# Patient Record
Sex: Male | Born: 1970 | Race: White | Hispanic: No | Marital: Married | Smoking: Former smoker
Health system: Southern US, Community
[De-identification: ages and names within clinical notes are randomized; demographics above are authoritative.]

## PROBLEM LIST (undated history)

## (undated) DIAGNOSIS — K219 Gastro-esophageal reflux disease without esophagitis: Secondary | ICD-10-CM

## (undated) DIAGNOSIS — E119 Type 2 diabetes mellitus without complications: Secondary | ICD-10-CM

## (undated) DIAGNOSIS — I10 Essential (primary) hypertension: Secondary | ICD-10-CM

## (undated) DIAGNOSIS — E7800 Pure hypercholesterolemia, unspecified: Secondary | ICD-10-CM

## (undated) DIAGNOSIS — K449 Diaphragmatic hernia without obstruction or gangrene: Secondary | ICD-10-CM

## (undated) DIAGNOSIS — K633 Ulcer of intestine: Secondary | ICD-10-CM

## (undated) DIAGNOSIS — F419 Anxiety disorder, unspecified: Secondary | ICD-10-CM

## (undated) HISTORY — DX: Ulcer of intestine: K63.3

## (undated) HISTORY — PX: UPPER GASTROINTESTINAL ENDOSCOPY: SHX188

## (undated) HISTORY — DX: Pure hypercholesterolemia, unspecified: E78.00

## (undated) HISTORY — DX: Type 2 diabetes mellitus without complications: E11.9

## (undated) HISTORY — DX: Gastro-esophageal reflux disease without esophagitis: K21.9

## (undated) HISTORY — PX: OTHER SURGICAL HISTORY: SHX169

## (undated) HISTORY — PX: WISDOM TOOTH EXTRACTION: SHX21

## (undated) HISTORY — DX: Essential (primary) hypertension: I10

## (undated) HISTORY — PX: EYE SURGERY: SHX253

## (undated) HISTORY — DX: Diaphragmatic hernia without obstruction or gangrene: K44.9

## (undated) HISTORY — DX: Anxiety disorder, unspecified: F41.9

## (undated) HISTORY — PX: POLYPECTOMY: SHX149

## (undated) HISTORY — PX: TONSILLECTOMY AND ADENOIDECTOMY: SUR1326

---

## 2011-06-19 ENCOUNTER — Emergency Department (HOSPITAL_COMMUNITY)
Admission: EM | Admit: 2011-06-19 | Discharge: 2011-06-19 | Disposition: A | Discharge status: Home / Self Care | Payer: Self-pay | Attending: Emergency Medicine | Admitting: Emergency Medicine

## 2011-06-19 ENCOUNTER — Other Ambulatory Visit: Payer: Self-pay

## 2011-06-19 ENCOUNTER — Encounter (HOSPITAL_COMMUNITY): Payer: Self-pay | Admitting: Emergency Medicine

## 2011-06-19 ENCOUNTER — Emergency Department (HOSPITAL_COMMUNITY): Payer: Self-pay

## 2011-06-19 DIAGNOSIS — F1010 Alcohol abuse, uncomplicated: Secondary | ICD-10-CM | POA: Insufficient documentation

## 2011-06-19 DIAGNOSIS — J45909 Unspecified asthma, uncomplicated: Secondary | ICD-10-CM | POA: Insufficient documentation

## 2011-06-19 DIAGNOSIS — R55 Syncope and collapse: Secondary | ICD-10-CM | POA: Insufficient documentation

## 2011-06-19 DIAGNOSIS — R112 Nausea with vomiting, unspecified: Secondary | ICD-10-CM | POA: Insufficient documentation

## 2011-06-19 DIAGNOSIS — Z79899 Other long term (current) drug therapy: Secondary | ICD-10-CM | POA: Insufficient documentation

## 2011-06-19 DIAGNOSIS — I10 Essential (primary) hypertension: Secondary | ICD-10-CM | POA: Insufficient documentation

## 2011-06-19 DIAGNOSIS — R404 Transient alteration of awareness: Secondary | ICD-10-CM | POA: Insufficient documentation

## 2011-06-19 LAB — CBC
HCT: 40.7 % (ref 39.0–52.0)
MCHC: 35.1 g/dL (ref 30.0–36.0)
MCV: 87.7 fL (ref 78.0–100.0)
RDW: 13.5 % (ref 11.5–15.5)

## 2011-06-19 LAB — DIFFERENTIAL
Basophils Absolute: 0 10*3/uL (ref 0.0–0.1)
Basophils Relative: 0 % (ref 0–1)
Eosinophils Relative: 0 % (ref 0–5)
Monocytes Absolute: 0.4 10*3/uL (ref 0.1–1.0)
Neutro Abs: 9.2 10*3/uL — ABNORMAL HIGH (ref 1.7–7.7)

## 2011-06-19 LAB — BASIC METABOLIC PANEL
Calcium: 8.4 mg/dL (ref 8.4–10.5)
Creatinine, Ser: 0.9 mg/dL (ref 0.50–1.35)
GFR calc Af Amer: >90 mL/min (ref >90)
GFR calc non Af Amer: >90 mL/min (ref >90)

## 2011-06-19 LAB — TROPONIN I: Troponin I: <0.3 ng/mL (ref <0.30)

## 2011-06-19 MED ORDER — ONDANSETRON HCL 4 MG/2ML IJ SOLN
INTRAMUSCULAR | Status: AC
  Filled 2011-06-19: qty 2

## 2011-06-19 MED ORDER — PROMETHAZINE HCL 25 MG/ML IJ SOLN
25.0000 mg | Freq: Once | INTRAMUSCULAR | Status: AC
  Administered 2011-06-19: 25 mg via INTRAVENOUS
  Filled 2011-06-19: qty 1

## 2011-06-19 MED ORDER — METOCLOPRAMIDE HCL 10 MG PO TABS: 10.0000 mg | ORAL_TABLET | Freq: Four times a day (QID) | ORAL | Status: DC

## 2011-06-19 MED ORDER — ONDANSETRON HCL 4 MG/2ML IJ SOLN
INTRAMUSCULAR | Status: AC
  Administered 2011-06-19: 4 mg
  Filled 2011-06-19: qty 2

## 2011-06-19 MED ORDER — SODIUM CHLORIDE 0.9 % IV BOLUS (SEPSIS)
1000.0000 mL | Freq: Once | INTRAVENOUS | Status: AC
  Administered 2011-06-19: 1000 mL via INTRAVENOUS

## 2011-06-19 MED ORDER — ONDANSETRON 4 MG PO TBDP: 4.0000 mg | ORAL_TABLET | Freq: Three times a day (TID) | ORAL | Status: DC | PRN

## 2011-06-19 MED ORDER — METOCLOPRAMIDE HCL 10 MG PO TABS: 10.0000 mg | ORAL_TABLET | Freq: Four times a day (QID) | ORAL | Status: AC

## 2011-06-19 MED ORDER — ONDANSETRON 4 MG PO TBDP: 4.0000 mg | ORAL_TABLET | Freq: Three times a day (TID) | ORAL | Status: AC | PRN

## 2011-06-19 NOTE — ED Notes (Signed)
Patient at buddies home, drinking and smoking cigars.  Patient states that he drank too much, smoked a cigar and now nauseated and vomiting.  Patient is awake, alert to name and place.  Patient smells of ETOH.

## 2011-06-19 NOTE — ED Notes (Signed)
Registration at [facility], wife into room.

## 2011-06-19 NOTE — ED Notes (Signed)
Patient complaining of nausea at this time.  MD notified.  New orders per Dr Hyacinth Meeker.

## 2011-06-19 NOTE — ED Provider Notes (Signed)
History     CSN: [id number]  Arrival date & time 06/19/11  0321   First MD Initiated Contact with Patient 06/19/11 [phone]      Chief Complaint  Patient presents with  . Alcohol Intoxication  . Loss of Consciousness    (Consider location/radiation/quality/duration/timing/severity/associated sxs/prior treatment) HPI Comments: 5 atrial male with a history of asthma and hypertension presents with a complaint of nausea and syncope. According to the patient he was drinking moonshine with his friends watching a basketball game and smoking a cigar when onlookers stated that he became nauseated vomited and had a syncopal episode. Currently he is awake and alert and complains only of persistent nausea. He denies chest pain, shortness of breath, swelling of the legs, abdominal pain, back pain, rash, headache or change in vision. He denies weakness or numbness. He has had no diarrhea. Symptoms were acute in onset, no one else at his part he felt similar symptoms.  The patient does report that he has been on an antibiotic for the last 2 days because of a chest cold.  Patient is a 41 y.o. male presenting with intoxication and syncope. The history is provided by the patient and the spouse.  Alcohol Intoxication  Loss of Consciousness    Past Medical History  Diagnosis Date  . Asthma     History reviewed. No pertinent past surgical history.  History reviewed. No pertinent family history.  History  Substance Use Topics  . Smoking status: Current Some Day Smoker    Types: Cigars, Cigarettes  . Smokeless tobacco: Not on file  . Alcohol Use: Yes      Review of Systems  Cardiovascular: Positive for syncope.  All other systems reviewed and are negative.    Allergies  Review of patient's allergies indicates no known allergies.  Home Medications   Current Outpatient Rx  Name Route Sig Dispense Refill  . ALPRAZOLAM 0.25 MG PO TABS Oral Take 0.25 mg by mouth daily as needed. For anxiety.     Marland Kitchen BENZONATATE 100 MG PO CAPS Oral Take 100-200 mg by mouth 3 (three) times daily as needed. For cough.    Marland Kitchen FLUTICASONE-SALMETEROL 45-21 MCG/ACT IN AERO Inhalation Inhale 2 puffs into the lungs 2 (two) times daily.    Marland Kitchen LISINOPRIL 40 MG PO TABS Oral Take 40 mg by mouth daily.    Marland Kitchen PANTOPRAZOLE SODIUM 40 MG PO TBEC Oral Take 40 mg by mouth 2 (two) times daily.    . SUCRALFATE 1 G PO TABS Oral Take 1 g by mouth 3 (three) times daily.    Marland Kitchen METOCLOPRAMIDE HCL 10 MG PO TABS Oral Take 1 tablet (10 mg total) by mouth every 6 (six) hours. 30 tablet 0  . ONDANSETRON 4 MG PO TBDP Oral Take 1 tablet (4 mg total) by mouth every 8 (eight) hours as needed for nausea. 10 tablet 0    BP 109/71  Pulse 83  Temp(Src) 97.5 F (36.4 C) (Oral)  Resp 13  Ht 6\' 1"  (1.854 m)  Wt 285 lb (129.275 kg)  BMI 37.60 kg/m2  SpO2 100%  Physical Exam  Nursing note and vitals reviewed. Constitutional: He appears well-developed and well-nourished. No distress.  HENT:  Head: Normocephalic and atraumatic.  Mouth/Throat: No oropharyngeal exudate.       Mucous membranes dry  Eyes: Conjunctivae and EOM are normal. Pupils are equal, round, and reactive to light. Right eye exhibits no discharge. Left eye exhibits no discharge. No scleral icterus.  Neck: Normal range  of motion. Neck supple. No JVD present. No thyromegaly present.  Cardiovascular: Normal rate, regular rhythm, normal heart sounds and intact distal pulses.  Exam reveals no gallop and no friction rub.   No murmur heard. Pulmonary/Chest: Effort normal and breath sounds normal. No respiratory distress. He has no wheezes. He has no rales.  Abdominal: Soft. Bowel sounds are normal. He exhibits no distension and no mass. There is no tenderness.       Obese but soft and nontender  Musculoskeletal: Normal range of motion. He exhibits no edema and no tenderness.  Lymphadenopathy:    He has no cervical adenopathy.  Neurological: He is alert. Coordination normal.    Skin: Skin is warm and dry. No rash noted. No erythema.  Psychiatric: He has a normal mood and affect. His behavior is normal.    ED Course  Procedures (including critical care time)  ED ECG REPORT   Date: 06/19/2011   Rate: 95  Rhythm: normal sinus rhythm  QRS Axis: normal  Intervals: normal  ST/T Wave abnormalities: normal  Conduction Disutrbances:none  Narrative Interpretation:   Old EKG Reviewed: none available   Labs Reviewed  CBC - Abnormal; Notable for the following:    WBC 12.0 (*)    All other components within normal limits  DIFFERENTIAL - Abnormal; Notable for the following:    Neutro Abs 9.2 (*)    All other components within normal limits  BASIC METABOLIC PANEL - Abnormal; Notable for the following:    CO2 17 (*)    Glucose, Bld 156 (*)    All other components within normal limits  TROPONIN I   Dg Chest 2 View  06/19/2011  *RADIOLOGY REPORT*  Clinical Data: Syncope, cough  CHEST - 2 VIEW  Comparison: None.  Findings: Cardiomegaly.  Elevated hemidiaphragms, right greater than left.  Interstitial prominence, may be exaggerated by low lung volume.  Otherwise, no focal area of consolidation.  No pleural effusion or pneumothorax.  No acute osseous abnormality.  IMPRESSION: Cardiomegaly.  Interstitial prominence without focal consolidation.  Original Report Authenticated By: Waneta Martins, M.D.     1. Nausea and vomiting   2. Syncope       MDM  Patient is normal neurologic exam, normal cardiac and pulmonary examinations EKG which was completely normal. We'll check electrolytes, CBC, troponin, chest x-ray. IV fluid bolus    The patient has improved slowly with fluids, Zofran and Reglan. Lab results showed normal metabolic panel, CO2 slightly low at 17, blood counts with white blood cells of 12.0, chest x-ray which is negative, troponin which was negative as well. I've encouraged the patient to follow up very closely with his doctor, follow up was given.  Followup instructions given to return to the ER.  Vida Roller, MD 06/19/11 [phone]

## 2011-06-19 NOTE — Discharge Instructions (Signed)
Please call the doctors below or your doctor in the morning for a recheck. Take Zofran or Reglan for nausea, return to the emergency department immediately for severe or worsening nausea, chest pain, passing out.  RESOURCE GUIDE  Dental Problems  Patients with Medicaid: [facility] [phone] [address]                                           [phone] [address] Phone:  [phone]                                                  Phone:  [phone]  If unable to pay or uninsured, contact:  [facility] or [facility]. to become qualified for the adult dental clinic.  Chronic Pain Problems Contact [facility]  [phone] Patients need to be referred by their primary care doctor.  Insufficient Money for Medicine Contact United Way:  call "211" or [facility] [phone].  No Primary Care Doctor Call [facility]  [phone] Other agencies that provide inexpensive medical care    [facility]  [phone]    [facility]  [phone]    [facility]  [phone]    [facility]  [phone]    [facility]  [phone]    [facility]  [phone]  Psychological Services [facility]  [phone] [facility]  [phone] [facility]   [phone] (emergency services [phone])  Substance Abuse Resources [facility]  [phone] [facility] [phone] [facility] [place] [phone] Floydene Flock [phone] Residential & Outpatient Substance Abuse Program  [phone]  Abuse/Neglect [facility] Child Abuse Hotline (780)[phone] [facility] Child Abuse Hotline 201-553-4596 (After Hours)  Emergency Shelter [facility] (928)593-6306  Maternity Homes Room at the [place] [facility] [place] [phone] [facility] [phone]  MRSA Hotline #:    680-482-1412    [facility] Resources  [facility]     United [employer]                          [facility] [address] [place]                       [address]      [address]  [place]                                                Cristobal Goldmann Phone:  [phone]  Phone:  [phone]                 Phone:  [phone]  [place] [facility] Phone:  [phone]  [place] Child Abuse Hotline (336) 342-1394 [phone]) [phone] (After Hours)   

## 2011-06-19 NOTE — ED Notes (Signed)
Patient with ETOH on board, watching basketball games this evening.  Bystanders state he did have a syncopal episode earlier in the evening, turning purple.  Patient's friends stated he lit a cigar and became sick to stomach with nausea and vomiting.  Patient is awake and alert to where he is.

## 2011-06-19 NOTE — ED Notes (Signed)
SPO2 dropping to 84% while resting/ sleeping, NAD, calm, interactive, skin red warm & dry, placed on O2 [place] 2L, new liter hung kvo.

## 2011-06-20 MED FILL — Ondansetron HCl Inj 4 MG/2ML (2 MG/ML): INTRAMUSCULAR | Qty: 2 | Status: AC

## 2012-03-07 ENCOUNTER — Ambulatory Visit (INDEPENDENT_AMBULATORY_CARE_PROVIDER_SITE_OTHER): Payer: BC Managed Care – PPO | Admitting: Internal Medicine

## 2012-03-07 ENCOUNTER — Encounter: Payer: Self-pay | Admitting: Internal Medicine

## 2012-03-07 VITALS — BP 172/118 | HR 92 | Temp 98.2°F | Ht 73.0 in | Wt 291.0 lb

## 2012-03-07 DIAGNOSIS — I10 Essential (primary) hypertension: Secondary | ICD-10-CM | POA: Insufficient documentation

## 2012-03-07 DIAGNOSIS — F4322 Adjustment disorder with anxiety: Secondary | ICD-10-CM

## 2012-03-07 DIAGNOSIS — K219 Gastro-esophageal reflux disease without esophagitis: Secondary | ICD-10-CM | POA: Insufficient documentation

## 2012-03-07 DIAGNOSIS — Z0000 Encounter for general adult medical examination without abnormal findings: Secondary | ICD-10-CM

## 2012-03-07 DIAGNOSIS — E669 Obesity, unspecified: Secondary | ICD-10-CM

## 2012-03-07 DIAGNOSIS — E785 Hyperlipidemia, unspecified: Secondary | ICD-10-CM | POA: Insufficient documentation

## 2012-03-07 MED ORDER — AMLODIPINE BESYLATE 5 MG PO TABS: 5.0000 mg | ORAL_TABLET | Freq: Every day | ORAL | Status: DC

## 2012-03-07 MED ORDER — PANTOPRAZOLE SODIUM 40 MG PO TBEC: 40.0000 mg | DELAYED_RELEASE_TABLET | Freq: Every day | ORAL | Status: DC

## 2012-03-07 MED ORDER — LOSARTAN POTASSIUM 100 MG PO TABS: 100.0000 mg | ORAL_TABLET | Freq: Every day | ORAL | Status: DC

## 2012-03-07 MED ORDER — ALPRAZOLAM 0.25 MG PO TABS: 0.2500 mg | ORAL_TABLET | Freq: Every day | ORAL | Status: DC | PRN

## 2012-03-07 NOTE — Assessment & Plan Note (Signed)
Continue low-dose alprazolam as needed. Patient understands to use medication sparingly.

## 2012-03-07 NOTE — Progress Notes (Signed)
Subjective:    Patient ID: Michael Jacobs, male    DOB: 07/14/1970, 41 y.o.   MRN: [id number]  HPI  41 year old white male with history of hypertension, GERD and obesity to establish. Patient previously followed by primary care doctors at [facility]. He recently moved to from [place] to [place] area in March 2013. Patient reports he was started on lisinopril by his previous primary care physician. He suffered with several months of dry hacking cough. He was seen at urgent care and was told his cough may be secondary to blood pressure medication. After stopping his blood pressure medication cough completely resolved.  He has been without blood pressure medication since. His blood pressure has been exacerbated by significant weight gain since he got married 2005.  He weighed approximately 220 pounds when he got married andover last several years his weight has been stable at around 290 pounds. He was able to lose approximately 30 pounds with following calorie restricted diet but unfortunately has regained most of his weight.  He complains of chronic fatigue. He reports history of loud snoring. He has there been tested for sleep apnea.  Review of Systems  Constitutional: Negative for activity change, appetite change  Eyes: Negative for visual disturbance.  Respiratory: Negative for cough, chest tightness and shortness of breath.   Cardiovascular: Negative for chest pain.  Genitourinary: Negative for difficulty urinating.  Neurological: Negative for headaches.  Gastrointestinal: Negative for abdominal pain, heartburn melena or hematochezia Psych: Negative for depression or anxiety Endo:  Positive for occasional erectile dysfunction (poor erection quality)     Past Medical History  Diagnosis Date  . Asthma     History   Social History  . Marital Status: Married    Spouse Name: N/A    Number of Children: N/A  . Years of Education: N/A   Occupational History  . Not on file.    Social History Main Topics  . Smoking status: Former Smoker    Types: Cigarettes, Cigars  . Smokeless tobacco: Not on file  . Alcohol Use: Yes  . Drug Use: No  . Sexually Active: Not on file   Other Topics Concern  . Not on file   Social History Narrative  . No narrative on file    No past surgical history on file.  No family history on file.  No Known Allergies  Current Outpatient Prescriptions on File Prior to Visit  Medication Sig Dispense Refill  . albuterol (PROVENTIL HFA;VENTOLIN HFA) 108 (90 BASE) MCG/ACT inhaler Inhale 2 puffs into the lungs every 6 (six) hours as needed.      . [DISCONTINUED] pantoprazole (PROTONIX) 40 MG tablet Take 40 mg by mouth 2 (two) times daily.      Marland Kitchen amLODipine (NORVASC) 5 MG tablet Take 1 tablet (5 mg total) by mouth daily.  90 tablet  1  . losartan (COZAAR) 100 MG tablet Take 1 tablet (100 mg total) by mouth daily.  90 tablet  1    BP 172/118  Pulse 92  Temp 98.2 F (36.8 C) (Oral)  Ht 6\' 1"  (1.854 m)  Wt 291 lb (131.997 kg)  BMI 38.39 kg/m2       Objective:   Physical Exam  Constitutional: He is oriented to person, place, and time.  HENT:  Head: Normocephalic and atraumatic.  Right Ear: External ear normal.  Left Ear: External ear normal.  Mouth/Throat: Oropharynx is clear and moist.  Eyes: EOM are normal. Pupils are equal, round, and reactive to light.  Neck: Neck supple.       No carotid bruit  Cardiovascular: Normal rate, regular rhythm, normal heart sounds and intact distal pulses.   No murmur heard. Pulmonary/Chest: Effort normal and breath sounds normal. He has no wheezes.  Abdominal: Soft. Bowel sounds are normal. There is no tenderness.  Musculoskeletal: He exhibits no edema.  Lymphadenopathy:    He has no cervical adenopathy.  Neurological: He is alert and oriented to person, place, and time. No cranial nerve deficit.  Skin: Skin is warm and dry.  Psychiatric: He has a normal mood and affect. His behavior  is normal.          Assessment & Plan:

## 2012-03-07 NOTE — Assessment & Plan Note (Signed)
Patient has history of ACE inhibitor cough. His was previously taking lisinopril. I recommend a combination therapy. Start losartan 100 mg amlodipine 5 mg. Patient advised to monitor blood pressure at home. Obtain screening labs. BP: 172/118 mmHg

## 2012-03-07 NOTE — Assessment & Plan Note (Signed)
Patient has chronic gastroesophageal reflux disease. Patient encouraged to work on weight loss. He does not have any alarm symptoms. Continue protonix for now.

## 2012-03-07 NOTE — Assessment & Plan Note (Signed)
Reviewed adult health maintenance protocols.  We discussed at length the need for weight loss. Weight loss strategies discussed. Patient to restrict his caloric intake to 2000 - 2100 calories per day. Screen for diabetes. Patient also has several risk factors for obstructive sleep apnea.

## 2012-03-09 ENCOUNTER — Other Ambulatory Visit (INDEPENDENT_AMBULATORY_CARE_PROVIDER_SITE_OTHER): Payer: BC Managed Care – PPO

## 2012-03-09 DIAGNOSIS — E669 Obesity, unspecified: Secondary | ICD-10-CM

## 2012-03-09 DIAGNOSIS — E785 Hyperlipidemia, unspecified: Secondary | ICD-10-CM

## 2012-03-09 DIAGNOSIS — I10 Essential (primary) hypertension: Secondary | ICD-10-CM

## 2012-03-09 LAB — HEMOGLOBIN A1C: Hgb A1c MFr Bld: 7.2 % — ABNORMAL HIGH (ref 4.6–6.5)

## 2012-03-09 LAB — CBC WITH DIFFERENTIAL/PLATELET
Basophils Relative: 0.2 % (ref 0.0–3.0)
Eosinophils Absolute: 0.1 10*3/uL (ref 0.0–0.7)
Eosinophils Relative: 1.1 % (ref 0.0–5.0)
Lymphocytes Relative: 43 % (ref 12.0–46.0)
MCHC: 33.2 g/dL (ref 30.0–36.0)
Neutrophils Relative %: 49.8 % (ref 43.0–77.0)
RBC: 5.11 Mil/uL (ref 4.22–5.81)
WBC: 9.1 10*3/uL (ref 4.5–10.5)

## 2012-03-09 LAB — BASIC METABOLIC PANEL
Calcium: 9.7 mg/dL (ref 8.4–10.5)
Creatinine, Ser: 0.9 mg/dL (ref 0.4–1.5)

## 2012-03-09 LAB — TSH: TSH: 2.64 u[IU]/mL (ref 0.35–5.50)

## 2012-03-09 LAB — LIPID PANEL
HDL: 33 mg/dL — ABNORMAL LOW (ref >39.00)
Total CHOL/HDL Ratio: 6

## 2012-03-09 LAB — HEPATIC FUNCTION PANEL
Alkaline Phosphatase: 94 U/L (ref 39–117)
Bilirubin, Direct: 0.2 mg/dL (ref 0.0–0.3)
Total Bilirubin: 1 mg/dL (ref 0.3–1.2)
Total Protein: 6.9 g/dL (ref 6.0–8.3)

## 2012-03-13 ENCOUNTER — Telehealth: Payer: Self-pay | Admitting: Internal Medicine

## 2012-03-13 DIAGNOSIS — IMO0001 Reserved for inherently not codable concepts without codable children: Secondary | ICD-10-CM

## 2012-03-13 MED ORDER — PRAVASTATIN SODIUM 40 MG PO TABS: 40.0000 mg | ORAL_TABLET | Freq: Every day | ORAL | Status: DC

## 2012-03-13 MED ORDER — METFORMIN HCL 500 MG PO TABS: 500.0000 mg | ORAL_TABLET | Freq: Two times a day (BID) | ORAL | Status: DC

## 2012-03-13 NOTE — Telephone Encounter (Signed)
Patient's recent blood work was reviewed in detail. He has new onset type 2 diabetes. He is in between jobs and has lapse in her health insurance. He was advised start low carbohydrate diet and regular exercise program.  we'll also start metformin 500 mg twice daily and pravastatin 40 mg once daily. He'll return in 2 months for followup visit. Patient advised to obtain hemoglobin A1c, BMET, fasting lipid panel and LFTs before next office visit.

## 2012-04-17 ENCOUNTER — Ambulatory Visit: Payer: Self-pay | Admitting: Internal Medicine

## 2012-04-17 DIAGNOSIS — Z0289 Encounter for other administrative examinations: Secondary | ICD-10-CM

## 2012-05-26 ENCOUNTER — Other Ambulatory Visit: Payer: Self-pay

## 2012-07-12 ENCOUNTER — Telehealth: Payer: Self-pay | Admitting: Internal Medicine

## 2012-07-12 MED ORDER — METFORMIN HCL 500 MG PO TABS: 500.0000 mg | ORAL_TABLET | Freq: Two times a day (BID) | ORAL | Status: DC

## 2012-07-12 NOTE — Telephone Encounter (Signed)
Pt would like to switch from metFORMIN (GLUCOPHAGE) 500 MG tablet to GLYBURIDE.  Pt states is is very much less expensive for him. Pharm: [facility]  90 day refills Pt would like to move all meds to [facility] from now on. Pt has follow up appt on Fri July 20, 2012.  Pls inform pt of the metformin decision   [facility] RX INFO New RX bin #;   [id number]     PCN: [id number]   [facility]

## 2012-07-12 NOTE — Telephone Encounter (Signed)
Left message for pt to call back  °

## 2012-07-12 NOTE — Telephone Encounter (Signed)
Pt already has an appt scheduled, rx sent in to [facility] on [address]

## 2012-07-12 NOTE — Telephone Encounter (Signed)
No, I do not recommend we change medication.  Metformin in free at [facility]. Pt needs to have labs completed and schedule f/u OV

## 2012-07-20 ENCOUNTER — Encounter: Payer: Self-pay | Admitting: Internal Medicine

## 2012-07-20 ENCOUNTER — Ambulatory Visit (INDEPENDENT_AMBULATORY_CARE_PROVIDER_SITE_OTHER): Payer: 59 | Admitting: Internal Medicine

## 2012-07-20 VITALS — BP 132/90 | HR 84 | Temp 98.0°F | Wt 274.0 lb

## 2012-07-20 DIAGNOSIS — I10 Essential (primary) hypertension: Secondary | ICD-10-CM

## 2012-07-20 DIAGNOSIS — E119 Type 2 diabetes mellitus without complications: Secondary | ICD-10-CM

## 2012-07-20 DIAGNOSIS — E785 Hyperlipidemia, unspecified: Secondary | ICD-10-CM

## 2012-07-20 MED ORDER — LOSARTAN POTASSIUM 100 MG PO TABS: 100.0000 mg | ORAL_TABLET | Freq: Every day | ORAL | Status: DC

## 2012-07-20 MED ORDER — PRAVASTATIN SODIUM 40 MG PO TABS: 40.0000 mg | ORAL_TABLET | Freq: Every day | ORAL | Status: DC

## 2012-07-20 MED ORDER — METFORMIN HCL 500 MG PO TABS: 500.0000 mg | ORAL_TABLET | Freq: Two times a day (BID) | ORAL | Status: DC

## 2012-07-20 MED ORDER — PANTOPRAZOLE SODIUM 40 MG PO TBEC: 40.0000 mg | DELAYED_RELEASE_TABLET | Freq: Every day | ORAL | Status: DC

## 2012-07-20 MED ORDER — AMLODIPINE BESYLATE 5 MG PO TABS: 5.0000 mg | ORAL_TABLET | Freq: Every day | ORAL | Status: DC

## 2012-07-20 NOTE — Assessment & Plan Note (Signed)
Patient tolerating pravastatin. Arrange fasting lipid panel and LFTs before next office visit.

## 2012-07-20 NOTE — Progress Notes (Signed)
  Subjective:    Patient ID: Michael Jacobs, male    DOB: 1971-02-09, 42 y.o.   MRN: [id number]  HPI  42 year old white male with history of hypertension and new onset type 2 diabetes for followup. Patient's A1c was elevated at 7.2. Patient started on metformin. He is experiencing occasional nausea/queasiness. He has lost 17 pounds since previous visit. He is following low-carb diet and exercising on a regular basis.  Patient also started on pravastatin. He denies any adverse effects.  Review of Systems Negative for chest pain  Past Medical History  Diagnosis Date  . Asthma     History   Social History  . Marital Status: Married    Spouse Name: N/A    Number of Children: N/A  . Years of Education: N/A   Occupational History  . Not on file.   Social History Main Topics  . Smoking status: Former Smoker    Types: Cigarettes, Cigars  . Smokeless tobacco: Not on file  . Alcohol Use: Yes  . Drug Use: No  . Sexually Active: Not on file   Other Topics Concern  . Not on file   Social History Narrative  . No narrative on file    No past surgical history on file.  No family history on file.  Allergies  Allergen Reactions  . Ace Inhibitors Cough    Current Outpatient Prescriptions on File Prior to Visit  Medication Sig Dispense Refill  . albuterol (PROVENTIL HFA;VENTOLIN HFA) 108 (90 BASE) MCG/ACT inhaler Inhale 2 puffs into the lungs every 6 (six) hours as needed.      . ALPRAZolam (XANAX) 0.25 MG tablet Take 1 tablet (0.25 mg total) by mouth daily as needed for sleep or anxiety.  30 tablet  3   No current facility-administered medications on file prior to visit.    BP 132/90  Pulse 84  Temp(Src) 98 F (36.7 C) (Oral)  Wt 274 lb (124.286 kg)  BMI 36.16 kg/m2         Objective:   Physical Exam  Constitutional: He is oriented to person, place, and time. He appears well-developed and well-nourished.  Cardiovascular: Normal rate, regular rhythm and normal heart  sounds.   Pulmonary/Chest: Effort normal and breath sounds normal. He has no wheezes.  Neurological: He is alert and oriented to person, place, and time. No cranial nerve deficit.  Psychiatric: He has a normal mood and affect. His behavior is normal.          Assessment & Plan:

## 2012-07-20 NOTE — Patient Instructions (Signed)
Please complete the following lab tests before your next follow up appointment: BMET, A1c - 250.00 FLP, LFTs - 272.4 

## 2012-07-20 NOTE — Assessment & Plan Note (Signed)
Patient recently diagnosed with new onset type 2 diabetes. His hemoglobin A1c is 7.2. He is tolerating metformin.  He has mild nausea/queasiness. Patient advised to take metformin with meals. Continue low-carb diet and regular exercise. Reassess in 2 months. Lab Results  Component Value Date   HGBA1C 7.2* 03/09/2012

## 2012-07-20 NOTE — Assessment & Plan Note (Signed)
Blood pressure significantly improved. His diastolic blood pressure still suboptimal. I suspect it should improve with further weight loss. Continue same dose of losartan and amlodipine. BP: 132/90 mmHg

## 2012-09-04 ENCOUNTER — Other Ambulatory Visit: Payer: 59

## 2012-09-10 ENCOUNTER — Ambulatory Visit: Payer: 59 | Admitting: Internal Medicine

## 2012-09-14 ENCOUNTER — Telehealth: Payer: Self-pay | Admitting: *Deleted

## 2012-09-14 MED ORDER — AMLODIPINE BESYLATE 5 MG PO TABS: 5.0000 mg | ORAL_TABLET | Freq: Every day | ORAL | Status: DC

## 2012-09-14 NOTE — Telephone Encounter (Signed)
rx called in

## 2012-09-14 NOTE — Telephone Encounter (Signed)
Refill alprazolam 0.25mg  1 tab qhs.  Last refilled at [facility] on 07/12/12 #30

## 2012-09-14 NOTE — Telephone Encounter (Signed)
Ok to refill x 2  

## 2012-10-08 ENCOUNTER — Other Ambulatory Visit (INDEPENDENT_AMBULATORY_CARE_PROVIDER_SITE_OTHER): Payer: 59

## 2012-10-08 DIAGNOSIS — I10 Essential (primary) hypertension: Secondary | ICD-10-CM

## 2012-10-08 DIAGNOSIS — E1110 Type 2 diabetes mellitus with ketoacidosis without coma: Secondary | ICD-10-CM

## 2012-10-08 LAB — HEPATIC FUNCTION PANEL
Albumin: 4 g/dL (ref 3.5–5.2)
Total Bilirubin: 0.9 mg/dL (ref 0.3–1.2)

## 2012-10-08 LAB — BASIC METABOLIC PANEL
BUN: 17 mg/dL (ref 6–23)
CO2: 26 mEq/L (ref 19–32)
Chloride: 109 mEq/L (ref 96–112)
Creatinine, Ser: 1.1 mg/dL (ref 0.4–1.5)
Glucose, Bld: 117 mg/dL — ABNORMAL HIGH (ref 70–99)

## 2012-10-08 LAB — LDL CHOLESTEROL, DIRECT: Direct LDL: 97.2 mg/dL

## 2012-10-08 LAB — LIPID PANEL
Cholesterol: 166 mg/dL (ref 0–200)
Total CHOL/HDL Ratio: 5
VLDL: 50 mg/dL — ABNORMAL HIGH (ref 0.0–40.0)

## 2012-10-10 ENCOUNTER — Telehealth: Payer: Self-pay | Admitting: Internal Medicine

## 2012-10-10 NOTE — Telephone Encounter (Signed)
See result note.  

## 2012-10-10 NOTE — Telephone Encounter (Signed)
PT is calling to request lab results from 6/30. Please assist.

## 2012-10-15 ENCOUNTER — Encounter: Payer: Self-pay | Admitting: Family Medicine

## 2012-10-15 ENCOUNTER — Ambulatory Visit (INDEPENDENT_AMBULATORY_CARE_PROVIDER_SITE_OTHER): Payer: 59 | Admitting: Family Medicine

## 2012-10-15 VITALS — BP 130/84 | HR 85 | Temp 98.3°F | Wt 270.0 lb

## 2012-10-15 DIAGNOSIS — E119 Type 2 diabetes mellitus without complications: Secondary | ICD-10-CM

## 2012-10-15 DIAGNOSIS — I10 Essential (primary) hypertension: Secondary | ICD-10-CM

## 2012-10-15 DIAGNOSIS — E785 Hyperlipidemia, unspecified: Secondary | ICD-10-CM

## 2012-10-15 MED ORDER — SILDENAFIL CITRATE 100 MG PO TABS: 100.0000 mg | ORAL_TABLET | ORAL | Status: DC | PRN

## 2012-10-15 NOTE — Progress Notes (Signed)
  Subjective:    Patient ID: Michael Jacobs, male    DOB: 1970/10/31, 42 y.o.   MRN: [id number]  HPI Here to follow up and for other issues. He has been working out and losing weight. He feels much better in general. His BP at home is stable in the 120s over 80s. His glucoses range 80-100 fasting. He asks about see ing a Nutritionist for advice. He says he has had pain in the right elbow for one month. He lifts weights, and this makes the pain worse.    Review of Systems  Constitutional: Negative.   Respiratory: Negative.   Cardiovascular: Negative.        Objective:   Physical Exam  Constitutional: He appears well-developed and well-nourished.  Cardiovascular: Normal rate, regular rhythm, normal heart sounds and intact distal pulses.   Pulmonary/Chest: Effort normal and breath sounds normal.  Musculoskeletal:  Tender over the right lateral epicondyle, no swelling          Assessment & Plan:  His diabetes, lipids and HTN are all well controlled. We will refer to Nutrition. Rest the elbow and avoid any weightlifting for at least 6 weeks. Use ice packs.

## 2012-10-18 ENCOUNTER — Ambulatory Visit: Payer: 59 | Admitting: Internal Medicine

## 2012-10-18 ENCOUNTER — Telehealth: Payer: Self-pay | Admitting: Internal Medicine

## 2012-10-23 NOTE — Telephone Encounter (Signed)
Pt needs pre approval for this referral nutrition visits, per Mrs Gerilyn Pilgrim @ [facility]

## 2012-12-06 ENCOUNTER — Ambulatory Visit (INDEPENDENT_AMBULATORY_CARE_PROVIDER_SITE_OTHER): Payer: 59 | Admitting: Family Medicine

## 2012-12-06 ENCOUNTER — Encounter: Payer: Self-pay | Admitting: Family Medicine

## 2012-12-06 VITALS — Ht 73.0 in | Wt 278.5 lb

## 2012-12-06 DIAGNOSIS — E785 Hyperlipidemia, unspecified: Secondary | ICD-10-CM

## 2012-12-06 DIAGNOSIS — I10 Essential (primary) hypertension: Secondary | ICD-10-CM

## 2012-12-06 DIAGNOSIS — E119 Type 2 diabetes mellitus without complications: Secondary | ICD-10-CM

## 2012-12-06 NOTE — Progress Notes (Signed)
Medical Nutrition Therapy:  Appt start time: 1400 end time:  1500.  Assessment:  Primary concerns today: Blood sugar control and BP and cholesterol management.  Tammy Sours wants to know what foods he likes that he can eat that will work for both his bp and BG.  He feels like many foods he likes that are ok w/ his BG tend to be high in Na.  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    Usual eating pattern includes 2-3 meals and 2 snacks per day. Tammy Sours lost 30 lb (to 266 lb) from Dec 2013-Mar 2014 "by starving and doing a lot of exercise."  He got tired of chicken, veg's, and salad only.  Current usual physical activity includes none.     Has not checked BG since a month ago; had been staying between 70 & 120.  Currently doing glucophage 500 mg 1 X day.   Frequent foods include flavored artif sweetened waters, 32 oz diet colas, ~20 oz all other diet diet drinks.  Avoided foods include coconut-based [place] food, cantaloupe (disliked).    Did not do 24-hr recall b/c travelling from [place] all day: Typical day: Usually up ~8:30 or 9:  B (10 AM)-   -3 hb'd eggs (sometimes w/ cheese), flavored water (occas'ly 16 o.j.) Snk ( AM)-   diet soda/drink L ( PM)-  diet soda/drink Snk (3 PM)-  ~ 2 oz deli meat & 2 oz cheese, diet drink D (7 PM)-  chx/meat/fish w/ veg's, occasionally low-CHO pasta, diet drink Snk ( PM)-  Possibly meat & cheese rollup  Progress Towards Goal(s):  In progress.   Nutritional Diagnosis:  NI-5.8.5 Inadeqate fiber intake As related to low intake of fruit and vegetables.  As evidenced by usual intake of no fruit and vegetables limited to once a day.    Intervention:  Nutrition education.  Monitoring/Evaluation:  Dietary intake, exercise, and body weight in 5 week(s).

## 2012-12-06 NOTE — Patient Instructions (Addendum)
Diet Recommendations for Diabetes   Starchy (carb) foods include: Bread, rice, pasta, potatoes, corn, crackers, bagels, muffins, all baked goods.  Fruit, yogurt, and milk also have carb's, but these foods don't raise your BG as much as the above starchy foods. (Bananas, grapes, pineapple, & watermelon need to be consumed in moderate amounts and less frequently than other fruits.)  Protein foods include: Meat, fish, poultry, eggs, dairy foods, and beans such as pinto and kidney beans (beans also provide carbohydrate).   1. Eat at least 3 meals and 1-2 snacks per day. Never go more than 4-5 hours while awake without eating.  2. Limit starchy foods to TWO per meal and ONE per snack. ONE portion of a starchy  food is equal to the following:   - ONE slice of bread (or its equivalent, such as half of a hamburger bun).   - 1/2 cup of a "scoopable" starchy food such as potatoes or rice.   - 15 grams of carbohydrate as shown on food label.  3. Both lunch and dinner should include a protein food, a carb food, and vegetables.   - Obtain twice as many veg's as protein or carbohydrate foods for both lunch and dinner.   - Try to keep frozen veg's on hand for a quick vegetable serving.     - Fresh or frozen veg's are best.   - Breakfast should include protein (i.e., 2 eggs) and SOME carb, i.e., fruit, whole-wheat toast, steel-cut oats.  - TASTE PREFERENCES ARE LEARNED.  This means that it will get easier to choose foods you know are good for you if you are exposed to them enough.   - You will be well served to learn to like water better.  (Try Googling refreshing summer drinks, i.e., cucumbers, citrus, berries.)  - Salads: Try dressing on the side, and dip your fork in the dressing before the lettuce.  - Choose low-sodium and no-salt-added foods and products.   - Choose the leanest meats you can find.  All non-starchy veg's are unlimited (summer squash, tomatoes, onions, garlic, broccoli, cauliflower, green  beans, ALL leafy greens, eggplant.)  - Try sauteeing greens in olive oil & garlic.  [[employer] carries 2-lb bag of pre-washed baby kale.] - The key to the best foods for your blood pressure and blood glucose is to choose mostly UNprocessed or minimally processed foods.

## 2013-01-09 ENCOUNTER — Ambulatory Visit: Payer: 59 | Admitting: Family Medicine

## 2013-01-15 ENCOUNTER — Ambulatory Visit: Payer: 59 | Admitting: Internal Medicine

## 2013-02-14 ENCOUNTER — Ambulatory Visit: Payer: 59 | Admitting: Internal Medicine

## 2013-02-14 ENCOUNTER — Other Ambulatory Visit: Payer: Self-pay

## 2013-02-25 ENCOUNTER — Ambulatory Visit: Payer: 59 | Admitting: Internal Medicine

## 2013-03-13 ENCOUNTER — Other Ambulatory Visit: Payer: Self-pay | Admitting: Internal Medicine

## 2013-03-20 ENCOUNTER — Encounter: Payer: Self-pay | Admitting: Internal Medicine

## 2013-03-20 ENCOUNTER — Ambulatory Visit (INDEPENDENT_AMBULATORY_CARE_PROVIDER_SITE_OTHER): Payer: 59 | Admitting: Internal Medicine

## 2013-03-20 VITALS — BP 148/102 | HR 76 | Temp 97.9°F | Ht 73.0 in | Wt 284.0 lb

## 2013-03-20 DIAGNOSIS — I10 Essential (primary) hypertension: Secondary | ICD-10-CM

## 2013-03-20 DIAGNOSIS — Z23 Encounter for immunization: Secondary | ICD-10-CM

## 2013-03-20 DIAGNOSIS — E119 Type 2 diabetes mellitus without complications: Secondary | ICD-10-CM

## 2013-03-20 MED ORDER — PRAVASTATIN SODIUM 40 MG PO TABS: 40.0000 mg | ORAL_TABLET | Freq: Every day | ORAL | Status: DC

## 2013-03-20 MED ORDER — VALSARTAN 160 MG PO TABS: 160.0000 mg | ORAL_TABLET | Freq: Every day | ORAL | Status: DC

## 2013-03-20 MED ORDER — METFORMIN HCL 500 MG PO TABS: 500.0000 mg | ORAL_TABLET | Freq: Three times a day (TID) | ORAL | Status: DC

## 2013-03-20 MED ORDER — AMLODIPINE BESYLATE 5 MG PO TABS: 5.0000 mg | ORAL_TABLET | Freq: Every day | ORAL | Status: DC

## 2013-03-20 NOTE — Assessment & Plan Note (Addendum)
Blood pressure is suboptimally controlled with valsartan. Discontinue valsartan. Replace with valsartan 160 mg. Continue same dose of amlodipine 5 mg. BP: 148/102 mmHg  Continue weight loss efforts.

## 2013-03-20 NOTE — Progress Notes (Signed)
   Subjective:    Patient ID: Michael Jacobs, male    DOB: 09/04/1970, 42 y.o.   MRN: [id number]  HPI  42 year old white male with history of asthma, type 2 diabetes and hyperlipidemia for followup. Overall patient reports good dietary compliance. He is still working on weight loss. Patient monitors his blood pressure at home. His morning readings are elevated despite taking losartan 100 mg once daily and amlodipine 5 mg once daily.   Review of Systems Negative for chest pain or shortness of breath    Past Medical History  Diagnosis Date  . Asthma     History   Social History  . Marital Status: Married    Spouse Name: N/A    Number of Children: N/A  . Years of Education: N/A   Occupational History  . Not on file.   Social History Main Topics  . Smoking status: Former Smoker    Types: Cigarettes, Cigars  . Smokeless tobacco: Never Used  . Alcohol Use: Yes     Comment: occ  . Drug Use: No  . Sexual Activity: Not on file   Other Topics Concern  . Not on file   Social History Narrative  . No narrative on file    No past surgical history on file.  No family history on file.  Allergies  Allergen Reactions  . Ace Inhibitors Cough    Current Outpatient Prescriptions on File Prior to Visit  Medication Sig Dispense Refill  . albuterol (PROVENTIL HFA;VENTOLIN HFA) 108 (90 BASE) MCG/ACT inhaler Inhale 2 puffs into the lungs every 6 (six) hours as needed.      . ALPRAZolam (XANAX) 0.25 MG tablet Take 1 tablet (0.25 mg total) by mouth daily as needed for sleep or anxiety.  30 tablet  3  . pantoprazole (PROTONIX) 40 MG tablet TAKE 1 TABLET (40 MG TOTAL) BY MOUTH DAILY.  30 tablet  0   No current facility-administered medications on file prior to visit.    BP 148/102  Pulse 76  Temp(Src) 97.9 F (36.6 C) (Oral)  Ht 6\' 1"  (1.854 m)  Wt 284 lb (128.822 kg)  BMI 37.48 kg/m2    Objective:   Physical Exam  Constitutional: He is oriented to person, place, and time.  He appears well-developed and well-nourished. No distress.  HENT:  Head: Normocephalic and atraumatic.  Cardiovascular: Normal rate, regular rhythm and normal heart sounds.   No murmur heard. Pulmonary/Chest: Effort normal and breath sounds normal.  Abdominal: Soft. Bowel sounds are normal. There is no tenderness.  Musculoskeletal:  Trace lower extremity edema  Neurological: He is alert and oriented to person, place, and time. No cranial nerve deficit.  Skin: Skin is warm and dry.  See diabetic foot exam  Psychiatric: He has a normal mood and affect. His behavior is normal.          Assessment & Plan:

## 2013-03-20 NOTE — Patient Instructions (Addendum)
Please complete the following lab tests before your next follow up appointment: BMET - 401.9 A1c, microalb / cr ratio - 250.00

## 2013-03-20 NOTE — Progress Notes (Signed)
Pre visit review using our clinic review tool, if applicable. No additional management support is needed unless otherwise documented below in the visit note. 

## 2013-03-20 NOTE — Assessment & Plan Note (Signed)
Patient's A1c improving. Increase metformin to 500 mg 3 times a day with meals. Continue low-carb diet regular exercise. Lab Results  Component Value Date   HGBA1C 6.0 10/08/2012   Lab Results  Component Value Date   CREATININE 1.1 10/08/2012

## 2013-03-26 ENCOUNTER — Telehealth: Payer: Self-pay | Admitting: Internal Medicine

## 2013-03-26 NOTE — Telephone Encounter (Signed)
I received a fax from [facility] stating pt needs to have tried an failed Brand Diovan before they will approve Generic Valsartan.

## 2013-03-27 MED ORDER — DIOVAN 160 MG PO TABS: 160.0000 mg | ORAL_TABLET | Freq: Every day | ORAL | Status: DC

## 2013-03-27 NOTE — Telephone Encounter (Signed)
rx was sent in electronically to [facility] for Brand Diovan

## 2013-03-27 NOTE — Telephone Encounter (Signed)
Seems illogical but ok to change rx to brand diovan.  Same dose.

## 2013-04-06 ENCOUNTER — Other Ambulatory Visit: Payer: Self-pay | Admitting: Internal Medicine

## 2013-04-11 HISTORY — PX: COLONOSCOPY: SHX174

## 2013-04-20 ENCOUNTER — Other Ambulatory Visit: Payer: Self-pay | Admitting: Internal Medicine

## 2013-04-22 ENCOUNTER — Other Ambulatory Visit: Payer: 59

## 2013-04-26 ENCOUNTER — Ambulatory Visit: Payer: 59 | Admitting: Internal Medicine

## 2013-06-26 ENCOUNTER — Telehealth: Payer: Self-pay | Admitting: Internal Medicine

## 2013-06-26 MED ORDER — ALPRAZOLAM 0.25 MG PO TABS: 0.2500 mg | ORAL_TABLET | Freq: Every day | ORAL | Status: DC | PRN

## 2013-06-26 NOTE — Telephone Encounter (Signed)
Ok to RF x 2 

## 2013-06-26 NOTE — Telephone Encounter (Signed)
[place], [place] requesting re-fill on ALPRAZolam (XANAX) 0.25 MG tablet

## 2013-06-26 NOTE — Telephone Encounter (Signed)
rx sent in called into [facility]

## 2013-07-18 ENCOUNTER — Other Ambulatory Visit: Payer: Self-pay

## 2013-08-14 ENCOUNTER — Telehealth: Payer: Self-pay | Admitting: Internal Medicine

## 2013-08-14 MED ORDER — ALBUTEROL SULFATE HFA 108 (90 BASE) MCG/ACT IN AERS: 2.0000 | INHALATION_SPRAY | Freq: Four times a day (QID) | RESPIRATORY_TRACT | Status: DC | PRN

## 2013-08-14 NOTE — Telephone Encounter (Signed)
rx sent in electronically 

## 2013-08-14 NOTE — Telephone Encounter (Signed)
[facility] - [place],  - [address] is requesting re-fill on albuterol (PROVENTIL HFA;VENTOLIN HFA) 108 (90 BASE) MCG/ACT inhaler °

## 2013-08-20 ENCOUNTER — Ambulatory Visit (INDEPENDENT_AMBULATORY_CARE_PROVIDER_SITE_OTHER): Payer: 59 | Admitting: Physician Assistant

## 2013-08-20 ENCOUNTER — Encounter: Payer: Self-pay | Admitting: Physician Assistant

## 2013-08-20 VITALS — BP 130/90 | HR 108 | Temp 98.2°F | Resp 18 | Wt 281.0 lb

## 2013-08-20 DIAGNOSIS — J019 Acute sinusitis, unspecified: Secondary | ICD-10-CM

## 2013-08-20 MED ORDER — AMOXICILLIN-POT CLAVULANATE 875-125 MG PO TABS: 1.0000 | ORAL_TABLET | Freq: Two times a day (BID) | ORAL | Status: DC

## 2013-08-20 NOTE — Progress Notes (Signed)
Pre visit review using our clinic review tool, if applicable. No additional management support is needed unless otherwise documented below in the visit note. 

## 2013-08-20 NOTE — Patient Instructions (Signed)
Augmentin twice daily for 10 days for sinusitis.  Plain Over the Counter Mucinex (NOT Mucinex D) for thick secretions  Force NON dairy fluids, drinking plenty of water is best.    Over the Counter Flonase OR Nasacort AQ 1 spray in each nostril twice a day as needed. Use the "crossover" technique into opposite nostril spraying toward opposite ear @ 45 degree angle, not straight up into nostril.   Saline Irrigation and Saline Sprays can also help reduce symptoms.  Follow up in 1 week to reassess, or sooner if symptoms persist or worsen despite treatment.  Sinusitis Sinusitis is redness, soreness, and puffiness (inflammation) of the air pockets in the bones of your face (sinuses). The redness, soreness, and puffiness can cause air and mucus to get trapped in your sinuses. This can allow germs to grow and cause an infection.  HOME CARE   Drink enough fluids to keep your pee (urine) clear or pale yellow.  Use a humidifier in your home.  Run a hot shower to create steam in the bathroom. Sit in the bathroom with the door closed. Breathe in the steam 3 4 times a day.  Put a warm, moist washcloth on your face 3 4 times a day, or as told by your doctor.  Use salt water sprays (saline sprays) to wet the thick fluid in your nose. This can help the sinuses drain.  Only take medicine as told by your doctor. GET HELP RIGHT AWAY IF:   Your pain gets worse.  You have very bad headaches.  You are sick to your stomach (nauseous).  You throw up (vomit).  You are very sleepy (drowsy) all the time.  Your face is puffy (swollen).  Your vision changes.  You have a stiff neck.  You have trouble breathing. MAKE SURE YOU:   Understand these instructions.  Will watch your condition.  Will get help right away if you are not doing well or get worse. Document Released: 09/14/2007 Document Revised: 12/21/2011 Document Reviewed: 11/01/2011 [facility] Patient Information 2014 [place].

## 2013-08-20 NOTE — Progress Notes (Signed)
Subjective:    Patient ID: Michael Jacobs, male    DOB: 03-14-1971, 43 y.o.   MRN: [id number]  Sinusitis This is a new problem. The current episode started 1 to 4 weeks ago (Pt was initially sick with bronchitis, saw Urgent care last week, and was prescribed a Z pack. Pt states this helped his lungs, but now his symptoms are in his head and sinuses instead.). The problem is unchanged. There has been no fever. The pain is moderate. Associated symptoms include chills, congestion, coughing (brown and yellow sputum.), diaphoresis, ear pain, a hoarse voice, sinus pressure and a sore throat. Pertinent negatives include no headaches, neck pain, shortness of breath, sneezing or swollen glands. Treatments tried: Mucinex and OTC cold and flu medicine. The treatment provided no relief.     Review of Systems  Constitutional: Positive for chills and diaphoresis.  HENT: Positive for congestion, ear pain, hoarse voice, sinus pressure and sore throat. Negative for sneezing.   Respiratory: Positive for cough (brown and yellow sputum.). Negative for shortness of breath.   Cardiovascular: Negative for chest pain.  Gastrointestinal: Negative for nausea, vomiting and diarrhea.  Musculoskeletal: Negative for neck pain.  Neurological: Negative for headaches.  All other systems reviewed and are negative.  Past Medical History  Diagnosis Date  . Asthma    History reviewed. No pertinent past surgical history.  reports that he has quit smoking. His smoking use included Cigarettes and Cigars. He smoked 0.00 packs per day. He has never used smokeless tobacco. He reports that he drinks alcohol. He reports that he does not use illicit drugs. family history is not on file. Allergies  Allergen Reactions  . Ace Inhibitors Cough       Objective:   Physical Exam  Nursing note and vitals reviewed. Constitutional: He is oriented to person, place, and time. He appears well-developed and well-nourished. No distress.    HENT:  Head: Normocephalic and atraumatic.  Right Ear: External ear normal.  Left Ear: External ear normal.  Oropharynx is slightly erythematous, no exudate.  Bilateral tympanic membranes have a scar from old tympanostomy tubes, otherwise normal.  Right maxillary sinus exquisitely tender to palpation, left maxillary sinus and bilateral frontal sinuses nontender to palpation.  Eyes: Conjunctivae and EOM are normal. Pupils are equal, round, and reactive to light.  Neck: Normal range of motion. Neck supple. No JVD present.  Cardiovascular: Normal rate, regular rhythm, normal heart sounds and intact distal pulses.  Exam reveals no gallop and no friction rub.   No murmur heard. Pulmonary/Chest: Effort normal and breath sounds normal. No stridor. No respiratory distress. He has no wheezes. He has no rales. He exhibits no tenderness.  Musculoskeletal: Normal range of motion.  Lymphadenopathy:    He has no cervical adenopathy.  Neurological: He is alert and oriented to person, place, and time.  Skin: Skin is warm and dry. No rash noted. He is not diaphoretic. No erythema. No pallor.  Psychiatric: He has a normal mood and affect. His behavior is normal. Judgment and thought content normal.    Filed Vitals:   08/20/13 1539  BP: 130/90  Pulse: 108  Temp: 98.2 F (36.8 C)  Resp: 18   Lab Results  Component Value Date   WBC 9.1 03/09/2012   HGB 15.2 03/09/2012   HCT 45.8 03/09/2012   PLT 274.0 03/09/2012   GLUCOSE 117* 10/08/2012   CHOL 166 10/08/2012   TRIG 250.0* 10/08/2012   HDL 35.70* 10/08/2012   LDLDIRECT 97.2 10/08/2012  ALT 32 10/08/2012   AST 20 10/08/2012   NA 140 10/08/2012   K 4.6 10/08/2012   CL 109 10/08/2012   CREATININE 1.1 10/08/2012   BUN 17 10/08/2012   CO2 26 10/08/2012   TSH 2.64 03/09/2012   HGBA1C 6.0 10/08/2012       Assessment & Plan:  Michael Jacobs was seen today for sinusitis.  Diagnoses and associated orders for this visit:  Acute  sinusitis - amoxicillin-clavulanate (AUGMENTIN) 875-125 MG per tablet; Take 1 tablet by mouth 2 (two) times daily.   Also trial of plain mucinex and Nasal steroid spray to see if they help decrease symptoms. Followup in 1 week to reassess.  Patient Instructions  Augmentin twice daily for 10 days for sinusitis.  Plain Over the Counter Mucinex (NOT Mucinex D) for thick secretions  Force NON dairy fluids, drinking plenty of water is best.    Over the Counter Flonase OR Nasacort AQ 1 spray in each nostril twice a day as needed. Use the "crossover" technique into opposite nostril spraying toward opposite ear @ 45 degree angle, not straight up into nostril.   Saline Irrigation and Saline Sprays can also help reduce symptoms.  Follow up in 1 week to reassess, or sooner if symptoms persist or worsen despite treatment.

## 2013-10-08 ENCOUNTER — Other Ambulatory Visit: Payer: Self-pay | Admitting: Internal Medicine

## 2013-11-07 ENCOUNTER — Other Ambulatory Visit: Payer: Self-pay | Admitting: Internal Medicine

## 2013-11-07 DIAGNOSIS — E119 Type 2 diabetes mellitus without complications: Secondary | ICD-10-CM

## 2013-11-13 ENCOUNTER — Other Ambulatory Visit (INDEPENDENT_AMBULATORY_CARE_PROVIDER_SITE_OTHER): Payer: 59

## 2013-11-13 DIAGNOSIS — R799 Abnormal finding of blood chemistry, unspecified: Secondary | ICD-10-CM

## 2013-11-13 DIAGNOSIS — E119 Type 2 diabetes mellitus without complications: Secondary | ICD-10-CM

## 2013-11-13 LAB — BASIC METABOLIC PANEL
BUN: 15 mg/dL (ref 6–23)
CALCIUM: 9.5 mg/dL (ref 8.4–10.5)
CO2: 25 mEq/L (ref 19–32)
CREATININE: 1.1 mg/dL (ref 0.4–1.5)
Chloride: 104 mEq/L (ref 96–112)
GFR: 79.17 mL/min (ref >60.00)
Glucose, Bld: 104 mg/dL — ABNORMAL HIGH (ref 70–99)
Potassium: 4 mEq/L (ref 3.5–5.1)
Sodium: 139 mEq/L (ref 135–145)

## 2013-11-13 LAB — LIPID PANEL
Cholesterol: 220 mg/dL — ABNORMAL HIGH (ref 0–200)
HDL: 27.1 mg/dL — AB (ref >39.00)
NonHDL: 192.9
Total CHOL/HDL Ratio: 8
VLDL: 211.2 mg/dL — AB (ref 0.0–40.0)

## 2013-11-13 LAB — LDL CHOLESTEROL, DIRECT: Direct LDL: 56.6 mg/dL

## 2013-11-13 LAB — MICROALBUMIN / CREATININE URINE RATIO
Creatinine,U: 235.8 mg/dL
Microalb Creat Ratio: 3.1 mg/g (ref 0.0–30.0)
Microalb, Ur: 7.3 mg/dL — ABNORMAL HIGH (ref 0.0–1.9)

## 2013-11-13 LAB — HEMOGLOBIN A1C: HEMOGLOBIN A1C: 6.2 % (ref 4.6–6.5)

## 2013-11-15 ENCOUNTER — Other Ambulatory Visit: Payer: 59

## 2013-11-18 MED ORDER — FENOFIBRATE 50 MG PO TABS: 50.0000 mg | ORAL_TABLET | Freq: Every day | ORAL | Status: DC

## 2013-11-18 NOTE — Addendum Note (Signed)
Addended by: Agnes Lawrence on: 11/18/2013 11:17 AM   Modules accepted: Orders

## 2013-11-24 ENCOUNTER — Other Ambulatory Visit: Payer: Self-pay | Admitting: Internal Medicine

## 2013-11-25 ENCOUNTER — Ambulatory Visit: Payer: 59 | Admitting: Internal Medicine

## 2013-12-03 ENCOUNTER — Telehealth: Payer: Self-pay | Admitting: Internal Medicine

## 2013-12-03 NOTE — Telephone Encounter (Signed)
Pt would like for you to call him, states he has some questions regarding his lab results.

## 2013-12-06 NOTE — Telephone Encounter (Signed)
Pt wants to know if he can use artifical sweetners.  He also wanted to know if he should be concerned about the microalbumin.  He states he does not drink a lot of alcohol.  He is a social drinker.  He is concerned about triglycerides. He has an appt with you on 12/13/13 but he does not want to wait that long to discuss labs

## 2013-12-06 NOTE — Telephone Encounter (Signed)
Microalbumin Cr ratio only slightly elevated.  Continue ACE inhibitor.  I sign of overt kidney issue at this point.  I will further discuss at next OV.  He should not be overly concerned.

## 2013-12-11 NOTE — Telephone Encounter (Signed)
Left message on machine for patient to return our call 

## 2013-12-12 ENCOUNTER — Telehealth: Payer: Self-pay | Admitting: Internal Medicine

## 2013-12-12 DIAGNOSIS — I10 Essential (primary) hypertension: Secondary | ICD-10-CM

## 2013-12-12 DIAGNOSIS — E785 Hyperlipidemia, unspecified: Secondary | ICD-10-CM

## 2013-12-12 NOTE — Telephone Encounter (Signed)
Pt wanted to know if he need blood work before he see Dr Shawna Orleans on 12/26/13

## 2013-12-13 ENCOUNTER — Ambulatory Visit: Payer: 59 | Admitting: Internal Medicine

## 2013-12-13 NOTE — Telephone Encounter (Signed)
I suggest FLP and LFTs before next OV.

## 2013-12-13 NOTE — Telephone Encounter (Signed)
This needs to be discussed with Dr. Shawna Orleans when available.

## 2013-12-13 NOTE — Telephone Encounter (Signed)
Left message on machine for patient to return our call 

## 2013-12-13 NOTE — Addendum Note (Signed)
Addended by: Westley Hummer B on: 12/13/2013 05:05 PM   Modules accepted: Orders

## 2013-12-13 NOTE — Telephone Encounter (Signed)
LVM for pt that labs would not need to be done prior to appt unless it is requested.

## 2013-12-13 NOTE — Telephone Encounter (Signed)
Spoke with patient, appointment made, labs ordered.

## 2013-12-18 ENCOUNTER — Other Ambulatory Visit (INDEPENDENT_AMBULATORY_CARE_PROVIDER_SITE_OTHER): Payer: 59

## 2013-12-18 DIAGNOSIS — R7989 Other specified abnormal findings of blood chemistry: Secondary | ICD-10-CM

## 2013-12-18 DIAGNOSIS — I10 Essential (primary) hypertension: Secondary | ICD-10-CM

## 2013-12-18 DIAGNOSIS — E785 Hyperlipidemia, unspecified: Secondary | ICD-10-CM

## 2013-12-18 LAB — LIPID PANEL
CHOL/HDL RATIO: 6
Cholesterol: 187 mg/dL (ref 0–200)
HDL: 30.3 mg/dL — ABNORMAL LOW (ref >39.00)
NonHDL: 156.7
Triglycerides: 519 mg/dL — ABNORMAL HIGH (ref 0.0–149.0)
VLDL: 103.8 mg/dL — ABNORMAL HIGH (ref 0.0–40.0)

## 2013-12-18 LAB — HEPATIC FUNCTION PANEL
ALT: 44 U/L (ref 0–53)
AST: 28 U/L (ref 0–37)
Albumin: 4.2 g/dL (ref 3.5–5.2)
Alkaline Phosphatase: 69 U/L (ref 39–117)
BILIRUBIN TOTAL: 0.7 mg/dL (ref 0.2–1.2)
Bilirubin, Direct: 0 mg/dL (ref 0.0–0.3)
Total Protein: 7.5 g/dL (ref 6.0–8.3)

## 2013-12-18 LAB — LDL CHOLESTEROL, DIRECT: Direct LDL: 82.6 mg/dL

## 2013-12-20 NOTE — Telephone Encounter (Signed)
Pt aware.

## 2013-12-22 ENCOUNTER — Other Ambulatory Visit: Payer: Self-pay | Admitting: Internal Medicine

## 2013-12-23 ENCOUNTER — Telehealth: Payer: Self-pay | Admitting: Internal Medicine

## 2013-12-23 NOTE — Telephone Encounter (Signed)
[place], [place] is requesting re-fill ALPRAZolam (XANAX) 0.25 MG tablet

## 2013-12-24 ENCOUNTER — Telehealth: Payer: Self-pay | Admitting: Internal Medicine

## 2013-12-24 ENCOUNTER — Ambulatory Visit: Payer: 59 | Admitting: Internal Medicine

## 2013-12-24 MED ORDER — PANTOPRAZOLE SODIUM 40 MG PO TBEC: DELAYED_RELEASE_TABLET | ORAL | Status: DC

## 2013-12-24 MED ORDER — ALPRAZOLAM 0.25 MG PO TABS: 0.2500 mg | ORAL_TABLET | Freq: Every day | ORAL | Status: DC | PRN

## 2013-12-24 NOTE — Telephone Encounter (Signed)
Only RF x 1.  We need OV to discuss whether he continues to use ongoing basis or we discuss other treatment options.

## 2013-12-24 NOTE — Telephone Encounter (Signed)
[place], [place] is requesting re-fill on pantoprazole (PROTONIX) 40 MG tablet

## 2013-12-24 NOTE — Telephone Encounter (Signed)
Pt has an appt on 12/31/13.  30 day supply sent in electronically to [facility]

## 2013-12-24 NOTE — Telephone Encounter (Signed)
rx called in

## 2013-12-31 ENCOUNTER — Ambulatory Visit: Payer: 59 | Admitting: Internal Medicine

## 2014-01-02 ENCOUNTER — Ambulatory Visit (INDEPENDENT_AMBULATORY_CARE_PROVIDER_SITE_OTHER): Payer: 59 | Admitting: Physician Assistant

## 2014-01-02 ENCOUNTER — Encounter: Payer: Self-pay | Admitting: Physician Assistant

## 2014-01-02 VITALS — BP 130/90 | HR 84 | Temp 97.9°F | Resp 18 | Wt 276.5 lb

## 2014-01-02 DIAGNOSIS — I10 Essential (primary) hypertension: Secondary | ICD-10-CM

## 2014-01-02 DIAGNOSIS — Z23 Encounter for immunization: Secondary | ICD-10-CM

## 2014-01-02 DIAGNOSIS — Z09 Encounter for follow-up examination after completed treatment for conditions other than malignant neoplasm: Secondary | ICD-10-CM

## 2014-01-02 DIAGNOSIS — E119 Type 2 diabetes mellitus without complications: Secondary | ICD-10-CM

## 2014-01-02 DIAGNOSIS — E785 Hyperlipidemia, unspecified: Secondary | ICD-10-CM

## 2014-01-02 NOTE — Progress Notes (Signed)
Pre visit review using our clinic review tool, if applicable. No additional management support is needed unless otherwise documented below in the visit note. 

## 2014-01-02 NOTE — Addendum Note (Signed)
Addended by: Colleen Can on: 01/02/2014 02:27 PM   Modules accepted: Orders

## 2014-01-02 NOTE — Progress Notes (Signed)
Subjective:    Patient ID: Michael Jacobs, male    DOB: [birth year], 43 y.o.   MRN: [id number]  HPI Patient is a 43 y.o. male presenting for follow up on DMII and HTN. DMII- Pt is currently being treated with metformin 500mg  BID. Pt states that he tolerates the medication well and denies any adverse effects to treatment. The pts A1c on 11/13/13 was 6.2%  Compared with 6.0% last year. The pt states that he recently started a new diet and exercise routine to try and help his weight and DMII.  HTN- Controlled with amlodipine and losartan. Pt states that he tolerates this well and denies adverse effects to treatment. He states that home BPs 130/90 on average. Pt also has Diovan in his chart but is unsure if he is taking this. Patient denies fevers, chills, nausea, vomiting, diarrhea, shortness of breath, chest pain, headaches, syncope.  HLD- Controlled with fenofibrate and pravastatin. Pt states he is tolerating the medication well and denies adverse effects to treatment.  Pt has had an Endoscopy with GI in the past to evaluate globus sensation when eating. He was told that he may develop hiatal hernia in the future, and was told to follow up every few years, He last saw GI 9 years ago, and requesting a referral to see them again as he has been noticing the fullness sensation again.   Pt had a chest xray in the past that had shown pneumonia. Pt was sick with pneumonia a few months ago, and is wishing to have a chest xray done today to make sure everything has cleared. He states that this was recommended to him following his visit for pneumonia a few months ago.  Review of Systems As per HPI and are otherwise negative.    Past Medical History  Diagnosis Date  . Asthma     History   Social History  . Marital Status: Married    Spouse Name: N/A    Number of Children: N/A  . Years of Education: N/A   Occupational History  . Not on file.   Social History Main Topics  . Smoking status:  Former Smoker    Types: Cigarettes, Cigars  . Smokeless tobacco: Never Used  . Alcohol Use: Yes     Comment: occ  . Drug Use: No  . Sexual Activity: Not on file   Other Topics Concern  . Not on file   Social History Narrative  . No narrative on file    History reviewed. No pertinent past surgical history.  No family history on file.  Allergies  Allergen Reactions  . Ace Inhibitors Cough    Current Outpatient Prescriptions on File Prior to Visit  Medication Sig Dispense Refill  . albuterol (PROVENTIL HFA;VENTOLIN HFA) 108 (90 BASE) MCG/ACT inhaler Inhale 2 puffs into the lungs every 6 (six) hours as needed.  8.5 g  5  . ALPRAZolam (XANAX) 0.25 MG tablet Take 1 tablet (0.25 mg total) by mouth daily as needed for sleep or anxiety.  30 tablet  0  . amLODipine (NORVASC) 5 MG tablet TAKE 1 TABLET (5 MG TOTAL) BY MOUTH DAILY.  90 tablet  0  . amoxicillin-clavulanate (AUGMENTIN) 875-125 MG per tablet Take 1 tablet by mouth 2 (two) times daily.  20 tablet  0  . DIOVAN 160 MG tablet Take 1 tablet (160 mg total) by mouth daily.  90 tablet  1  . fenofibrate (TRIGLIDE) 50 MG tablet Take 1 tablet (50 mg total)  by mouth daily.  30 tablet  2  . losartan (COZAAR) 100 MG tablet TAKE 1 TABLET (100 MG TOTAL) BY MOUTH DAILY.  90 tablet  0  . metFORMIN (GLUCOPHAGE) 500 MG tablet Take 1 tablet (500 mg total) by mouth 3 (three) times daily with meals.  90 tablet  5  . pantoprazole (PROTONIX) 40 MG tablet TAKE 1 TABLET (40 MG TOTAL) BY MOUTH DAILY.  30 tablet  0  . pravastatin (PRAVACHOL) 40 MG tablet TAKE 1 TABLET (40 MG TOTAL) BY MOUTH DAILY.  90 tablet  0   No current facility-administered medications on file prior to visit.    The PFS history was reviewed with the pt at time of visit.  EXAM: BP 130/90  Pulse 84  Temp(Src) 97.9 F (36.6 C) (Oral)  Resp 18  Wt 276 lb 8 oz (125.42 kg)  SpO2 99%     Objective:   Physical Exam  Nursing note and vitals reviewed. Constitutional: He is  oriented to person, place, and time. He appears well-developed and well-nourished. No distress.  HENT:  Head: Normocephalic and atraumatic.  Eyes: Conjunctivae and EOM are normal.  Neck: Normal range of motion. Neck supple.  Cardiovascular: Normal rate, regular rhythm and intact distal pulses.   Pulmonary/Chest: Effort normal and breath sounds normal. No respiratory distress. He has no wheezes. He has no rales. He exhibits no tenderness.  Musculoskeletal: Normal range of motion. He exhibits no edema.  Neurological: He is alert and oriented to person, place, and time.  Skin: Skin is warm and dry. No rash noted. He is not diaphoretic. No erythema. No pallor.  Psychiatric: He has a normal mood and affect. His behavior is normal. Judgment and thought content normal.     Lab Results  Component Value Date   WBC 9.1 03/09/2012   HGB 15.2 03/09/2012   HCT 45.8 03/09/2012   PLT 274.0 03/09/2012   GLUCOSE 104* 11/13/2013   CHOL 187 12/18/2013   TRIG 519.0 Triglyceride is over 400; calculations on Lipids are invalid.* 12/18/2013   HDL 30.30* 12/18/2013   LDLDIRECT 82.6 12/18/2013   ALT 44 12/18/2013   AST 28 12/18/2013   NA 139 11/13/2013   K 4.0 11/13/2013   CL 104 11/13/2013   CREATININE 1.1 11/13/2013   BUN 15 11/13/2013   CO2 25 11/13/2013   TSH 2.64 03/09/2012   HGBA1C 6.2 11/13/2013   MICROALBUR 7.3* 11/13/2013        Assessment & Plan:  Teddy was seen today for follow up dm and blood pressure check.  Diagnoses and associated orders for this visit:  Follow up Comments: Needs f/u CXR Dr. Recomended for PNA a few months ago. Needs GI referral. - Ambulatory referral to Gastroenterology - DG Chest 2 View; Future  Unspecified essential hypertension Comments: Continue Current medication regimen, diet and exercise. Make sure he is only taking losartan and amlodipine.  Type 2 diabetes mellitus without complication Comments: Continue current medication regimen, diet and exercise.    Hyperlipidemia Comments: Continue with current regimen. Make sure he is taking statin.   Spoke with Dr. Burnice Logan regarding the patient, and I agree with him that we need to make sure that the patient is taking a statin as he is a diabetic, and make sure that he is not taking Diovan in addition to the low [place]. The patient will call from home to relay exactly which medications he is taking.  Return precautions provided, and patient handout on DMII and HTN.  Plan to follow up in 2 to 3 months to reassess, or for worsening or persistent symptoms despite treatment.  Patient Instructions  We need to have you on a Statin medication as it is recommended in diabetics.   Diovan and Losartan are both ARB medications, and you should not be on both of these ans the 2 together is not likely providing any additional benefit.  Please call and give [place] the names of the medications that you are currently taking so that we can have an updated drug list.  If emergency symptoms discussed during visit developed, seek medical attention immediately.  Followup 2 to 3 months to reassess, or for worsening or persistent symptoms despite treatment.

## 2014-01-02 NOTE — Patient Instructions (Addendum)
We need to have you on a Statin medication as it is recommended in diabetics.   Diovan and Losartan are both ARB medications, and you should not be on both of these ans the 2 together is not likely providing any additional benefit.  Please call and give [place] the names of the medications that you are currently taking so that we can have an updated drug list.  If emergency symptoms discussed during visit developed, seek medical attention immediately.  Followup 2 to 3 months to reassess, or for worsening or persistent symptoms despite treatment.     Hypertension Hypertension is another name for high blood pressure. High blood pressure forces your heart to work harder to pump blood. A blood pressure reading has two numbers, which includes a higher number over a lower number (example: 110/72). HOME CARE   Have your blood pressure rechecked by your doctor.  Only take medicine as told by your doctor. Follow the directions carefully. The medicine does not work as well if you skip doses. Skipping doses also puts you at risk for problems.  Do not smoke.  Monitor your blood pressure at home as told by your doctor. GET HELP IF:  You think you are having a reaction to the medicine you are taking.  You have repeat headaches or feel dizzy.  You have puffiness (swelling) in your ankles.  You have trouble with your vision. GET HELP RIGHT AWAY IF:   You get a very bad headache and are confused.  You feel weak, numb, or faint.  You get chest or belly (abdominal) pain.  You throw up (vomit).  You cannot breathe very well. MAKE SURE YOU:   Understand these instructions.  Will watch your condition.  Will get help right away if you are not doing well or get worse. Document Released: 09/14/2007 Document Revised: 04/02/2013 Document Reviewed: 01/18/2013 [facility] Patient Information 2015 [place], [place]. This information is not intended to replace advice given to you by your health care  provider. Make sure you discuss any questions you have with your health care provider. Type 2 Diabetes Mellitus Type 2 diabetes mellitus is a long-term (chronic) disease. In type 2 diabetes:  The pancreas does not make enough of a hormone called insulin.  The cells in the body do not respond as well to the insulin that is made.  Both of the above can happen. Normally, insulin moves sugars from food into tissue cells. This gives you energy. If you have type 2 diabetes, sugars cannot be moved into tissue cells. This causes high blood sugar (hyperglycemia).  HOME CARE  Have your hemoglobin A1c level checked twice a year. The level shows if your diabetes is under control or out of control.  Test your blood sugar level every day as told by your doctor.  Check your ketone levels by testing your pee (urine) when you are sick and as told.  Take your diabetes or insulin medicine as told by your doctor.  Never run out of insulin.  Adjust how much insulin you give yourself based on how many carbs (carbohydrates) you eat. Carbs are in many foods, such as fruits, vegetables, whole grains, and dairy products.  Have a healthy snack between every healthy meal. Have 3 meals and 3 snacks a day.  Lose weight if you are overweight.  Carry a medical alert card or wear your medical alert jewelry.  Carry a 15-gram carb snack with you at all times. Examples include:  Glucose pills, 3 or 4.  Glucose gel, 15-gram tube.  Raisins, 2 tablespoons (24 grams).  Jelly beans, 6.  Animal crackers, 8.  Regular (not diet) pop, 4 ounces (120 milliliters).  Gummy treats, 9.  Notice low blood sugar (hypoglycemia) symptoms, such as:  Shaking (tremors).  Trouble thinking clearly.  Sweating.  Faster heart rate.  Headache.  Dry mouth.  Hunger.  Crabbiness (irritability).  Being worried or tense (anxious).  Restless sleep.  A change in speech or coordination.  Confusion.  Treat low blood  sugar right away. If you are alert and can swallow, follow the 15:15 rule:  Take 15-20 grams of a rapid-acting glucose or carb. This includes glucose gel, glucose pills, or 4 ounces (120 milliliters) of fruit juice, regular pop, or low-fat milk.  Check your blood sugar level 15 minutes after taking the glucose.  Take 15-20 grams more of glucose if the repeat blood sugar level is still 70 mg/dL (milligrams/deciliter) or below.  Eat a meal or snack within 1 hour of the blood sugar levels going back to normal.  Notice early symptoms of high blood sugar, such as:  Being really thirsty or drinking a lot (polydipsia).  Peeing a lot (polyuria).  Do at least 150 minutes of physical activity a week or as told.  Split the 150 minutes of activity up during the week. Do not do 150 minutes of activity in one day.  Perform exercises, such as weight lifting, at least 2 times a week or as told.  Spend no more than 90 minutes at one time inactive.  Adjust your insulin or food intake as needed if you start a new exercise or sport.  Follow your sick-day plan when you are not able to eat or drink as usual.  Do not smoke, chew tobacco, or use electronic cigarettes.  Women who are not pregnant should drink no more than 1 drink a day. Men should drink no more than 2 drinks a day.  Only drink alcohol with food.  Ask your doctor if alcohol is safe for you.  Tell your doctor if you drink alcohol several times during the week.  See your doctor regularly.  Schedule an eye exam soon after you are told you have diabetes. Schedule exams once every year.  Check your skin and feet every day. Check for cuts, bruises, redness, nail problems, bleeding, blisters, or sores. A doctor should do a foot exam once a year.  Brush your teeth and gums twice a day. Floss once a day. Visit your dentist regularly.  Share your diabetes plan with your workplace or school.  Stay up-to-date with shots that fight against  diseases (immunizations).  Learn how to deal with stress.  Get diabetes education and support as needed.  Ask your doctor for special help if:  You need help to maintain or improve how you do things on your own.  You need help to maintain or improve the quality of your life.  You have foot or hand problems.  You have trouble cleaning yourself, dressing, eating, or doing physical activity. GET HELP IF:  You are unable to eat or drink for more than 6 hours.  You feel sick to your stomach (nauseous) or throw up (vomit) for more than 6 hours.  Your blood sugar level is over 240 mg/dL.  There is a change in mental status.  You get another serious illness.  You have watery poop (diarrhea) for more than 6 hours.  You have been sick or have had a fever for  2 or more days and are not getting better.  You have pain when you are active. GET HELP RIGHT AWAY IF:  You have trouble breathing.  Your ketone levels are higher than your doctor says they should be. MAKE SURE YOU:  Understand these instructions.  Will watch your condition.  Will get help right away if you are not doing well or get worse. Document Released: 01/05/2008 Document Revised: 08/12/2013 Document Reviewed: 10/28/2011 [facility] Patient Information 2015 [place], [place]. This information is not intended to replace advice given to you by your health care provider. Make sure you discuss any questions you have with your health care provider.

## 2014-01-03 ENCOUNTER — Telehealth: Payer: Self-pay | Admitting: Internal Medicine

## 2014-01-03 NOTE — Telephone Encounter (Signed)
Pt states thiese are the meds he is on and needs these refilled All 90 day except the metformin  pantoprazole (PROTONIX) 40 MG tablet  1 x day   fenofibrate (TRIGLIDE) 50 MG tablet   1 x day     losartan (COZAAR) 100 MG tablet 1 x day amLODipine (NORVASC) 5 MG tablet  1 x day  pravastatin (PRAVACHOL) 40 MG tablet metFORMIN (GLUCOPHAGE) 500 MG tablet  (2 x day)  )30 day only Pt gets this for free)  ALPRAZolam (XANAX) 0.25 MG tablet  (90 day) [name]  [address]                                        My Last Outpatient Progress NoteYou have written no Outpatient Progress Notes for this patient                                       My Last Outpatient Progress NoteYou have written no Outpatient Progress Notes for this patient

## 2014-01-06 MED ORDER — PRAVASTATIN SODIUM 40 MG PO TABS: ORAL_TABLET | ORAL | Status: DC

## 2014-01-06 MED ORDER — METFORMIN HCL 500 MG PO TABS: 500.0000 mg | ORAL_TABLET | Freq: Three times a day (TID) | ORAL | Status: DC

## 2014-01-06 MED ORDER — LOSARTAN POTASSIUM 100 MG PO TABS: ORAL_TABLET | ORAL | Status: DC

## 2014-01-06 MED ORDER — FENOFIBRATE 50 MG PO TABS: 50.0000 mg | ORAL_TABLET | Freq: Every day | ORAL | Status: DC

## 2014-01-06 MED ORDER — ALPRAZOLAM 0.25 MG PO TABS: 0.2500 mg | ORAL_TABLET | Freq: Every day | ORAL | Status: DC | PRN

## 2014-01-06 MED ORDER — PANTOPRAZOLE SODIUM 40 MG PO TBEC: DELAYED_RELEASE_TABLET | ORAL | Status: DC

## 2014-01-06 NOTE — Telephone Encounter (Signed)
Ok to refill 

## 2014-01-06 NOTE — Telephone Encounter (Signed)
All other prescriptions sent; please advise on alprazolam.

## 2014-01-06 NOTE — Telephone Encounter (Signed)
Rx called in to office.

## 2014-01-08 ENCOUNTER — Ambulatory Visit (INDEPENDENT_AMBULATORY_CARE_PROVIDER_SITE_OTHER)
Admission: RE | Admit: 2014-01-08 | Discharge: 2014-01-08 | Disposition: A | Discharge status: Home / Self Care | Payer: 59 | Source: Ambulatory Visit | Attending: Physician Assistant | Admitting: Physician Assistant

## 2014-01-08 DIAGNOSIS — Z09 Encounter for follow-up examination after completed treatment for conditions other than malignant neoplasm: Secondary | ICD-10-CM

## 2014-01-09 ENCOUNTER — Telehealth: Payer: Self-pay

## 2014-01-09 DIAGNOSIS — R0989 Other specified symptoms and signs involving the circulatory and respiratory systems: Secondary | ICD-10-CM

## 2014-01-09 NOTE — Telephone Encounter (Signed)
Referral done and Michael Jacobs is aware.

## 2014-01-09 NOTE — Telephone Encounter (Signed)
Pt called to get an update on his referral.  Pls advise on dx for referral for endoscopy.

## 2014-01-15 ENCOUNTER — Encounter: Payer: Self-pay | Admitting: Gastroenterology

## 2014-01-23 ENCOUNTER — Other Ambulatory Visit: Payer: Self-pay | Admitting: Internal Medicine

## 2014-01-24 ENCOUNTER — Ambulatory Visit: Payer: 59 | Admitting: Gastroenterology

## 2014-01-27 ENCOUNTER — Ambulatory Visit: Payer: 59 | Admitting: Internal Medicine

## 2014-01-30 ENCOUNTER — Encounter: Payer: Self-pay | Admitting: *Deleted

## 2014-02-03 ENCOUNTER — Encounter: Payer: Self-pay | Admitting: Gastroenterology

## 2014-02-03 NOTE — Telephone Encounter (Signed)
ERROR

## 2014-02-06 ENCOUNTER — Ambulatory Visit (INDEPENDENT_AMBULATORY_CARE_PROVIDER_SITE_OTHER): Payer: 59 | Admitting: Gastroenterology

## 2014-02-06 ENCOUNTER — Encounter: Payer: Self-pay | Admitting: Gastroenterology

## 2014-02-06 VITALS — BP 136/100 | HR 88 | Ht 73.0 in | Wt 281.6 lb

## 2014-02-06 DIAGNOSIS — K219 Gastro-esophageal reflux disease without esophagitis: Secondary | ICD-10-CM

## 2014-02-06 DIAGNOSIS — Z8371 Family history of colonic polyps: Secondary | ICD-10-CM

## 2014-02-06 DIAGNOSIS — R1314 Dysphagia, pharyngoesophageal phase: Secondary | ICD-10-CM

## 2014-02-06 DIAGNOSIS — Z83719 Family history of colon polyps, unspecified: Secondary | ICD-10-CM

## 2014-02-06 MED ORDER — PANTOPRAZOLE SODIUM 40 MG PO TBEC: 40.0000 mg | DELAYED_RELEASE_TABLET | Freq: Two times a day (BID) | ORAL | Status: DC

## 2014-02-06 MED ORDER — SUCRALFATE 1 GM/10ML PO SUSP: 1.0000 g | Freq: Four times a day (QID) | ORAL | Status: DC

## 2014-02-06 MED ORDER — MOVIPREP 100 G PO SOLR: 1.0000 | Freq: Once | ORAL | Status: DC

## 2014-02-06 NOTE — Patient Instructions (Signed)
You have been scheduled for an endoscopy and colonoscopy. Please follow the written instructions given to you at your visit today. Please pick up your prep at the pharmacy within the next 1-3 days. If you use inhalers (even only as needed), please bring them with you on the day of your procedure. Your physician has requested that you go to www.startemmi.com and enter the access code given to you at your visit today. This web site gives a general overview about your procedure. However, you should still follow specific instructions given to you by our office regarding your preparation for the procedure.  Increase Pantoprazole to one tablet twice daily, new prescription was sent to your pharmacy   We have sent the following medications to your pharmacy for you to pick up at your convenience: Carafate Pantoprazole

## 2014-02-06 NOTE — Progress Notes (Signed)
Reviewed and agree with management plan.  Mays Paino T. Bj Morlock, MD FACG 

## 2014-02-06 NOTE — Progress Notes (Signed)
02/06/2014 Michael Jacobs [id number] [birth year]   HISTORY OF PRESENT ILLNESS:  This is a pleasant 43 year old male who is new to our practice and was referred by his PCP, Dr. Shawna Orleans.  He presents to our office today with complaints of discomfort/globus sensation in his lower esophagus after eating.  He says that maybe it feels like food gets hung up on occasion, but does not get stuck for the most part.  He describes the sensation as it feeling like there is a knot in his lower esophagus or like a pressure.  Had an EGD over 10 years ago at [facility] by Dr. Vira Agar.  He has been on PPIs for many years, first was on Nexium but now has been taking pantoprazole for quite some time.  Pantoprazole definitely helps with his reflux/heartburn and he can really tell if he forgets to take it.  Denies nausea, vomiting, abdominal pain, or weight loss.  He has a family history of colon polyps in his mother.  He is concerned and would like to have a colonoscopy for screening as well if possible.  Denies any lower GI complaints.  Past Medical History  Diagnosis Date  . Asthma   . GERD (gastroesophageal reflux disease)   . Hypertension   . Hypercholesterolemia   . Diabetes mellitus   . Anxiety    Past Surgical History  Procedure Laterality Date  . Tonsillectomy and adenoidectomy    . Wisdom tooth extraction      reports that he has quit smoking. His smoking use included Cigarettes and Cigars. He smoked 0.00 packs per day. He has never used smokeless tobacco. He reports that he drinks alcohol. He reports that he does not use illicit drugs. family history includes Breast cancer in his paternal grandmother; Colon cancer in his maternal grandmother; Colon polyps in his mother; Diabetes in his maternal grandmother; Heart disease in his maternal grandmother. Allergies  Allergen Reactions  . Ace Inhibitors Cough  . Phenergan [Promethazine Hcl] Anxiety      Outpatient Encounter Prescriptions as of  02/06/2014  Medication Sig  . albuterol (PROVENTIL HFA;VENTOLIN HFA) 108 (90 BASE) MCG/ACT inhaler Inhale 2 puffs into the lungs every 6 (six) hours as needed.  . ALPRAZolam (XANAX) 0.25 MG tablet Take 1 tablet (0.25 mg total) by mouth daily as needed for sleep or anxiety.  Marland Kitchen amLODipine (NORVASC) 5 MG tablet TAKE 1 TABLET (5 MG TOTAL) BY MOUTH DAILY.  Marland Kitchen DIOVAN 160 MG tablet Take 1 tablet (160 mg total) by mouth daily.  . fenofibrate (TRIGLIDE) 50 MG tablet Take 1 tablet (50 mg total) by mouth daily.  Marland Kitchen losartan (COZAAR) 100 MG tablet TAKE 1 TABLET (100 MG TOTAL) BY MOUTH DAILY.  . metFORMIN (GLUCOPHAGE) 500 MG tablet Take 1 tablet (500 mg total) by mouth 3 (three) times daily with meals.  . pravastatin (PRAVACHOL) 40 MG tablet TAKE 1 TABLET (40 MG TOTAL) BY MOUTH DAILY.  . [DISCONTINUED] pantoprazole (PROTONIX) 40 MG tablet TAKE 1 TABLET (40 MG TOTAL) BY MOUTH DAILY.  Marland Kitchen MOVIPREP 100 G SOLR Take 1 kit (200 g total) by mouth once.  . pantoprazole (PROTONIX) 40 MG tablet Take 1 tablet (40 mg total) by mouth 2 (two) times daily.  . sucralfate (CARAFATE) 1 GM/10ML suspension Take 10 mLs (1 g total) by mouth 4 (four) times daily.  . [DISCONTINUED] amoxicillin-clavulanate (AUGMENTIN) 875-125 MG per tablet Take 1 tablet by mouth 2 (two) times daily.     REVIEW OF  SYSTEMS  : All other systems reviewed and negative except where noted in the History of Present Illness.   PHYSICAL EXAM: BP 136/100  Pulse 88  Ht $R'6\' 1"'MN$  (1.854 m)  Wt 281 lb 9.6 oz (127.733 kg)  BMI 37.16 kg/m2 General: Well developed white male in no acute distress Head: Normocephalic and atraumatic Eyes:  Sclerae anicteric, conjunctiva pink. Ears: Normal auditory acuity Lungs: Clear throughout to auscultation Heart: Regular rate and rhythm Abdomen: Soft, non-distended.  Normal bowel sounds.  Non-tender. Musculoskeletal: Symmetrical with no gross deformities  Skin: No lesions on visible extremities Extremities: No edema    Neurological: Alert oriented x 4, grossly non-focal Psychological:  Alert and cooperative. Normal mood and affect  ASSESSMENT AND PLAN: -Long-standing GERD/dysphagia/globus sensation:  Will schedule EGD to rule out stricture vs reflux esophagitis, etc.  Will increase pantoprazole to 40 mg BID in the interim and will add carafate suspension short term as well. -Family history of colon polyps in mother:  Will schedule for screening colonoscopy.  The risks, benefits, and alternatives for the procedures were discussed with the patient and he consents to proceed.

## 2014-02-18 ENCOUNTER — Encounter: Payer: Self-pay | Admitting: Gastroenterology

## 2014-02-26 ENCOUNTER — Ambulatory Visit: Payer: 59 | Admitting: Internal Medicine

## 2014-03-04 ENCOUNTER — Ambulatory Visit: Payer: 59 | Admitting: Internal Medicine

## 2014-03-11 ENCOUNTER — Telehealth: Payer: Self-pay

## 2014-03-11 NOTE — Telephone Encounter (Signed)
Received a request from [name] for Fenofibrate 54 mg tablet.  Pharmacy is requesting refills for this medication as this is 1/2 the copay of the 50 mg.  Pls advise.

## 2014-03-12 MED ORDER — FENOFIBRATE 54 MG PO TABS: 54.0000 mg | ORAL_TABLET | Freq: Every day | ORAL | Status: DC

## 2014-03-12 NOTE — Telephone Encounter (Signed)
rx sent in electronically 

## 2014-03-12 NOTE — Telephone Encounter (Signed)
[place] for change.  Pt should compare drug prices at [employer].

## 2014-03-26 ENCOUNTER — Encounter: Payer: 59 | Admitting: Internal Medicine

## 2014-03-28 ENCOUNTER — Encounter: Payer: Self-pay | Admitting: Gastroenterology

## 2014-03-28 ENCOUNTER — Ambulatory Visit (AMBULATORY_SURGERY_CENTER): Payer: 59 | Admitting: Gastroenterology

## 2014-03-28 VITALS — BP 134/90 | HR 71 | Temp 98.0°F | Resp 13 | Ht 73.0 in | Wt 281.0 lb

## 2014-03-28 DIAGNOSIS — K633 Ulcer of intestine: Secondary | ICD-10-CM

## 2014-03-28 DIAGNOSIS — K317 Polyp of stomach and duodenum: Secondary | ICD-10-CM

## 2014-03-28 DIAGNOSIS — K219 Gastro-esophageal reflux disease without esophagitis: Secondary | ICD-10-CM

## 2014-03-28 DIAGNOSIS — D128 Benign neoplasm of rectum: Secondary | ICD-10-CM

## 2014-03-28 DIAGNOSIS — K209 Esophagitis, unspecified: Secondary | ICD-10-CM

## 2014-03-28 DIAGNOSIS — K621 Rectal polyp: Secondary | ICD-10-CM

## 2014-03-28 DIAGNOSIS — D120 Benign neoplasm of cecum: Secondary | ICD-10-CM

## 2014-03-28 DIAGNOSIS — Z1211 Encounter for screening for malignant neoplasm of colon: Secondary | ICD-10-CM

## 2014-03-28 DIAGNOSIS — Z8371 Family history of colonic polyps: Secondary | ICD-10-CM

## 2014-03-28 DIAGNOSIS — D129 Benign neoplasm of anus and anal canal: Secondary | ICD-10-CM

## 2014-03-28 DIAGNOSIS — K229 Disease of esophagus, unspecified: Secondary | ICD-10-CM

## 2014-03-28 LAB — GLUCOSE, CAPILLARY
GLUCOSE-CAPILLARY: 93 mg/dL (ref 70–99)
Glucose-Capillary: 116 mg/dL — ABNORMAL HIGH (ref 70–99)

## 2014-03-28 MED ORDER — SODIUM CHLORIDE 0.9 % IV SOLN: 500.0000 mL | INTRAVENOUS | Status: DC

## 2014-03-28 NOTE — Patient Instructions (Signed)
YOU HAD AN ENDOSCOPIC PROCEDURE TODAY AT [facility]: Refer to the procedure report that was given to you for any specific questions about what was found during the examination.  If the procedure report does not answer your questions, please call your gastroenterologist to clarify.  If you requested that your care partner not be given the details of your procedure findings, then the procedure report has been included in a sealed envelope for you to review at your convenience later.  YOU SHOULD EXPECT: Some feelings of bloating in the abdomen. Passage of more gas than usual.  Walking can help get rid of the air that was put into your GI tract during the procedure and reduce the bloating. If you had a lower endoscopy (such as a colonoscopy or flexible sigmoidoscopy) you may notice spotting of blood in your stool or on the toilet paper. If you underwent a bowel prep for your procedure, then you may not have a normal bowel movement for a few days.  DIET: Your first meal following the procedure should be a light meal and then it is ok to progress to your normal diet.  A half-sandwich or bowl of soup is an example of a good first meal.  Heavy or fried foods are harder to digest and may make you feel nauseous or bloated.  Likewise meals heavy in dairy and vegetables can cause extra gas to form and this can also increase the bloating.  Drink plenty of fluids but you should avoid alcoholic beverages for 24 hours. Try to increase the fiber in your diet.  ACTIVITY: Your care partner should take you home directly after the procedure.  You should plan to take it easy, moving slowly for the rest of the day.  You can resume normal activity the day after the procedure however you should NOT DRIVE or use heavy machinery for 24 hours (because of the sedation medicines used during the test).    SYMPTOMS TO REPORT IMMEDIATELY: A gastroenterologist can be reached at any hour.  During normal business hours, 8:30  AM to 5:00 PM Monday through Friday, call [phone].  After hours and on weekends, please call the GI answering service at [phone] who will take a message and have the physician on call contact you.   Following lower endoscopy (colonoscopy or flexible sigmoidoscopy):  Excessive amounts of blood in the stool  Significant tenderness or worsening of abdominal pains  Swelling of the abdomen that is new, acute  Fever of 100F or higher  Following upper endoscopy (EGD)  Vomiting of blood or coffee ground material  New chest pain or pain under the shoulder blades  Painful or persistently difficult swallowing  New shortness of breath  Fever of 100F or higher  Black, tarry-looking stools  FOLLOW UP: If any biopsies were taken you will be contacted by phone or by letter within the next 1-3 weeks.  Call your gastroenterologist if you have not heard about the biopsies in 3 weeks.  Our staff will call the home number listed on your records the next business day following your procedure to check on you and address any questions or concerns that you may have at that time regarding the information given to you following your procedure. This is a courtesy call and so if there is no answer at the home number and we have not heard from you through the emergency physician on call, we will assume that you have returned to your regular daily activities  without incident.  SIGNATURES/CONFIDENTIALITY: You and/or your care partner have signed paperwork which will be entered into your electronic medical record.  These signatures attest to the fact that that the information above on your After Visit Summary has been reviewed and is understood.  Full responsibility of the confidentiality of this discharge information lies with you and/or your care-partner.  NO ASPIRIN NOR NSAIDS FOR TWO WEEKS PER DR. STARK.  Read all of the handouts given to you by your recovery room nurse today.

## 2014-03-28 NOTE — Progress Notes (Signed)
Called to room to assist during endoscopic procedure.  Patient ID and intended procedure confirmed with present staff. Received instructions for my participation in the procedure from the performing physician.  

## 2014-03-28 NOTE — Op Note (Signed)
[place]  [address] [place], [id number]   COLONOSCOPY PROCEDURE REPORT  PATIENT: Michael Jacobs, Michael Jacobs  MR#: [id number] BIRTHDATE: 1970-10-22 , 43  yrs. old GENDER: male ENDOSCOPIST: Ladene Artist, MD, [facility] REFERRED EX:[id number] Shawna Orleans, DO PROCEDURE DATE:  03/28/2014 PROCEDURE:   Colonoscopy with biopsy and Colonoscopy with snare polypectomy First Screening Colonoscopy - Avg.  risk and is 50 yrs.  old or older Yes.  Prior Negative Screening - Now for repeat screening. N/A  History of Adenoma - Now for follow-up colonoscopy & has been > or = to 3 yrs.  N/A  Polyps Removed Today? Yes. ASA CLASS:   Class II INDICATIONS:average risk for colorectal cancer and patient's family history of colon polyps. MEDICATIONS: Monitored anesthesia care, Propofol 450 mg IV, and Residual sedation present DESCRIPTION OF PROCEDURE:   After the risks benefits and alternatives of the procedure were thoroughly explained, informed consent was obtained.  The digital rectal exam revealed no abnormalities of the rectum.   The LB [id number] and LB [id number]  endoscope was introduced through the anus and advanced to the terminal ileum which was intubated for a short distance. No adverse events experienced.   The quality of the prep was excellent, using MoviPrep  The instrument was then slowly withdrawn as the colon was fully examined.  COLON FINDINGS: A semi-pedunculated polyp measuring 7 mm in size was found at the cecum.  A polypectomy was performed with a cold snare. The resection was complete, the polyp tissue was completely retrieved and sent to histology.   A single non-bleeding, clean-based and shallow ulcer was found at the ileocecal valve. Biopsies were taken at the center of the ulcer and at edge of the ulcer.   The examined terminal ileum appeared to be normal.   There was mild diverticulosis noted in the sigmoid colon.   The examination was otherwise normal.   Retroflexed views revealed internal Grade I hemorrhoids. The time to cecum=1 minutes 48 seconds.  Withdrawal time=10 minutes 13 seconds.  The scope was withdrawn and the procedure completed. COMPLICATIONS: There were no immediate complications.  ENDOSCOPIC IMPRESSION: 1.   Semi-pedunculated at the cecum; polypectomy performed with a cold snare 2.   Single ulcer at the ileocecal valve; biopsies were taken 3.   The examined terminal ileum appeared to be normal 4.   Mild diverticulosis was noted in the sigmoid colon 5.   Grade l internal hemorrhoids  RECOMMENDATIONS: 1.  Await pathology results 2.  High fiber diet with liberal fluid intake. 3.  Repeat Colonoscopy in 5 years.  eSigned:  Ladene Artist, MD, [facility] 03/28/2014 4:14 PM

## 2014-03-28 NOTE — Op Note (Signed)
[place]  [address] [place], [address]   ENDOSCOPY PROCEDURE REPORT  PATIENT: Michael, Jacobs  MR#: [id number] BIRTHDATE: Aug 23, 1970 , 43  yrs. old GENDER: male ENDOSCOPIST: Ladene Artist, MD, [facility] REFERRED BY:  Drema Pry, DO PROCEDURE DATE:  03/28/2014 PROCEDURE:  EGD w/ biopsy and EGD w/ snare technique ASA CLASS:     Class II INDICATIONS:  history of esophageal reflux. MEDICATIONS: Monitored anesthesia care and Propofol 300 mg IV TOPICAL ANESTHETIC: none DESCRIPTION OF PROCEDURE: After the risks benefits and alternatives of the procedure were thoroughly explained, informed consent was obtained.  The LB [id number] endoscope was introduced through the mouth and advanced to the second portion of the duodenum , Without limitations.  The instrument was slowly withdrawn as the mucosa was fully examined.    ESOPHAGUS: The z-line appeared irregular varying about 1 cm.  The esophagus was otherwise normal. Multiple biopsies were performed.  STOMACH: A semipedunculated polyp measuring 8 mm in size was found in the gastric antrum.  A polypectomy was performed with snare cautery.  The resection was complete and the polyp tissue was completely retrieved.   The stomach otherwise appeared normal. DUODENUM: The duodenal mucosa showed no abnormalities in the bulb and 2nd part of the duodenum.  Retroflexed views revealed a small hiatal hernia.     The scope was then withdrawn from the patient and the procedure completed.  COMPLICATIONS: There were no immediate complications.  ENDOSCOPIC IMPRESSION: 1.   Varible z-line; biopsied 2.   Small hiatal hernia 3.   Polyp in the gastric antrum; polypectomy performed  RECOMMENDATIONS: 1.  Anti-reflux regimen long term 2.  Await pathology results 3.  Continue PPI bid 4.  No ASA/NSAID products for 2 weeks 5.  Office appt in 6 weeks  eSigned:  Ladene Artist, MD, [facility] 03/28/2014 4:09 PM

## 2014-03-28 NOTE — Progress Notes (Signed)
Report to PACU, RN, vss, BBS= Clear.  

## 2014-03-28 NOTE — Progress Notes (Signed)
Pt finished prep and 8 oz of mt dew by 11:00.  Mt dew 13:00 6 oz of mt dew per the pt.  Also hAD 4 OZ 13:20 PER THE PT. Margaretmary Eddy, RN reported this to [facility] Monday, CRNA  pT WILL BE THE LAST CASE OF THE AFTERNOON. MAW

## 2014-03-31 ENCOUNTER — Telehealth: Payer: Self-pay

## 2014-03-31 ENCOUNTER — Other Ambulatory Visit (INDEPENDENT_AMBULATORY_CARE_PROVIDER_SITE_OTHER): Payer: 59

## 2014-03-31 ENCOUNTER — Ambulatory Visit (INDEPENDENT_AMBULATORY_CARE_PROVIDER_SITE_OTHER)
Admission: RE | Admit: 2014-03-31 | Discharge: 2014-03-31 | Disposition: A | Discharge status: Home / Self Care | Payer: 59 | Source: Ambulatory Visit | Attending: Gastroenterology | Admitting: Gastroenterology

## 2014-03-31 DIAGNOSIS — R1013 Epigastric pain: Secondary | ICD-10-CM

## 2014-03-31 LAB — CBC WITH DIFFERENTIAL/PLATELET
BASOS ABS: 0.1 10*3/uL (ref 0.0–0.1)
Basophils Relative: 0.6 % (ref 0.0–3.0)
Eosinophils Absolute: 0.1 10*3/uL (ref 0.0–0.7)
Eosinophils Relative: 1.5 % (ref 0.0–5.0)
HEMATOCRIT: 43.4 % (ref 39.0–52.0)
Hemoglobin: 14.5 g/dL (ref 13.0–17.0)
LYMPHS ABS: 3.3 10*3/uL (ref 0.7–4.0)
LYMPHS PCT: 32.7 % (ref 12.0–46.0)
MCHC: 33.4 g/dL (ref 30.0–36.0)
MCV: 89.3 fl (ref 78.0–100.0)
MONO ABS: 0.6 10*3/uL (ref 0.1–1.0)
Monocytes Relative: 5.6 % (ref 3.0–12.0)
NEUTROS ABS: 6 10*3/uL (ref 1.4–7.7)
Neutrophils Relative %: 59.6 % (ref 43.0–77.0)
Platelets: 303 10*3/uL (ref 150.0–400.0)
RBC: 4.87 Mil/uL (ref 4.22–5.81)
RDW: 13.4 % (ref 11.5–15.5)
WBC: 10 10*3/uL (ref 4.0–10.5)

## 2014-03-31 LAB — COMPREHENSIVE METABOLIC PANEL
ALK PHOS: 66 U/L (ref 39–117)
ALT: 53 U/L (ref 0–53)
AST: 33 U/L (ref 0–37)
Albumin: 4.1 g/dL (ref 3.5–5.2)
BUN: 14 mg/dL (ref 6–23)
CALCIUM: 9.2 mg/dL (ref 8.4–10.5)
CO2: 27 mEq/L (ref 19–32)
Chloride: 108 mEq/L (ref 96–112)
Creatinine, Ser: 1.1 mg/dL (ref 0.4–1.5)
GFR: 75.04 mL/min (ref >60.00)
Glucose, Bld: 128 mg/dL — ABNORMAL HIGH (ref 70–99)
Potassium: 4.2 mEq/L (ref 3.5–5.1)
SODIUM: 143 meq/L (ref 135–145)
TOTAL PROTEIN: 7 g/dL (ref 6.0–8.3)
Total Bilirubin: 0.5 mg/dL (ref 0.2–1.2)

## 2014-03-31 NOTE — Telephone Encounter (Signed)
Left a message at [phone] for the pt to call us back if any questions or concerns. maw

## 2014-03-31 NOTE — Telephone Encounter (Signed)
Pt aware will be in today for labs and xray

## 2014-03-31 NOTE — Telephone Encounter (Signed)
Snare removal of polyp in colon and polyp in stomach.  Unlikely to have signficant complication but given the persistence of his symptoms needs workup: cbc, cmet, plain films (flat and upright abd films).

## 2014-03-31 NOTE — Telephone Encounter (Signed)
Pt had egd/ colon Friday with Dr. Fuller Plan.Saturday am pt woke up with epigastric pain that goes through his chest to his shoulder blades.  Pt takes his Protonix and carafate BID but took them TID Saturday d/t pain.   Sunday pt called the answering service and Dr. Benson Norway called him back.  He said pain could be from the multiple biopsies taken.  Pain was worse after eating food. Now the pt c/o epigastric pain 3 on the pain scale.  He would like some advise per the pt, "since I did not have pain before the procedure".  Please advise since you are the Doc of the day.  Pt uses [facility].  Can reach the pt at [phone].

## 2014-04-02 ENCOUNTER — Telehealth: Payer: Self-pay

## 2014-04-02 ENCOUNTER — Encounter: Payer: Self-pay | Admitting: Gastroenterology

## 2014-04-02 ENCOUNTER — Ambulatory Visit (INDEPENDENT_AMBULATORY_CARE_PROVIDER_SITE_OTHER): Payer: 59 | Admitting: Gastroenterology

## 2014-04-02 ENCOUNTER — Ambulatory Visit (HOSPITAL_COMMUNITY)
Admission: RE | Admit: 2014-04-02 | Discharge: 2014-04-02 | Disposition: A | Discharge status: Home / Self Care | Payer: 59 | Source: Ambulatory Visit | Attending: Gastroenterology | Admitting: Gastroenterology

## 2014-04-02 ENCOUNTER — Other Ambulatory Visit: Payer: Self-pay

## 2014-04-02 VITALS — BP 138/92 | HR 76 | Ht 73.0 in | Wt 282.2 lb

## 2014-04-02 DIAGNOSIS — I10 Essential (primary) hypertension: Secondary | ICD-10-CM | POA: Diagnosis not present

## 2014-04-02 DIAGNOSIS — K760 Fatty (change of) liver, not elsewhere classified: Secondary | ICD-10-CM | POA: Diagnosis not present

## 2014-04-02 DIAGNOSIS — R1013 Epigastric pain: Secondary | ICD-10-CM

## 2014-04-02 DIAGNOSIS — E669 Obesity, unspecified: Secondary | ICD-10-CM | POA: Diagnosis not present

## 2014-04-02 DIAGNOSIS — E119 Type 2 diabetes mellitus without complications: Secondary | ICD-10-CM | POA: Insufficient documentation

## 2014-04-02 MED ORDER — HYDROCODONE-ACETAMINOPHEN 5-325 MG PO TABS: 1.0000 | ORAL_TABLET | Freq: Four times a day (QID) | ORAL | Status: DC | PRN

## 2014-04-02 NOTE — Patient Instructions (Addendum)
You will be set up for an ultrasound for intermittent epigastric pains. You have been scheduled for an abdominal ultrasound at [facility] (1st floor of hospital) on TODAY at [place]. Please arrive 15 minutes prior to your appointment for registration. Make certain not to have anything to eat or drink 6 hours prior to your appointment. Should you need to reschedule your appointment, please contact radiology at [phone]. This test typically takes about 30 minutes to perform.

## 2014-04-02 NOTE — Telephone Encounter (Signed)
Patient reports pain after eating yesterday and this am.  He reports pain in his back and under his shoulder blades.  He reports he tried OTC products to relieve the pain with no improvement last night.  He will come in today and see Dr. Ardis Hughs today at 1:30

## 2014-04-02 NOTE — Progress Notes (Signed)
You have been scheduled for a CT scan of the abdomen and pelvis at [place] ([address]---this is in the same building as [facility]).   You are scheduled on 04/07/14 at 1:15 pm. You should arrive 15 minutes prior to your appointment time for registration. Please follow the written instructions below on the day of your exam:  WARNING: IF YOU ARE ALLERGIC TO IODINE/X-RAY DYE, PLEASE NOTIFY RADIOLOGY IMMEDIATELY AT [phone]! YOU WILL BE GIVEN A 13 HOUR PREMEDICATION PREP.  1) Do not eat or drink anything after 9 am (4 hours prior to your test) 2) You have been given 2 bottles of oral contrast to drink. The solution may taste  better if refrigerated, but do NOT add ice or any other liquid to this solution. Shake well before drinking.    Drink 1 bottle of contrast @ 11 am (2 hours prior to your exam)  Drink 1 bottle of contrast @ 12 noon (1 hour prior to your exam)  You may take any medications as prescribed with a small amount of water except for the following: Metformin, Glucophage, Glucovance, Avandamet, Riomet, Fortamet, Actoplus Met, Janumet, Glumetza or Metaglip. The above medications must be held the day of the exam AND 48 hours after the exam.  The purpose of you drinking the oral contrast is to aid in the visualization of your intestinal tract. The contrast solution may cause some diarrhea. Before your exam is started, you will be given a small amount of fluid to drink. Depending on your individual set of symptoms, you may also receive an intravenous injection of x-ray contrast/dye. Plan on being at [facility] for 30 minutes or long, depending on the type of exam you are having performed.   This test typically takes 30-45 minutes to complete.  If you have any questions regarding your exam or if you need to reschedule, you may call the CT department at [phone] between the hours of 8:00 am and 5:00 pm,  Monday-Friday.  ________________________________________________________________________   The pt is aware and will pick up instructions and contrast tomorrow   He wants to know if he can have something for pain until the test, he is unable to eat and sleep.  Please advise

## 2014-04-02 NOTE — Progress Notes (Signed)
HPI: This is a  very pleasant 43 year old man whom I am meeting for the first time today.  4-5 days ago he underwent colonoscopy and upper endoscopy. Colonoscopy found small cecal polyp which was removed by cold snare. Ileocecal ulceration was biopsied as well. Upper endoscopy found irregular Z line which was biopsied as well as a fairly small gastric polyp which was removed by cold snare.  Blood work 2 days ago including CBC and complete metabolic profile were both normal. Abdominal plain films was done the same day and this was also normal without free air or obstruction.  Was absolutely fine for first 12 hours after the procedures. THe morning afterwards, intermittent epigastric discomfort with some radiation to his back.  Took his meds including PPI carafate and felt a bit better.    THe next AM he awoke because of pain in epigastrium.  Took PPI, tums, carafate and over time he improved.  He does not have significant nausea but describes the pain as way like pains midepigastrium radiating to his back and under his shoulder blades.  Past Medical History  Diagnosis Date  . GERD (gastroesophageal reflux disease)   . Hypertension   . Hypercholesterolemia   . Diabetes mellitus   . Anxiety   . Asthma   . Hiatal hernia   . Cecal ulcer     Past Surgical History  Procedure Laterality Date  . Tonsillectomy and adenoidectomy    . Wisdom tooth extraction    . Upper gastrointestinal endoscopy      Current Outpatient Prescriptions  Medication Sig Dispense Refill  . albuterol (PROVENTIL HFA;VENTOLIN HFA) 108 (90 BASE) MCG/ACT inhaler Inhale 2 puffs into the lungs every 6 (six) hours as needed. 8.5 g 5  . ALPRAZolam (XANAX) 0.25 MG tablet Take 1 tablet (0.25 mg total) by mouth daily as needed for sleep or anxiety. 30 tablet 0  . amLODipine (NORVASC) 5 MG tablet TAKE 1 TABLET (5 MG TOTAL) BY MOUTH DAILY. 90 tablet 0  . fenofibrate 54 MG tablet Take 1 tablet (54 mg total) by mouth daily. 30  tablet 5  . losartan (COZAAR) 100 MG tablet TAKE 1 TABLET (100 MG TOTAL) BY MOUTH DAILY. 90 tablet 1  . metFORMIN (GLUCOPHAGE) 500 MG tablet Take 1 tablet (500 mg total) by mouth 3 (three) times daily with meals. 90 tablet 5  . pantoprazole (PROTONIX) 40 MG tablet Take 1 tablet (40 mg total) by mouth 2 (two) times daily. 60 tablet 4  . pravastatin (PRAVACHOL) 40 MG tablet TAKE 1 TABLET (40 MG TOTAL) BY MOUTH DAILY. 90 tablet 1  . sucralfate (CARAFATE) 1 GM/10ML suspension Take 10 mLs (1 g total) by mouth 4 (four) times daily. 420 mL 1   No current facility-administered medications for this visit.    Allergies as of 04/02/2014 - Review Complete 04/02/2014  Allergen Reaction Noted  . Ace inhibitors Cough 03/07/2012  . Phenergan [promethazine hcl] Anxiety 02/06/2014    Family History  Problem Relation Age of Onset  . Colon polyps Mother   . Colon cancer Maternal Grandmother   . Diabetes Maternal Grandmother   . Heart disease Maternal Grandmother   . Colon polyps Maternal Grandmother   . Breast cancer Paternal Grandmother   . Stomach cancer Paternal Grandmother     mets to stomach  . Prostate cancer Maternal Grandfather   . Esophageal cancer Neg Hx   . Rectal cancer Neg Hx     History   Social History  . Marital Status:  Married    Spouse Name: N/A    Number of Children: 2  . Years of Education: N/A   Occupational History  .     Social History Main Topics  . Smoking status: Former Smoker    Types: Cigarettes, Cigars  . Smokeless tobacco: Never Used  . Alcohol Use: Yes     Comment: occ  . Drug Use: No  . Sexual Activity: Not on file   Other Topics Concern  . Not on file   Social History Narrative      Physical Exam: BP 138/92 mmHg  Pulse 76  Ht 6\' 1"  (1.854 m)  Wt 282 lb 3.2 oz (128.005 kg)  BMI 37.24 kg/m2 Constitutional: generally well-appearing Psychiatric: alert and oriented x3 Abdomen: soft, nontender, nondistended, no obvious ascites, no peritoneal  signs, normal bowel sounds     Assessment and plan: 43 y.o. male with intermittent epigastric pains since upper endoscopy colonoscopy 4-5 days ago.  He is not tender at all on examination. His white blood cell count 2 days ago was normal and he has had no fevers. My suspicion for complication following procedure such as perforation is very low after meeting him here in the office today. I'm more concerned that he may have ileocolic, gallstone disease. I can't explain why that would become symptomatic after endoscopic procedures but perhaps that is truly the case here. He has not eaten today and we will see if we can arrange expedited abdominal ultrasound to check for gallbladder, gallstone disease.

## 2014-04-02 NOTE — Telephone Encounter (Signed)
You have been scheduled for a CT scan of the abdomen and pelvis at [facility] are scheduled on 04/03/14 at 845 AM. You should arrive 15 minutes prior to your appointment time for registration. Please follow the written instructions below on the day of your exam:  WARNING: IF YOU ARE ALLERGIC TO IODINE/X-RAY DYE, PLEASE NOTIFY RADIOLOGY IMMEDIATELY AT [phone]! YOU WILL BE GIVEN A 13 HOUR PREMEDICATION PREP.  1) Do not eat or drink anything after 445 AM (4 hours prior to your test) 2) You have been given 2 bottles of oral contrast to drink. The solution may taste better if refrigerated, but do NOT add ice or any other liquid to this solution. Shake well before drinking.    Drink 1 bottle of contrast @ 172 AM (2 hours prior to your exam)  Drink 1 bottle of contrast @ 745 AM (1 hour prior to your exam)  You may take any medications as prescribed with a small amount of water except for the following: Metformin, Glucophage, Glucovance, Avandamet, Riomet, Fortamet, Actoplus Met, Janumet, Glumetza or Metaglip. The above medications must be held the day of the exam AND 48 hours after the exam.  The purpose of you drinking the oral contrast is to aid in the visualization of your intestinal tract. The contrast solution may cause some diarrhea. Before your exam is started, you will be given a small amount of fluid to drink. Depending on your individual set of symptoms, you may also receive an intravenous injection of x-ray contrast/dye. Plan on being at [facility] for 30 minutes or long, depending on the type of exam you are having performed.  This test typically takes 30-45 minutes to complete.  If you have any questions regarding your exam or if you need to reschedule, you may call the CT department at [phone] between the hours of 8:00 am and 5:00 pm, Monday-Friday.  ________________________________________________________________________

## 2014-04-02 NOTE — Telephone Encounter (Signed)
PT PRESCRIPTION DID NOT PRINT THE FIRST TIME REPRINTED

## 2014-04-03 ENCOUNTER — Ambulatory Visit
Admission: RE | Admit: 2014-04-03 | Discharge: 2014-04-03 | Disposition: A | Discharge status: Home / Self Care | Payer: 59 | Source: Ambulatory Visit | Attending: Gastroenterology | Admitting: Gastroenterology

## 2014-04-03 ENCOUNTER — Telehealth: Payer: Self-pay

## 2014-04-03 DIAGNOSIS — R1013 Epigastric pain: Secondary | ICD-10-CM

## 2014-04-03 MED ORDER — IOHEXOL 300 MG/ML  SOLN
125.0000 mL | Freq: Once | INTRAMUSCULAR | Status: AC | PRN
  Administered 2014-04-03: 125 mL via INTRAVENOUS

## 2014-04-03 NOTE — Telephone Encounter (Signed)
Call report to Dr Ardis Hughs, per Dr Ardis Hughs recommendations let pt know nothing serious, he should continue to improve, if not, go to the ER for worsening symptoms.  Pt agreed and will call on Monday if not better.

## 2014-04-07 ENCOUNTER — Telehealth: Payer: Self-pay | Admitting: Gastroenterology

## 2014-04-07 ENCOUNTER — Encounter: Payer: Self-pay | Admitting: Gastroenterology

## 2014-04-07 ENCOUNTER — Inpatient Hospital Stay: Admission: RE | Admit: 2014-04-07 | Payer: 59 | Source: Ambulatory Visit

## 2014-04-07 ENCOUNTER — Telehealth: Payer: Self-pay | Admitting: Internal Medicine

## 2014-04-07 NOTE — Telephone Encounter (Signed)
Pt aware that Dr Ardis Hughs has not reviewed results and a I will call as soon as available.

## 2014-04-07 NOTE — Telephone Encounter (Signed)
Dr. Shawna Orleans, please see CT scan and let me know if there is anything additional I need to inform patient.  Thanks

## 2014-04-07 NOTE — Telephone Encounter (Signed)
Pt had ct scan that was abnormal and dr Ardis Hughs has sent message to dr Shawna Orleans. Cindy please call pt

## 2014-04-07 NOTE — Telephone Encounter (Signed)
Rob, I spoke with him about CT; specifically small pulm nodule and right fem head avascular necrosis (both incidental findings on this abd/pelvic scan done for post EGD, colonoscopy pain that is actually slowly resolving).  He is somewhat concerned and would like you to review and get back to him in next 1-2 days.  I tried to answer his questions as best I could but he'd still like to hear from you.  Thanks  dj

## 2014-04-08 NOTE — Telephone Encounter (Signed)
12/28 - pt has called back again.  He states that he is still waiting for his biopsy results and is not sure why Dr. Deatra Ina is going to call him.  He states Dr. Fuller Plan did his procedure.  He has called 3 times this morning and is really worried.

## 2014-04-08 NOTE — Telephone Encounter (Signed)
Please call patient with his colon and egd biopsy results. Path letter is done and will be coming to him soon.

## 2014-04-08 NOTE — Telephone Encounter (Signed)
I suggest we schedule OV to discuss.  You can overbook him the first day I am back.

## 2014-04-08 NOTE — Telephone Encounter (Signed)
Dr Ardis Hughs does this need to go to Dr Fuller Plan not Dr Deatra Ina, I dont see where Dr Deatra Ina has seen the pt. The pt has called 3 times this morning about biopsy results.  I see where you spoke to the pt this morning, please advise

## 2014-04-08 NOTE — Telephone Encounter (Signed)
Called pt to discuss incidental findings on CT of abd and pelvis with IV contrast.  Pt noted to have 5.5 mm x 3.3 mm nodule of right lower lobe.  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  He is not experiencing any unusual pulmonary symptoms.  He reports hx of mild intermittent right hip pain.  He denies taking steroids in the past.  He is former smoker.  He reports smoking for 3-4 years.  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  He expressed understanding as has made an appt for 1/13.  Patient reassured.

## 2014-04-08 NOTE — Telephone Encounter (Signed)
The "Rob" I wrote to and sent message to via epic is Freddie Apley, DO; not Dr. Deatra Ina.  Dr. Fuller Plan did the procedures for Mr. Decker and so biopsy questions should go to him.  I'll forward this chain to Dr. Fuller Plan

## 2014-04-08 NOTE — Telephone Encounter (Signed)
Pt states that he does not want to wait all weekend.  He is stressed out and can't sleep.  He is requesting that Dr Shawna Orleans call him personally to ease his mind

## 2014-04-08 NOTE — Telephone Encounter (Signed)
Informed patient of EGD/Colon biopsy results. Patient was very concerned of the 5 year colon recall recommendation. Patient states he has a Grandmother that had colon cancer at age 41 and had repeat colonoscopies every year. He states since he has had precancerous polyps at age 59, he would like to know if he can have his recalls sooner. I assured patient that this is the doctor's recommendations but he states he feels strongly about this and would like them done yearly.  Please advise.

## 2014-04-09 NOTE — Telephone Encounter (Signed)
Spoke with patient and tried to reassure patient the 5 year recall for a Colonoscopy is medically appropriate. Patient states he would still feel more comfortable with a 3 year recall but will consider a 5 year recall. Patient states he will wait til it gets closer to his recall and decide whether he will stay with our group or want a sooner recall and switch to another practice. Told patient that another practice will have the same 5 year recall according to medical guidelines. Patient states he thinks that his mother has a sooner recall with the same history and might go to that practice. He does state that he does want to stay with [facility] but is just really worried.  Told patient that was up to him to decide but maybe he should consider coming in to speak with Dr. Fuller Plan first when it gets closer to his recall and then decide. Patient agreed and will call in 3 years or sooner to discuss recall.

## 2014-04-09 NOTE — Telephone Encounter (Signed)
Please reassure him that I have review his records and that polyps are typically very slow growing lesions.  5 years is the medically appropriate interval for repeat surveillance colonoscopy.

## 2014-04-16 ENCOUNTER — Other Ambulatory Visit: Payer: Self-pay | Admitting: Gastroenterology

## 2014-04-16 ENCOUNTER — Other Ambulatory Visit: Payer: Self-pay | Admitting: Internal Medicine

## 2014-04-16 ENCOUNTER — Other Ambulatory Visit (INDEPENDENT_AMBULATORY_CARE_PROVIDER_SITE_OTHER): Payer: Self-pay | Admitting: Physician Assistant

## 2014-04-17 MED ORDER — GLYCOPYRROLATE 2 MG PO TABS: 2.0000 mg | ORAL_TABLET | Freq: Two times a day (BID) | ORAL | Status: DC

## 2014-04-17 MED ORDER — HYDROCODONE-ACETAMINOPHEN 5-325 MG PO TABS: 1.0000 | ORAL_TABLET | Freq: Four times a day (QID) | ORAL | Status: DC | PRN

## 2014-04-17 NOTE — Addendum Note (Signed)
Addended by: Barron Alvine on: 04/17/2014 11:29 AM   Modules accepted: Orders

## 2014-04-17 NOTE — Telephone Encounter (Signed)
Medication was refilled in error per Dr Ardis Hughs needs to be reviewed by Dr Fuller Plan.  Prescription printed and was not given to the pt.

## 2014-04-23 ENCOUNTER — Ambulatory Visit: Payer: 59 | Admitting: Internal Medicine

## 2014-04-25 ENCOUNTER — Ambulatory Visit (INDEPENDENT_AMBULATORY_CARE_PROVIDER_SITE_OTHER): Payer: 59 | Admitting: Internal Medicine

## 2014-04-25 ENCOUNTER — Encounter: Payer: Self-pay | Admitting: Internal Medicine

## 2014-04-25 VITALS — BP 132/90 | HR 89 | Temp 98.2°F | Resp 20 | Ht 73.0 in | Wt 288.0 lb

## 2014-04-25 DIAGNOSIS — E119 Type 2 diabetes mellitus without complications: Secondary | ICD-10-CM

## 2014-04-25 DIAGNOSIS — E785 Hyperlipidemia, unspecified: Secondary | ICD-10-CM

## 2014-04-25 DIAGNOSIS — I10 Essential (primary) hypertension: Secondary | ICD-10-CM

## 2014-04-25 DIAGNOSIS — M87051 Idiopathic aseptic necrosis of right femur: Secondary | ICD-10-CM

## 2014-04-25 LAB — HEMOGLOBIN A1C: HEMOGLOBIN A1C: 6.8 % — AB (ref 4.6–6.5)

## 2014-04-25 NOTE — Progress Notes (Signed)
Pre visit review using our clinic review tool, if applicable. No additional management support is needed unless otherwise documented below in the visit note. 

## 2014-04-25 NOTE — Progress Notes (Signed)
Subjective:    Patient ID: Michael Jacobs, male    DOB: December 23, 1970, 44 y.o.   MRN: [id number]  HPI  44 year old patient who has type 2 diabetes.  He has exogenous obesity. Last month he underwent colonoscopy and EGD.  He subsequently developed epigastric pain and had an abdominal CT scan performed to rule out perforation.  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  He also had abdominal radiographs performed last month and radiographically right hip appeared normal.  He denies any hip pain.  Examination today revealed full range of motion In general doing quite well.  No GI symptoms.  He complains about inability to lose weight.  He is exercising regularly.  Last hemoglobin A1c, well-controlled  CT scan also revealed a nodule in the right mid lung field.  Follow-up in 6 months recommended  Past Medical History  Diagnosis Date  . GERD (gastroesophageal reflux disease)   . Hypertension   . Hypercholesterolemia   . Diabetes mellitus   . Anxiety   . Asthma   . Hiatal hernia   . Cecal ulcer     History   Social History  . Marital Status: Married    Spouse Name: N/A    Number of Children: 2  . Years of Education: N/A   Occupational History  .     Social History Main Topics  . Smoking status: Former Smoker    Types: Cigarettes, Cigars  . Smokeless tobacco: Never Used  . Alcohol Use: 0.0 oz/week    0 Not specified per week     Comment: occ  . Drug Use: No  . Sexual Activity: Not on file   Other Topics Concern  . Not on file   Social History Narrative    Past Surgical History  Procedure Laterality Date  . Tonsillectomy and adenoidectomy    . Wisdom tooth extraction    . Upper gastrointestinal endoscopy      Family History  Problem Relation Age of Onset  . Colon polyps Mother   . Colon cancer Maternal Grandmother   . Diabetes Maternal Grandmother   . Heart disease Maternal Grandmother   . Colon polyps Maternal Grandmother   . Breast cancer Paternal  Grandmother   . Stomach cancer Paternal Grandmother     mets to stomach  . Prostate cancer Maternal Grandfather   . Esophageal cancer Neg Hx   . Rectal cancer Neg Hx     Allergies  Allergen Reactions  . Ace Inhibitors Cough  . Phenergan [Promethazine Hcl] Anxiety    Current Outpatient Prescriptions on File Prior to Visit  Medication Sig Dispense Refill  . albuterol (PROVENTIL HFA;VENTOLIN HFA) 108 (90 BASE) MCG/ACT inhaler Inhale 2 puffs into the lungs every 6 (six) hours as needed. 8.5 g 5  . ALPRAZolam (XANAX) 0.25 MG tablet Take 1 tablet (0.25 mg total) by mouth daily as needed for sleep or anxiety. 30 tablet 0  . amLODipine (NORVASC) 5 MG tablet TAKE 1 TABLET (5 MG TOTAL) BY MOUTH DAILY. 90 tablet 0  . fenofibrate 54 MG tablet Take 1 tablet (54 mg total) by mouth daily. 30 tablet 5  . HYDROcodone-acetaminophen (NORCO/VICODIN) 5-325 MG per tablet Take 1 tablet by mouth every 6 (six) hours as needed for moderate pain. 35 tablet 0  . losartan (COZAAR) 100 MG tablet TAKE 1 TABLET (100 MG TOTAL) BY MOUTH DAILY. 90 tablet 1  . metFORMIN (GLUCOPHAGE) 500 MG tablet Take 1 tablet (500 mg total) by mouth 3 (  three) times daily with meals. (Patient taking differently: Take 500 mg by mouth 2 (two) times daily with a meal. ) 90 tablet 5  . pantoprazole (PROTONIX) 40 MG tablet Take 1 tablet (40 mg total) by mouth 2 (two) times daily. 60 tablet 4  . pravastatin (PRAVACHOL) 40 MG tablet TAKE 1 TABLET (40 MG TOTAL) BY MOUTH DAILY. 90 tablet 1  . glycopyrrolate (ROBINUL) 2 MG tablet Take 1 tablet (2 mg total) by mouth 2 (two) times daily. (Patient not taking: Reported on 04/25/2014) 180 tablet 3  . sucralfate (CARAFATE) 1 GM/10ML suspension Take 10 mLs (1 g total) by mouth 4 (four) times daily. (Patient not taking: Reported on 04/25/2014) 420 mL 1   No current facility-administered medications on file prior to visit.    BP 132/90 mmHg  Pulse 89  Temp(Src) 98.2 F (36.8 C) (Oral)  Resp 20  Ht 6\' 1"   (1.854 m)  Wt 288 lb (130.636 kg)  BMI 38.01 kg/m2  SpO2 98%     Review of Systems  Constitutional: Positive for unexpected weight change. Negative for fever, chills, appetite change and fatigue.  HENT: Negative for congestion, dental problem, ear pain, hearing loss, sore throat, tinnitus, trouble swallowing and voice change.   Eyes: Negative for pain, discharge and visual disturbance.  Respiratory: Negative for cough, chest tightness, wheezing and stridor.   Cardiovascular: Negative for chest pain, palpitations and leg swelling.  Gastrointestinal: Negative for nausea, vomiting, abdominal pain, diarrhea, constipation, blood in stool and abdominal distention.  Genitourinary: Negative for urgency, hematuria, flank pain, discharge, difficulty urinating and genital sores.  Musculoskeletal: Negative for myalgias, back pain, joint swelling, arthralgias, gait problem and neck stiffness.  Skin: Negative for rash.  Neurological: Negative for dizziness, syncope, speech difficulty, weakness, numbness and headaches.  Hematological: Negative for adenopathy. Does not bruise/bleed easily.  Psychiatric/Behavioral: Negative for behavioral problems and dysphoric mood. The patient is not nervous/anxious.        Objective:   Physical Exam  Constitutional: He appears well-developed and well-nourished. No distress.  Blood pressure 130/80  Musculoskeletal:  Full range of motion without pain.  Both hips          Assessment & Plan:   Diabetes mellitus.  Will check a hemoglobin A1c Hypertension, fair control.  Exercise, weight loss.  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  Will review with radiology Right middle lobe pulmonary nodule 3 x 5 mm.  Follow-up CT scan in 6 months

## 2014-04-25 NOTE — Patient Instructions (Signed)
Please check your hemoglobin A1c every 3 months  Limit your sodium (Salt) intake     It is important that you exercise regularly, at least 20 minutes 3 to 4 times per week.  If you develop chest pain or shortness of breath seek  medical attention.  Follow-up CT of the chest in 6 months as discussed   You need to lose weight.  Consider a lower calorie diet and regular exercise.

## 2014-04-28 ENCOUNTER — Telehealth: Payer: Self-pay | Admitting: Internal Medicine

## 2014-04-28 NOTE — Telephone Encounter (Signed)
emmi emailed °

## 2014-05-05 ENCOUNTER — Other Ambulatory Visit: Payer: Self-pay | Admitting: Internal Medicine

## 2014-05-06 ENCOUNTER — Telehealth: Payer: Self-pay | Admitting: Internal Medicine

## 2014-05-06 NOTE — Telephone Encounter (Addendum)
Pt request refill ALPRAZolam (XANAX) 0.25 MG tablet [facility]  Pt had appt w/ dr Raliegh Ip on 1/15,  bc Dr Shawna Orleans was out.

## 2014-05-07 MED ORDER — ALPRAZOLAM 0.25 MG PO TABS: 0.2500 mg | ORAL_TABLET | Freq: Every day | ORAL | Status: DC | PRN

## 2014-05-07 NOTE — Telephone Encounter (Signed)
Ok to RF x 2

## 2014-05-07 NOTE — Telephone Encounter (Signed)
rx called in to [facility]

## 2014-06-26 ENCOUNTER — Telehealth: Payer: Self-pay | Admitting: Internal Medicine

## 2014-06-26 NOTE — Telephone Encounter (Signed)
Michael Jacobs,  This pt was seen by Dr. Raliegh Ip in January for right hip pain.  Can you ask him to review note.

## 2014-06-26 NOTE — Telephone Encounter (Signed)
MRI was denied.  See previous phone note.  Sent to Dr Shawna Orleans for review

## 2014-06-26 NOTE — Telephone Encounter (Signed)
Pt had ct scan on lungs and per pt they recommended pt to have mri of chest. Pt has spots on lungs. Pt would like to have mri of hip that [facility] imaging per pt did not call him. Pt would like to have both tests done on same day. Can we order?

## 2014-06-26 NOTE — Telephone Encounter (Signed)
Dr Shawna Orleans pt  MR HIP RIGHT WO CONTRAST  was denied by insurance company the case has been sent to clinical review. if additional clinical information is needed. If you wish to speak with a representative at anytime, please call [phone].  [employer] to provide clinicals CPT Code 19622 Case Number: [id number]   Patient Id: [id number] Please advise asking for the MD to call to provide more information

## 2014-06-27 NOTE — Telephone Encounter (Signed)
Routed to Dr Raliegh Ip for review

## 2014-07-18 ENCOUNTER — Encounter: Payer: Self-pay | Admitting: Internal Medicine

## 2014-07-25 ENCOUNTER — Ambulatory Visit (INDEPENDENT_AMBULATORY_CARE_PROVIDER_SITE_OTHER): Payer: 59 | Admitting: Internal Medicine

## 2014-07-25 ENCOUNTER — Encounter: Payer: Self-pay | Admitting: Internal Medicine

## 2014-07-25 VITALS — BP 140/90 | HR 97 | Temp 98.4°F | Ht 73.0 in | Wt 282.0 lb

## 2014-07-25 DIAGNOSIS — R938 Abnormal findings on diagnostic imaging of other specified body structures: Secondary | ICD-10-CM

## 2014-07-25 DIAGNOSIS — E119 Type 2 diabetes mellitus without complications: Secondary | ICD-10-CM

## 2014-07-25 DIAGNOSIS — R911 Solitary pulmonary nodule: Secondary | ICD-10-CM

## 2014-07-25 DIAGNOSIS — I10 Essential (primary) hypertension: Secondary | ICD-10-CM

## 2014-07-25 DIAGNOSIS — R9389 Abnormal findings on diagnostic imaging of other specified body structures: Secondary | ICD-10-CM

## 2014-07-25 MED ORDER — METFORMIN HCL 500 MG PO TABS: 500.0000 mg | ORAL_TABLET | Freq: Three times a day (TID) | ORAL | Status: DC

## 2014-07-25 MED ORDER — PRAVASTATIN SODIUM 40 MG PO TABS: ORAL_TABLET | ORAL | Status: DC

## 2014-07-25 MED ORDER — AMLODIPINE BESYLATE 5 MG PO TABS: ORAL_TABLET | ORAL | Status: DC

## 2014-07-25 MED ORDER — FENOFIBRATE 54 MG PO TABS: 54.0000 mg | ORAL_TABLET | Freq: Every day | ORAL | Status: DC

## 2014-07-25 MED ORDER — LOSARTAN POTASSIUM 100 MG PO TABS: ORAL_TABLET | ORAL | Status: DC

## 2014-07-25 NOTE — Assessment & Plan Note (Addendum)
CT of abdomen and pelvis performed on December 2015 showed incidental avascular necrosis of right hip. Awaiting follow-up MRI of right hip.  He reports his insurance co recently approved MRI.

## 2014-07-25 NOTE — Progress Notes (Signed)
Subjective:    Patient ID: Michael Jacobs, male    DOB: 1970-09-08, 44 y.o.   MRN: [id number]  HPI  44 year old white male with history of type 2 diabetes, hypertension or hyperlipidemia for follow-up. Interval medical history-patient underwent CT of abdomen and pelvis in December 2015 for diverticulitis. CT scan has several incidental findings. Patient noted to have right middle lobe 5.5 x 3.3 mm noncalcified nodule. Radiologist recommended a follow-up CT scan in 6-12 months. 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  He has knee pain but usually does not have significant right hip pain. He was seen by Dr. Burnice Logan and MRI of right hip was ordered. Dr. Burnice Logan discussed CT findings with radiologist.  Hehas remote history of smoking. He was a social smoker.  Paternal Grandmother had lung cancer.  Hypertension-patient forgot his blood pressure medication today.  Diabetes mellitus type 2-his current job requires more physical activity. He has lost 8 pounds with no last 6 weeks. She reports good medication compliance  Review of Systems Mild weight loss, negative for chest pain.  Drinks gallon of water per day    Past Medical History  Diagnosis Date  . GERD (gastroesophageal reflux disease)   . Hypertension   . Hypercholesterolemia   . Diabetes mellitus   . Anxiety   . Asthma   . Hiatal hernia   . Cecal ulcer     History   Social History  . Marital Status: Married    Spouse Name: N/A  . Number of Children: 2  . Years of Education: N/A   Occupational History  .     Social History Main Topics  . Smoking status: Former Smoker    Types: Cigarettes, Cigars  . Smokeless tobacco: Never Used  . Alcohol Use: 0.0 oz/week    0 Standard drinks or equivalent per week     Comment: occ  . Drug Use: No  . Sexual Activity: Not on file   Other Topics Concern  . Not on file   Social History Narrative    Past  Surgical History  Procedure Laterality Date  . Tonsillectomy and adenoidectomy    . Wisdom tooth extraction    . Upper gastrointestinal endoscopy      Family History  Problem Relation Age of Onset  . Colon polyps Mother   . Colon cancer Maternal Grandmother   . Diabetes Maternal Grandmother   . Heart disease Maternal Grandmother   . Colon polyps Maternal Grandmother   . Breast cancer Paternal Grandmother   . Stomach cancer Paternal Grandmother     mets to stomach  . Prostate cancer Maternal Grandfather   . Esophageal cancer Neg Hx   . Rectal cancer Neg Hx     Allergies  Allergen Reactions  . Ace Inhibitors Cough  . Phenergan [Promethazine Hcl] Anxiety    Current Outpatient Prescriptions on File Prior to Visit  Medication Sig Dispense Refill  . albuterol (PROVENTIL HFA;VENTOLIN HFA) 108 (90 BASE) MCG/ACT inhaler Inhale 2 puffs into the lungs every 6 (six) hours as needed. 8.5 g 5  . ALPRAZolam (XANAX) 0.25 MG tablet Take 1 tablet (0.25 mg total) by mouth daily as needed for sleep or anxiety. 30 tablet 2  . amLODipine (NORVASC) 5 MG tablet TAKE 1 TABLET (5 MG TOTAL) BY MOUTH DAILY. 90 tablet 0  . fenofibrate 54 MG tablet Take 1 tablet (54 mg total) by mouth daily. 30 tablet 5  . glycopyrrolate (  ROBINUL) 2 MG tablet Take 1 tablet (2 mg total) by mouth 2 (two) times daily. 180 tablet 3  . losartan (COZAAR) 100 MG tablet TAKE 1 TABLET (100 MG TOTAL) BY MOUTH DAILY. 90 tablet 1  . metFORMIN (GLUCOPHAGE) 500 MG tablet Take 1 tablet (500 mg total) by mouth 3 (three) times daily with meals. (Patient taking differently: Take 500 mg by mouth 2 (two) times daily with a meal. ) 90 tablet 5  . pantoprazole (PROTONIX) 40 MG tablet Take 1 tablet (40 mg total) by mouth 2 (two) times daily. 60 tablet 4  . pravastatin (PRAVACHOL) 40 MG tablet TAKE 1 TABLET (40 MG TOTAL) BY MOUTH DAILY. 90 tablet 1   No current facility-administered medications on file prior to visit.    BP 140/90 mmHg  Pulse  97  Temp(Src) 98.4 F (36.9 C) (Oral)  Ht 6\' 1"  (1.854 m)  Wt 282 lb (127.914 kg)  BMI 37.21 kg/m2      Objective:   Physical Exam  Constitutional: He is oriented to person, place, and time. He appears well-developed and well-nourished. No distress.  HENT:  Head: Normocephalic and atraumatic.  Neck: Neck supple.  Cardiovascular: Normal rate, regular rhythm and normal heart sounds.   No murmur heard. Pulmonary/Chest: Effort normal and breath sounds normal. He has no wheezes.  Musculoskeletal: He exhibits no edema.  Neurological: He is alert and oriented to person, place, and time. No cranial nerve deficit.  Psychiatric: He has a normal mood and affect. His behavior is normal.          Assessment & Plan:

## 2014-07-25 NOTE — Patient Instructions (Signed)
Monitor your blood pressure at home as directed Please complete the following lab tests before your next follow up appointment: BMET, A1c, microalb / cr ratio - 250.00 FLP, LFTs, TSH - 272.4

## 2014-07-25 NOTE — Assessment & Plan Note (Addendum)
Patient doing well with metformin and dietary compliance.  His new job requires more physical activity.  Monitor A1c before next office visit.  Lab Results  Component Value Date   HGBA1C 6.8* 04/25/2014   HGBA1C 6.2 11/13/2013   HGBA1C 6.0 10/08/2012   Lab Results  Component Value Date   MICROALBUR 7.3* 11/13/2013   CREATININE 1.1 03/31/2014

## 2014-07-25 NOTE — Assessment & Plan Note (Signed)
CT of abdomen and pelvis performed in December 2015 for diverticulosis showed incidental pulmonary nodule. Right middle lobe 5.5 x 3.3 mm noncalcified nodule.  Remote hx of social tobacco use.  Arrange repeat CT of Chest w/o contrast in June of 2016.

## 2014-07-25 NOTE — Assessment & Plan Note (Signed)
BP spread clearly elevated today. He usually takes his medications regularly. Medication compliance encouraged.  Monitor BP at home. BP: 140/90 mmHg

## 2014-07-25 NOTE — Progress Notes (Signed)
Pre visit review using our clinic review tool, if applicable. No additional management support is needed unless otherwise documented below in the visit note. 

## 2014-07-28 ENCOUNTER — Telehealth: Payer: Self-pay | Admitting: Internal Medicine

## 2014-07-28 DIAGNOSIS — R911 Solitary pulmonary nodule: Secondary | ICD-10-CM

## 2014-07-28 NOTE — Telephone Encounter (Signed)
Please change to CT of chest w/o contrast for follow up of pulmonary nodule

## 2014-07-28 NOTE — Telephone Encounter (Signed)
Per sara at [facility] Pulmonary CT screening low dose  . Pt does not meet the requirements  for the low dose screening CT  , she called the pt awaiting pt to return her call .

## 2014-08-01 NOTE — Telephone Encounter (Signed)
Order placed for CT

## 2014-08-27 ENCOUNTER — Ambulatory Visit
Admission: RE | Admit: 2014-08-27 | Discharge: 2014-08-27 | Disposition: A | Discharge status: Home / Self Care | Payer: 59 | Source: Ambulatory Visit | Attending: Internal Medicine | Admitting: Internal Medicine

## 2014-08-27 DIAGNOSIS — M87051 Idiopathic aseptic necrosis of right femur: Secondary | ICD-10-CM

## 2014-09-10 ENCOUNTER — Telehealth: Payer: Self-pay | Admitting: Internal Medicine

## 2014-09-10 NOTE — Telephone Encounter (Signed)
Pt called to ask if Dr Shawna Orleans will rx the following med for him   SILVEDAFIL . Pt said it is rx to him through his urologist.

## 2014-09-11 MED ORDER — SILDENAFIL CITRATE 100 MG PO TABS: 100.0000 mg | ORAL_TABLET | Freq: Every day | ORAL | Status: DC | PRN

## 2014-09-11 NOTE — Telephone Encounter (Signed)
Ok to RF x 5

## 2014-09-11 NOTE — Telephone Encounter (Signed)
Dr. Shawna Orleans, medication is Sildenafil and is for ED. Please advise if can fill for pt.

## 2014-09-11 NOTE — Telephone Encounter (Signed)
Tried to contact pt, mailbox is full unable to leave message will try again later. 

## 2014-09-11 NOTE — Telephone Encounter (Signed)
Pt notified Rx sent to pharmacy

## 2014-09-11 NOTE — Telephone Encounter (Signed)
Pt called back saw missed call. Asked pt if it is Sildenafil and if it is for ED. Pt said yes, that is correct and it is for ED. Told pt okay I will let Dr.Yoo know and get back to you whether I can send Rx or not. Pt verbalized understanding.

## 2014-09-11 NOTE — Telephone Encounter (Signed)
Please clarify.  Medication is sildenafil?  Is urologist using for ED?

## 2014-09-11 NOTE — Telephone Encounter (Signed)
Dr. Shawna Orleans, will you refill Silvedafil for pt?

## 2014-09-25 ENCOUNTER — Telehealth: Payer: Self-pay | Admitting: Internal Medicine

## 2014-09-25 ENCOUNTER — Ambulatory Visit (INDEPENDENT_AMBULATORY_CARE_PROVIDER_SITE_OTHER)
Admission: RE | Admit: 2014-09-25 | Discharge: 2014-09-25 | Disposition: A | Discharge status: Home / Self Care | Payer: 59 | Source: Ambulatory Visit | Attending: Internal Medicine | Admitting: Internal Medicine

## 2014-09-25 ENCOUNTER — Other Ambulatory Visit (INDEPENDENT_AMBULATORY_CARE_PROVIDER_SITE_OTHER): Payer: 59

## 2014-09-25 DIAGNOSIS — Z0000 Encounter for general adult medical examination without abnormal findings: Secondary | ICD-10-CM | POA: Diagnosis not present

## 2014-09-25 DIAGNOSIS — R911 Solitary pulmonary nodule: Secondary | ICD-10-CM

## 2014-09-25 DIAGNOSIS — R7989 Other specified abnormal findings of blood chemistry: Secondary | ICD-10-CM

## 2014-09-25 LAB — TSH: TSH: 2.14 u[IU]/mL (ref 0.35–4.50)

## 2014-09-25 LAB — POCT URINALYSIS DIPSTICK
Bilirubin, UA: NEGATIVE
Blood, UA: NEGATIVE
Glucose, UA: NEGATIVE
KETONES UA: NEGATIVE
Leukocytes, UA: NEGATIVE
Nitrite, UA: NEGATIVE
Protein, UA: NEGATIVE
Spec Grav, UA: 1.025
Urobilinogen, UA: 0.2
pH, UA: 5.5

## 2014-09-25 LAB — MICROALBUMIN / CREATININE URINE RATIO
Creatinine,U: 217 mg/dL
MICROALB/CREAT RATIO: 0.6 mg/g (ref 0.0–30.0)
Microalb, Ur: 1.4 mg/dL (ref 0.0–1.9)

## 2014-09-25 LAB — LDL CHOLESTEROL, DIRECT: Direct LDL: 80 mg/dL

## 2014-09-25 LAB — CBC WITH DIFFERENTIAL/PLATELET
Basophils Absolute: 0 10*3/uL (ref 0.0–0.1)
Basophils Relative: 0.3 % (ref 0.0–3.0)
Eosinophils Absolute: 0.1 10*3/uL (ref 0.0–0.7)
Eosinophils Relative: 1.4 % (ref 0.0–5.0)
HCT: 43.1 % (ref 39.0–52.0)
Hemoglobin: 14.5 g/dL (ref 13.0–17.0)
LYMPHS ABS: 3.3 10*3/uL (ref 0.7–4.0)
LYMPHS PCT: 42 % (ref 12.0–46.0)
MCHC: 33.5 g/dL (ref 30.0–36.0)
MCV: 89.1 fl (ref 78.0–100.0)
Monocytes Absolute: 0.5 10*3/uL (ref 0.1–1.0)
Monocytes Relative: 6.2 % (ref 3.0–12.0)
NEUTROS ABS: 4 10*3/uL (ref 1.4–7.7)
Neutrophils Relative %: 50.1 % (ref 43.0–77.0)
PLATELETS: 282 10*3/uL (ref 150.0–400.0)
RBC: 4.84 Mil/uL (ref 4.22–5.81)
RDW: 13.9 % (ref 11.5–15.5)
WBC: 8 10*3/uL (ref 4.0–10.5)

## 2014-09-25 LAB — BASIC METABOLIC PANEL
BUN: 12 mg/dL (ref 6–23)
CHLORIDE: 105 meq/L (ref 96–112)
CO2: 26 mEq/L (ref 19–32)
CREATININE: 0.98 mg/dL (ref 0.40–1.50)
Calcium: 9.6 mg/dL (ref 8.4–10.5)
GFR: 89.14 mL/min (ref >60.00)
Glucose, Bld: 124 mg/dL — ABNORMAL HIGH (ref 70–99)
Potassium: 4.4 mEq/L (ref 3.5–5.1)
Sodium: 139 mEq/L (ref 135–145)

## 2014-09-25 LAB — HEPATIC FUNCTION PANEL
ALT: 46 U/L (ref 0–53)
AST: 27 U/L (ref 0–37)
Albumin: 4.2 g/dL (ref 3.5–5.2)
Alkaline Phosphatase: 65 U/L (ref 39–117)
BILIRUBIN TOTAL: 0.7 mg/dL (ref 0.2–1.2)
Bilirubin, Direct: 0.2 mg/dL (ref 0.0–0.3)
Total Protein: 6.8 g/dL (ref 6.0–8.3)

## 2014-09-25 LAB — LIPID PANEL
CHOL/HDL RATIO: 5
Cholesterol: 163 mg/dL (ref 0–200)
HDL: 30.5 mg/dL — ABNORMAL LOW (ref >39.00)
NonHDL: 132.5
Triglycerides: 263 mg/dL — ABNORMAL HIGH (ref 0.0–149.0)
VLDL: 52.6 mg/dL — AB (ref 0.0–40.0)

## 2014-09-25 LAB — HEMOGLOBIN A1C: Hgb A1c MFr Bld: 6.7 % — ABNORMAL HIGH (ref 4.6–6.5)

## 2014-09-25 NOTE — Telephone Encounter (Signed)
Pt called to say that he would like for the following rx sildenafil (VIAGRA) 100 MG tablet to be 90 pills .25 mg . He said this is what he had with the previous doctor.  Pt said he is not worried about what the insurance is paying . He can get it cheaper at [facility]   Pharmacy ; [facility]

## 2014-09-25 NOTE — Telephone Encounter (Signed)
Pt following up on results and would like to know if another md could read and get back w/ him today.

## 2014-09-25 NOTE — Telephone Encounter (Signed)
Patient would like to know results to his scan.

## 2014-09-26 NOTE — Telephone Encounter (Signed)
CT of chest shows stable 82mm pulm nodule right middle lobe and stable 2-3 mm of left lower lobe.  Repeat CT of chest recommended in 18 months.  Please schedule

## 2014-09-26 NOTE — Telephone Encounter (Signed)
Patient is aware of the CT result and would like to move the CT scan to 1 year.  Okay to order?

## 2014-09-29 ENCOUNTER — Encounter: Payer: Self-pay | Admitting: Family Medicine

## 2014-09-29 ENCOUNTER — Ambulatory Visit: Payer: 59 | Admitting: Internal Medicine

## 2014-09-29 ENCOUNTER — Ambulatory Visit (INDEPENDENT_AMBULATORY_CARE_PROVIDER_SITE_OTHER): Payer: 59 | Admitting: Family Medicine

## 2014-09-29 VITALS — BP 124/82 | HR 94 | Temp 98.0°F | Ht 73.0 in | Wt 285.4 lb

## 2014-09-29 DIAGNOSIS — E785 Hyperlipidemia, unspecified: Secondary | ICD-10-CM | POA: Diagnosis not present

## 2014-09-29 DIAGNOSIS — E119 Type 2 diabetes mellitus without complications: Secondary | ICD-10-CM

## 2014-09-29 DIAGNOSIS — Z6837 Body mass index (BMI) 37.0-37.9, adult: Secondary | ICD-10-CM

## 2014-09-29 DIAGNOSIS — Z23 Encounter for immunization: Secondary | ICD-10-CM

## 2014-09-29 DIAGNOSIS — I10 Essential (primary) hypertension: Secondary | ICD-10-CM

## 2014-09-29 NOTE — Patient Instructions (Signed)
BEFORE YOU LEAVE: -Tdap -schedule follow up in 3 months  Schedule a yearly opthalmology diabetic eye exam  We recommend the following healthy lifestyle measures: - eat a healthy diet consisting of lots of vegetables, fruits, beans, nuts, seeds, healthy meats such as white chicken and fish  - avoid starches, sweets, fried foods, fast food, processed foods, sodas, red meet and other fattening foods.  - get a least 150 minutes of aerobic exercise per week.

## 2014-09-29 NOTE — Addendum Note (Signed)
Addended by: Agnes Lawrence on: 09/29/2014 03:58 PM   Modules accepted: Orders

## 2014-09-29 NOTE — Progress Notes (Signed)
HPI:  Michael Jacobs is a 44 yo patient of Dr. Shawna Orleans here for a follow up for:  HTN: -elevated last visit with PCP -meds: norvasc 5mg , losartan 100mg  -reports: stable -denies: CP, SOB, DOE  DM/HLD: -meds:  Pravastatin 40, metformin 500 tid?, arb, fenofibrate 54 -labs just done LDL 80, Hgba1c 6.7 -reports: poor diet - tough watching the starches; working on increasing exercise -denies: polyuria, polydipsia, vision changes -eye exam: has not done -foot exam: needs today  ROS: See pertinent positives and negatives per HPI.  Past Medical History  Diagnosis Date  . GERD (gastroesophageal reflux disease)   . Hypertension   . Hypercholesterolemia   . Diabetes mellitus   . Anxiety   . Asthma   . Hiatal hernia   . Cecal ulcer     Past Surgical History  Procedure Laterality Date  . Tonsillectomy and adenoidectomy    . Wisdom tooth extraction    . Upper gastrointestinal endoscopy      Family History  Problem Relation Age of Onset  . Colon polyps Mother   . Colon cancer Maternal Grandmother   . Diabetes Maternal Grandmother   . Heart disease Maternal Grandmother   . Colon polyps Maternal Grandmother   . Breast cancer Paternal Grandmother   . Stomach cancer Paternal Grandmother     mets to stomach  . Prostate cancer Maternal Grandfather   . Esophageal cancer Neg Hx   . Rectal cancer Neg Hx   . Lung cancer Paternal Grandmother     History   Social History  . Marital Status: Married    Spouse Name: N/A  . Number of Children: 2  . Years of Education: N/A   Occupational History  .     Social History Main Topics  . Smoking status: Former Smoker    Types: Cigarettes, Cigars  . Smokeless tobacco: Never Used  . Alcohol Use: 0.0 oz/week    0 Standard drinks or equivalent per week     Comment: occ  . Drug Use: No  . Sexual Activity: Not on file   Other Topics Concern  . None   Social History Narrative     Current outpatient prescriptions:  .  albuterol  (PROVENTIL HFA;VENTOLIN HFA) 108 (90 BASE) MCG/ACT inhaler, Inhale 2 puffs into the lungs every 6 (six) hours as needed., Disp: 8.5 g, Rfl: 5 .  ALPRAZolam (XANAX) 0.25 MG tablet, Take 1 tablet (0.25 mg total) by mouth daily as needed for sleep or anxiety., Disp: 30 tablet, Rfl: 2 .  amLODipine (NORVASC) 5 MG tablet, TAKE 1 TABLET (5 MG TOTAL) BY MOUTH DAILY., Disp: 90 tablet, Rfl: 1 .  fenofibrate 54 MG tablet, Take 1 tablet (54 mg total) by mouth daily., Disp: 90 tablet, Rfl: 1 .  glycopyrrolate (ROBINUL) 2 MG tablet, Take 1 tablet (2 mg total) by mouth 2 (two) times daily., Disp: 180 tablet, Rfl: 3 .  losartan (COZAAR) 100 MG tablet, TAKE 1 TABLET (100 MG TOTAL) BY MOUTH DAILY., Disp: 90 tablet, Rfl: 1 .  metFORMIN (GLUCOPHAGE) 500 MG tablet, Take 1 tablet (500 mg total) by mouth 3 (three) times daily with meals., Disp: 270 tablet, Rfl: 1 .  pantoprazole (PROTONIX) 40 MG tablet, Take 1 tablet (40 mg total) by mouth 2 (two) times daily., Disp: 60 tablet, Rfl: 4 .  pravastatin (PRAVACHOL) 40 MG tablet, TAKE 1 TABLET (40 MG TOTAL) BY MOUTH DAILY., Disp: 90 tablet, Rfl: 1 .  sildenafil (VIAGRA) 100 MG tablet, Take 1 tablet (  100 mg total) by mouth daily as needed for erectile dysfunction., Disp: 10 tablet, Rfl: 5  EXAM:  Filed Vitals:   09/29/14 1517  BP: 124/82  Pulse: 94  Temp: 98 F (36.7 C)    Body mass index is 37.66 kg/(m^2).  GENERAL: vitals reviewed and listed above, alert, oriented, appears well hydrated and in no acute distress  HEENT: atraumatic, conjunttiva clear, no obvious abnormalities on inspection of external nose and ears  NECK: no obvious masses on inspection  LUNGS: clear to auscultation bilaterally, no wheezes, rales or rhonchi, good air movement  CV: HRRR, no peripheral edema  MS: moves all extremities without noticeable abnormality  PSYCH: pleasant and cooperative, no obvious depression or anxiety  Foot exam done  ASSESSMENT AND PLAN:  Discussed the  following assessment and plan:  Essential hypertension  Hyperlipidemia  Type 2 diabetes mellitus without complication  BMI 17.6-16.0, adult  -lifestyle recs -foot exam done -advised yearly diabetic eye exam -tetanus booster offered -follow up in 3 month -Patient advised to return or notify a doctor immediately if symptoms worsen or persist or new concerns arise.  There are no Patient Instructions on file for this visit.   Colin Benton R.

## 2014-09-29 NOTE — Progress Notes (Signed)
Pre visit review using our clinic review tool, if applicable. No additional management support is needed unless otherwise documented below in the visit note. 

## 2014-09-30 ENCOUNTER — Inpatient Hospital Stay: Admission: RE | Admit: 2014-09-30 | Payer: 59 | Source: Ambulatory Visit

## 2014-09-30 NOTE — Telephone Encounter (Signed)
We should follow recommendation from radiologist.  Not sure he will have insurance coverage issues.

## 2014-09-30 NOTE — Telephone Encounter (Signed)
Left message for pt to call back  °

## 2014-10-01 NOTE — Telephone Encounter (Signed)
Pt aware, he wants to do a year follow up on the CT.  He also wanted to know if Dr Shawna Orleans would give him viagra 20 mg for once daily use.  I told pt I did not think you could take viagra daily and asked him if he was referring to cialis 5 mg.  He stated no, it was viagra.  Please advise if okay to take the viagra 20mg  once daily

## 2014-10-02 NOTE — Telephone Encounter (Signed)
Ok to put order for follow up CT of chest w/o contrast in 1 year.  Hopefully, ins will cover.   I have not prescribed sildenafil 20 mg daily.  Was his urologist giving him that dose daily.   Sidenafil also used for pulmonary hypertension for tid use but pt does not have pulm htn.

## 2014-10-03 ENCOUNTER — Telehealth: Payer: Self-pay | Admitting: Internal Medicine

## 2014-10-03 NOTE — Telephone Encounter (Signed)
Left message on machine for patient to return our call 

## 2014-10-03 NOTE — Telephone Encounter (Signed)
Pt returned your call.  

## 2014-10-06 MED ORDER — SILDENAFIL CITRATE 100 MG PO TABS: 100.0000 mg | ORAL_TABLET | Freq: Every day | ORAL | Status: DC | PRN

## 2014-10-06 NOTE — Telephone Encounter (Signed)
Patient is aware of order placed.  Patient will call urology for a sildenafil prescription.

## 2014-10-06 NOTE — Telephone Encounter (Signed)
Ok for sildenafil 100 mg #90  Take one tab daily as needed RF x1  RY

## 2014-10-06 NOTE — Telephone Encounter (Signed)
rx sent in electronically 

## 2014-10-29 ENCOUNTER — Telehealth: Payer: Self-pay | Admitting: Gastroenterology

## 2014-10-29 ENCOUNTER — Ambulatory Visit (INDEPENDENT_AMBULATORY_CARE_PROVIDER_SITE_OTHER): Payer: 59 | Admitting: Adult Health

## 2014-10-29 ENCOUNTER — Encounter: Payer: Self-pay | Admitting: Adult Health

## 2014-10-29 VITALS — BP 140/100 | HR 116 | Temp 99.4°F | Resp 18 | Wt 279.6 lb

## 2014-10-29 DIAGNOSIS — R1032 Left lower quadrant pain: Secondary | ICD-10-CM

## 2014-10-29 NOTE — Telephone Encounter (Signed)
Patient reports that he is having constipation.  He reports one dose of Miralax yesterday morning and "5 coloclense" at 11:00 today.  He is very worried that he has not had a BM yet and is concerned he has diverticulitis and his colon may rupture like his grandfathers.  He asks if he should try an enema.  He is advised that he should increase the fluid he is drinking, I explained that Miralax and fiber work with volume to help liquify the stools.  He has an appt at 3:30 today at another practice.  He asks if he should cancel with them and come here.  I advised that I do not have any providers that could see him today and encouraged to keep his appt for today

## 2014-10-29 NOTE — Patient Instructions (Signed)
It was great meeting you today!  Please pick up a bottle of Magnesium Citrate oral solution. Mix a bottle with sprite. If that does not work, then please go to urgent care or the ER.   Let me know if there is anything I can do for you!

## 2014-10-29 NOTE — Progress Notes (Signed)
Subjective:    Patient ID: Michael Jacobs, male    DOB: May 11, 1970, 44 y.o.   MRN: [id number]  HPI  44 year old relatively healthy male who presents to the office today for two days of lower mid/left abdominal pain " feels like I am constipated and I am sore." He also endorses cramping pain. It feels like he needs to have a BM but when he tries to go he is unable too. Last BM was yesterday morning ( small). He has tried metamucil  last night which did not do anything, this usually works for him. He does appear to have issues with constipation.   Mild fever 99.31F all day today with occasional nausea. Denies vomiting.   Has not taken any of his medications. BP and pulse are elevated in office today due to this  Family history of Diverticulitis.   His last colonoscopy in Decemember 2015 showed Mild diverticulosis was in the sigmoid colon and Single ulcer at the ileocecal valve.  Review of Systems  Unable to perform ROS Constitutional: Positive for fever. Negative for chills and fatigue.  Respiratory: Negative.   Cardiovascular: Negative.   Gastrointestinal: Positive for nausea, abdominal pain and constipation. Negative for vomiting, diarrhea, blood in stool, abdominal distention, anal bleeding and rectal pain.  Genitourinary: Negative for flank pain, decreased urine volume and difficulty urinating.  Musculoskeletal: Negative.   Skin: Negative.   Neurological: Negative.   All other systems reviewed and are negative.  Past Medical History  Diagnosis Date  . GERD (gastroesophageal reflux disease)   . Hypertension   . Hypercholesterolemia   . Diabetes mellitus   . Anxiety   . Asthma   . Hiatal hernia   . Cecal ulcer     History   Social History  . Marital Status: Married    Spouse Name: N/A  . Number of Children: 2  . Years of Education: N/A   Occupational History  .     Social History Main Topics  . Smoking status: Former Smoker    Types: Cigarettes, Cigars  .  Smokeless tobacco: Never Used  . Alcohol Use: 0.0 oz/week    0 Standard drinks or equivalent per week     Comment: occ  . Drug Use: No  . Sexual Activity: Not on file   Other Topics Concern  . Not on file   Social History Narrative    Past Surgical History  Procedure Laterality Date  . Tonsillectomy and adenoidectomy    . Wisdom tooth extraction    . Upper gastrointestinal endoscopy      Family History  Problem Relation Age of Onset  . Colon polyps Mother   . Colon cancer Maternal Grandmother   . Diabetes Maternal Grandmother   . Heart disease Maternal Grandmother   . Colon polyps Maternal Grandmother   . Breast cancer Paternal Grandmother   . Stomach cancer Paternal Grandmother     mets to stomach  . Prostate cancer Maternal Grandfather   . Esophageal cancer Neg Hx   . Rectal cancer Neg Hx   . Lung cancer Paternal Grandmother     Allergies  Allergen Reactions  . Jacobs Inhibitors Cough  . Phenergan [Promethazine Hcl] Anxiety    Current Outpatient Prescriptions on File Prior to Visit  Medication Sig Dispense Refill  . albuterol (PROVENTIL HFA;VENTOLIN HFA) 108 (90 BASE) MCG/ACT inhaler Inhale 2 puffs into the lungs every 6 (six) hours as needed. 8.5 g 5  . ALPRAZolam (XANAX) 0.25 MG  tablet Take 1 tablet (0.25 mg total) by mouth daily as needed for sleep or anxiety. 30 tablet 2  . amLODipine (NORVASC) 5 MG tablet TAKE 1 TABLET (5 MG TOTAL) BY MOUTH DAILY. 90 tablet 1  . fenofibrate 54 MG tablet Take 1 tablet (54 mg total) by mouth daily. 90 tablet 1  . glycopyrrolate (ROBINUL) 2 MG tablet Take 1 tablet (2 mg total) by mouth 2 (two) times daily. 180 tablet 3  . losartan (COZAAR) 100 MG tablet TAKE 1 TABLET (100 MG TOTAL) BY MOUTH DAILY. 90 tablet 1  . metFORMIN (GLUCOPHAGE) 500 MG tablet Take 1 tablet (500 mg total) by mouth 3 (three) times daily with meals. 270 tablet 1  . pantoprazole (PROTONIX) 40 MG tablet Take 1 tablet (40 mg total) by mouth 2 (two) times daily. 60  tablet 4  . pravastatin (PRAVACHOL) 40 MG tablet TAKE 1 TABLET (40 MG TOTAL) BY MOUTH DAILY. 90 tablet 1  . sildenafil (VIAGRA) 100 MG tablet Take 1 tablet (100 mg total) by mouth daily as needed for erectile dysfunction. 90 tablet 1   No current facility-administered medications on file prior to visit.    BP 140/100 mmHg  Pulse 116  Temp(Src) 99.4 F (37.4 C) (Oral)  Resp 18  Wt 279 lb 9.6 oz (126.826 kg)  SpO2 96%       Objective:   Physical Exam  Constitutional: He is oriented to person, place, and time. He appears well-developed and well-nourished. No distress.  Cardiovascular: Normal rate, regular rhythm, normal heart sounds and intact distal pulses.  Exam reveals no gallop and no friction rub.   No murmur heard. Pulmonary/Chest: Effort normal and breath sounds normal. No respiratory distress. He has no wheezes. He has no rales. He exhibits no tenderness.  Abdominal: Soft. He exhibits no distension and no mass. There is tenderness. There is no rebound and no guarding.  Hypoactive bowel sounds in all four quadrants Pain with palpation to left lower and mid abdomen. No pain in right upper or lower.   Genitourinary: Rectum normal.  No stool in vault  Musculoskeletal: Normal range of motion. He exhibits no edema or tenderness.  Neurological: He is alert and oriented to person, place, and time.  Skin: Skin is warm and dry. No rash noted. He is not diaphoretic. No erythema. No pallor.  Psychiatric: He has a normal mood and affect. His behavior is normal. Judgment and thought content normal.  Nursing note and vitals reviewed.      Assessment & Plan:  1. Abdominal pain, left lower quadrant - Likely constipation but can not r/o diverticulitis. Less likely would be kidney stone - Advised that I would recommend going to the ER for imaging and labs.  - Patient would rather try something OTC before going to the ER.  - OTC Magnesium Citrate  - If no improvement in 1-2 hours after  taking Magnesium Citrate then please go to UC or ER.

## 2014-10-30 ENCOUNTER — Other Ambulatory Visit: Payer: Self-pay | Admitting: Gastroenterology

## 2014-12-22 LAB — HM DIABETES EYE EXAM

## 2014-12-30 ENCOUNTER — Ambulatory Visit: Payer: 59 | Admitting: Family Medicine

## 2015-01-01 ENCOUNTER — Encounter: Payer: Self-pay | Admitting: Internal Medicine

## 2015-01-01 ENCOUNTER — Other Ambulatory Visit: Payer: Self-pay | Admitting: Internal Medicine

## 2015-01-01 ENCOUNTER — Other Ambulatory Visit: Payer: Self-pay | Admitting: Gastroenterology

## 2015-01-20 ENCOUNTER — Ambulatory Visit: Payer: Self-pay | Admitting: Family Medicine

## 2015-01-20 NOTE — Progress Notes (Signed)
NO SHOW

## 2015-02-04 ENCOUNTER — Ambulatory Visit: Payer: 59 | Admitting: Family Medicine

## 2015-02-04 NOTE — Progress Notes (Signed)
No show

## 2015-02-18 ENCOUNTER — Ambulatory Visit: Payer: Self-pay | Admitting: Family Medicine

## 2015-02-18 NOTE — Progress Notes (Signed)
NO SHOW

## 2015-02-19 ENCOUNTER — Ambulatory Visit (INDEPENDENT_AMBULATORY_CARE_PROVIDER_SITE_OTHER): Payer: 59 | Admitting: Family Medicine

## 2015-02-19 ENCOUNTER — Encounter: Payer: Self-pay | Admitting: Family Medicine

## 2015-02-19 VITALS — BP 122/82 | HR 84 | Temp 97.9°F | Ht 73.0 in | Wt 288.5 lb

## 2015-02-19 DIAGNOSIS — I10 Essential (primary) hypertension: Secondary | ICD-10-CM

## 2015-02-19 DIAGNOSIS — J309 Allergic rhinitis, unspecified: Secondary | ICD-10-CM

## 2015-02-19 DIAGNOSIS — E119 Type 2 diabetes mellitus without complications: Secondary | ICD-10-CM | POA: Diagnosis not present

## 2015-02-19 DIAGNOSIS — Z23 Encounter for immunization: Secondary | ICD-10-CM | POA: Diagnosis not present

## 2015-02-19 DIAGNOSIS — G44209 Tension-type headache, unspecified, not intractable: Secondary | ICD-10-CM

## 2015-02-19 DIAGNOSIS — E785 Hyperlipidemia, unspecified: Secondary | ICD-10-CM

## 2015-02-19 NOTE — Progress Notes (Signed)
HPI:  Michael Jacobs is a pleasant 44 yo patient of Dr. Shawna Orleans, here for follow up. He was unable to make his last 3 appointments due to his work schedule. Reports is doing well. Reports doing some weight lifting in the gym and has improved diet. Denies CP, SOB, DOE, polyuria or dipsia. Reports he does not take robinol and wants me to remove from his medication list. Reports increased computer work recently and some neck strain and tension type headaches he thinks may be related to this. Also has had some nasal congestion, PND, sneezing, has this constantly, but a little worse for the last month. Denies fevers, vision changes, chills, sinus pain, persistent HAs.  ROS: See pertinent positives and negatives per HPI.  Past Medical History  Diagnosis Date  . GERD (gastroesophageal reflux disease)   . Hypertension   . Hypercholesterolemia   . Diabetes mellitus ([place])   . Anxiety   . Asthma   . Hiatal hernia   . Cecal ulcer     Past Surgical History  Procedure Laterality Date  . Tonsillectomy and adenoidectomy    . Wisdom tooth extraction    . Upper gastrointestinal endoscopy      Family History  Problem Relation Age of Onset  . Colon polyps Mother   . Colon cancer Maternal Grandmother   . Diabetes Maternal Grandmother   . Heart disease Maternal Grandmother   . Colon polyps Maternal Grandmother   . Breast cancer Paternal Grandmother   . Stomach cancer Paternal Grandmother     mets to stomach  . Prostate cancer Maternal Grandfather   . Esophageal cancer Neg Hx   . Rectal cancer Neg Hx   . Lung cancer Paternal Grandmother     Social History   Social History  . Marital Status: Married    Spouse Name: N/A  . Number of Children: 2  . Years of Education: N/A   Occupational History  .     Social History Main Topics  . Smoking status: Former Smoker    Types: Cigarettes, Cigars  . Smokeless tobacco: Never Used  . Alcohol Use: 0.0 oz/week    0 Standard drinks or equivalent  per week     Comment: occ  . Drug Use: No  . Sexual Activity: Not Asked   Other Topics Concern  . None   Social History Narrative     Current outpatient prescriptions:  .  albuterol (PROVENTIL HFA;VENTOLIN HFA) 108 (90 BASE) MCG/ACT inhaler, Inhale 2 puffs into the lungs every 6 (six) hours as needed., Disp: 8.5 g, Rfl: 5 .  ALPRAZolam (XANAX) 0.25 MG tablet, Take 1 tablet (0.25 mg total) by mouth daily as needed for sleep or anxiety., Disp: 30 tablet, Rfl: 2 .  amLODipine (NORVASC) 5 MG tablet, TAKE 1 TABLET (5 MG TOTAL) BY MOUTH DAILY., Disp: 90 tablet, Rfl: 0 .  fenofibrate 54 MG tablet, Take 1 tablet (54 mg total) by mouth daily., Disp: 90 tablet, Rfl: 1 .  losartan (COZAAR) 100 MG tablet, TAKE 1 TABLET (100 MG TOTAL) BY MOUTH DAILY., Disp: 90 tablet, Rfl: 0 .  metFORMIN (GLUCOPHAGE) 500 MG tablet, Take 1 tablet (500 mg total) by mouth 3 (three) times daily with meals., Disp: 270 tablet, Rfl: 1 .  pantoprazole (PROTONIX) 40 MG tablet, TAKE 1 TABLET (40 MG TOTAL) BY MOUTH 2 (TWO) TIMES DAILY., Disp: 60 tablet, Rfl: 0 .  pravastatin (PRAVACHOL) 40 MG tablet, TAKE 1 TABLET (40 MG TOTAL) BY MOUTH DAILY., Disp: 90  tablet, Rfl: 1 .  sildenafil (VIAGRA) 100 MG tablet, Take 1 tablet (100 mg total) by mouth daily as needed for erectile dysfunction., Disp: 90 tablet, Rfl: 1  EXAM:  Filed Vitals:   02/19/15 0931  BP: 122/82  Pulse: 84  Temp: 97.9 F (36.6 C)    Body mass index is 38.07 kg/(m^2).  GENERAL: vitals reviewed and listed above, alert, oriented, appears well hydrated and in no acute distress  HEENT: atraumatic, PERRLA, conjunttiva clear, no obvious abnormalities on inspection of external nose and ears, normal appearance of ear canals and TMs except for R ear clear effusion and scarring of both TMs, clear nasal congestion, boggy turbinates, mild post oropharyngeal erythema with PND, no tonsillar edema or exudate, no sinus TTP  NECK: no obvious masses on inspection, no neck  stiffness  LUNGS: clear to auscultation bilaterally, no wheezes, rales or rhonchi, good air movement  CV: HRRR, no peripheral edema  MS: moves all extremities without noticeable abnormality  PSYCH: pleasant and cooperative, no obvious depression or anxiety  NEURO, PERRLA, CN 2-XII grossly intact  ASSESSMENT AND PLAN:  Discussed the following assessment and plan:  Hyperlipidemia  Essential hypertension  Type 2 diabetes mellitus without complication, without long-term current use of insulin (HCC)  Allergic rhinitis, unspecified allergic rhinitis type  Tension headache  -lifestyle counseling -flu shot -updated med list -tx fo AR with INS and antihistamine -neck strain exercises - advised of other causes of HAs and advised follow up if any symptoms persist or worsen in 1 month -reviewed no show policy given 3 no shows in a row -labs at follow up with PCP in 3 months -Patient advised to return or notify a doctor immediately if symptoms worsen or persist or new concerns arise.  Patient Instructions  BEFORE YOU LEAVE: -neck strain exercises -flu shot -schedule follow up with Dr. Shawna Orleans in 3 months  Flonase 2 sprays each nostril daily for 1 month  Claritin or allegra once daily  Follow up sooner if persistent symptoms  We recommend the following healthy lifestyle measures: - eat a healthy whole foods diet consisting of regular small meals composed of vegetables, fruits, beans, nuts, seeds, healthy meats such as white chicken and fish and whole grains.  - avoid sweets, white starchy foods, fried foods, fast food, processed foods, sodas, red meet and other fattening foods.  - get a least 150-300 minutes of aerobic exercise per week.       Colin Benton R.

## 2015-02-19 NOTE — Patient Instructions (Signed)
BEFORE YOU LEAVE: -neck strain exercises -flu shot -schedule follow up with Dr. Shawna Orleans in 3 months  Flonase 2 sprays each nostril daily for 1 month  Claritin or allegra once daily  Follow up sooner if persistent symptoms  We recommend the following healthy lifestyle measures: - eat a healthy whole foods diet consisting of regular small meals composed of vegetables, fruits, beans, nuts, seeds, healthy meats such as white chicken and fish and whole grains.  - avoid sweets, white starchy foods, fried foods, fast food, processed foods, sodas, red meet and other fattening foods.  - get a least 150-300 minutes of aerobic exercise per week.

## 2015-02-19 NOTE — Progress Notes (Signed)
Pre visit review using our clinic review tool, if applicable. No additional management support is needed unless otherwise documented below in the visit note. 

## 2015-03-08 ENCOUNTER — Other Ambulatory Visit: Payer: Self-pay | Admitting: Gastroenterology

## 2015-03-09 ENCOUNTER — Telehealth: Payer: Self-pay | Admitting: Internal Medicine

## 2015-03-09 MED ORDER — PANTOPRAZOLE SODIUM 40 MG PO TBEC: 40.0000 mg | DELAYED_RELEASE_TABLET | Freq: Two times a day (BID) | ORAL | Status: DC

## 2015-03-09 NOTE — Telephone Encounter (Signed)
Pt needs refill on Pantoprazole 40mg  called into [facility]

## 2015-03-15 ENCOUNTER — Other Ambulatory Visit: Payer: Self-pay | Admitting: Internal Medicine

## 2015-04-12 DIAGNOSIS — E119 Type 2 diabetes mellitus without complications: Secondary | ICD-10-CM

## 2015-04-12 HISTORY — DX: Type 2 diabetes mellitus without complications: E11.9

## 2015-05-21 ENCOUNTER — Telehealth: Payer: Self-pay | Admitting: Internal Medicine

## 2015-05-21 NOTE — Telephone Encounter (Signed)
Pt had appointment Friday with Dr Shawna Orleans, but of course it has to be rescheduled. Pt rescheduled 06/19/15 but states he will need refills on the following meds 90 day if possible.  We did discuss if Dr Shawna Orleans will n ot be here on 3/10, he will need to schedule followup with another provider if needs to. Pt verbalized understanding.      ALPRAZolam (XANAX) 0.25 MG tablet   amLODipine (NORVASC) 5 MG tablet   fenofibrate 54 MG tablet   losartan (COZAAR) 100 MG tablet   metFORMIN (GLUCOPHAGE) 500 MG tablet   pantoprazole (PROTONIX) 40 MG tablet   pravastatin (PRAVACHOL) 40 MG tablet        [facility]

## 2015-05-22 ENCOUNTER — Ambulatory Visit: Payer: 59 | Admitting: Internal Medicine

## 2015-05-22 MED ORDER — PRAVASTATIN SODIUM 40 MG PO TABS: ORAL_TABLET | ORAL | Status: DC

## 2015-05-22 MED ORDER — METFORMIN HCL 500 MG PO TABS: 500.0000 mg | ORAL_TABLET | Freq: Three times a day (TID) | ORAL | Status: DC

## 2015-05-22 MED ORDER — ALPRAZOLAM 0.25 MG PO TABS: 0.2500 mg | ORAL_TABLET | Freq: Every day | ORAL | Status: DC | PRN

## 2015-05-22 MED ORDER — AMLODIPINE BESYLATE 5 MG PO TABS: ORAL_TABLET | ORAL | Status: DC

## 2015-05-22 MED ORDER — LOSARTAN POTASSIUM 100 MG PO TABS: ORAL_TABLET | ORAL | Status: DC

## 2015-05-22 MED ORDER — FENOFIBRATE 54 MG PO TABS: 54.0000 mg | ORAL_TABLET | Freq: Every day | ORAL | Status: DC

## 2015-05-22 MED ORDER — PANTOPRAZOLE SODIUM 40 MG PO TBEC: 40.0000 mg | DELAYED_RELEASE_TABLET | Freq: Two times a day (BID) | ORAL | Status: DC

## 2015-05-22 NOTE — Telephone Encounter (Signed)
rx sent

## 2015-06-19 ENCOUNTER — Ambulatory Visit: Payer: 59 | Admitting: Internal Medicine

## 2015-06-19 ENCOUNTER — Telehealth: Payer: Self-pay | Admitting: Internal Medicine

## 2015-06-19 DIAGNOSIS — E785 Hyperlipidemia, unspecified: Secondary | ICD-10-CM

## 2015-06-19 DIAGNOSIS — E119 Type 2 diabetes mellitus without complications: Secondary | ICD-10-CM

## 2015-06-19 DIAGNOSIS — I10 Essential (primary) hypertension: Secondary | ICD-10-CM

## 2015-06-19 NOTE — Telephone Encounter (Signed)
Pt was scheduled for today for a follow up. Since this has to be rescheduled, pt would like to have labs prior to a visit. Pt states his appointment has been cancelled several times And it has been a long time since labs were checked. Ok to schedule some lab work? and pt would like to know who Dr Shawna Orleans thinks he should see for the follow up, since he has DM.  Pt has seen Dr Maudie Mercury and Tommi Rumps and likes them both. Please advise, and thank you.

## 2015-06-19 NOTE — Telephone Encounter (Signed)
Orders placed.

## 2015-06-19 NOTE — Telephone Encounter (Signed)
Pt states he wants to think about who he wants to see, and will call me on Monday morning with decision.

## 2015-06-19 NOTE — Telephone Encounter (Signed)
I suggest CMET, FLP, A1c, microalb/cr ratio.  I suggest he see Dr. Maudie Mercury or Dr. Yong Channel.  Please apologize for me.  I am sorry he was rescheduled so many times.

## 2015-06-22 ENCOUNTER — Other Ambulatory Visit (INDEPENDENT_AMBULATORY_CARE_PROVIDER_SITE_OTHER): Payer: 59

## 2015-06-22 DIAGNOSIS — E119 Type 2 diabetes mellitus without complications: Secondary | ICD-10-CM | POA: Diagnosis not present

## 2015-06-22 DIAGNOSIS — E785 Hyperlipidemia, unspecified: Secondary | ICD-10-CM | POA: Diagnosis not present

## 2015-06-22 DIAGNOSIS — I10 Essential (primary) hypertension: Secondary | ICD-10-CM

## 2015-06-22 LAB — MICROALBUMIN / CREATININE URINE RATIO
CREATININE, U: 239.3 mg/dL
MICROALB UR: 4 mg/dL — AB (ref 0.0–1.9)
Microalb Creat Ratio: 1.7 mg/g (ref 0.0–30.0)

## 2015-06-22 LAB — COMPREHENSIVE METABOLIC PANEL
ALBUMIN: 4.3 g/dL (ref 3.5–5.2)
ALK PHOS: 66 U/L (ref 39–117)
ALT: 43 U/L (ref 0–53)
AST: 55 U/L — AB (ref 0–37)
BILIRUBIN TOTAL: 0.5 mg/dL (ref 0.2–1.2)
BUN: 13 mg/dL (ref 6–23)
CALCIUM: 9.6 mg/dL (ref 8.4–10.5)
CHLORIDE: 104 meq/L (ref 96–112)
CO2: 27 mEq/L (ref 19–32)
CREATININE: 0.89 mg/dL (ref 0.40–1.50)
GFR: 99.26 mL/min (ref >60.00)
Glucose, Bld: 164 mg/dL — ABNORMAL HIGH (ref 70–99)
Potassium: 4.5 mEq/L (ref 3.5–5.1)
Sodium: 141 mEq/L (ref 135–145)
TOTAL PROTEIN: 6.7 g/dL (ref 6.0–8.3)

## 2015-06-22 LAB — LIPID PANEL
CHOLESTEROL: 166 mg/dL (ref 0–200)
HDL: 28.8 mg/dL — ABNORMAL LOW (ref >39.00)
NonHDL: 137.01
TRIGLYCERIDES: 335 mg/dL — AB (ref 0.0–149.0)
Total CHOL/HDL Ratio: 6
VLDL: 67 mg/dL — ABNORMAL HIGH (ref 0.0–40.0)

## 2015-06-22 LAB — HEMOGLOBIN A1C: HEMOGLOBIN A1C: 7.1 % — AB (ref 4.6–6.5)

## 2015-06-22 LAB — LDL CHOLESTEROL, DIRECT: Direct LDL: 78 mg/dL

## 2015-06-22 NOTE — Telephone Encounter (Signed)
Pt made appt with Dr Maudie Mercury

## 2015-06-26 ENCOUNTER — Ambulatory Visit (INDEPENDENT_AMBULATORY_CARE_PROVIDER_SITE_OTHER): Payer: 59 | Admitting: Family Medicine

## 2015-06-26 ENCOUNTER — Encounter: Payer: Self-pay | Admitting: Family Medicine

## 2015-06-26 ENCOUNTER — Other Ambulatory Visit: Payer: 59

## 2015-06-26 VITALS — BP 110/84 | HR 86 | Temp 97.4°F | Ht 73.0 in | Wt 281.5 lb

## 2015-06-26 DIAGNOSIS — E785 Hyperlipidemia, unspecified: Secondary | ICD-10-CM | POA: Diagnosis not present

## 2015-06-26 DIAGNOSIS — I10 Essential (primary) hypertension: Secondary | ICD-10-CM

## 2015-06-26 DIAGNOSIS — E119 Type 2 diabetes mellitus without complications: Secondary | ICD-10-CM | POA: Diagnosis not present

## 2015-06-26 LAB — GLUCOSE, POCT (MANUAL RESULT ENTRY): POC GLUCOSE: 132 mg/dL — AB (ref 70–99)

## 2015-06-26 MED ORDER — METFORMIN HCL 500 MG PO TABS: 1000.0000 mg | ORAL_TABLET | Freq: Two times a day (BID) | ORAL | Status: DC

## 2015-06-26 NOTE — Patient Instructions (Signed)
Before you leave: -Wendie Simmer, please check his blood sugar to compare to his monitor, he is concerned his monitor is not working correctly -Schedule follow-up with your primary doctor in about 3-4 months; please come fasting so that lab work can be done that day  Please limit alcohol use and do not drink more than 2-3 drinks at any given day or more than 7 drinks in a week.  Please increase the metformin to 1000 mg twice daily. I did send a new prescription for 1000 mg tablets.  We recommend the following healthy lifestyle measures: - eat a healthy whole foods diet consisting of regular small meals composed of vegetables, fruits, beans, nuts, seeds, healthy meats such as white chicken and fish and whole grains.  - avoid sweets, white starchy foods, fried foods, fast food, processed foods, sodas, red meet and other fattening foods.  - get a least 150-300 minutes of aerobic exercise per week.

## 2015-06-26 NOTE — Addendum Note (Signed)
Addended by: Agnes Lawrence on: 06/26/2015 10:44 AM   Modules accepted: Orders

## 2015-06-26 NOTE — Progress Notes (Signed)
Pre visit review using our clinic review tool, if applicable. No additional management support is needed unless otherwise documented below in the visit note. 

## 2015-06-26 NOTE — Progress Notes (Signed)
HPI:  Michael Jacobs is a pleasant 45 yo here for a follow up visit to get lab results ordered by his PCP. He has a PMH GERD, HTN, HLD, DM and anxiety. Diabetes lab is mildly up, triglycerides are elevated with abnormal cholesterol ratio, mild AST elevation.  Reports drinking a significant amount of alcohol over 5-6 drinks prior to his last set of labs. He is trying to exercise more, 4-5 days a week on the elliptical for 30-40 minutes. His diet is so-so, he has been trying to improve in the area, though it sounds like portion sizes is an issue. His blood sugars at home have been good in the 118-120 range fasting, that he does admit depending on what he eats they can do little high postprandially. No vision changes, lesions, chest pain or difficulty breathing with exercise.  ROS: See pertinent positives and negatives per HPI.  Past Medical History  Diagnosis Date  . GERD (gastroesophageal reflux disease)   . Hypertension   . Hypercholesterolemia   . Diabetes mellitus ([place])   . Anxiety   . Asthma   . Hiatal hernia   . Cecal ulcer     Past Surgical History  Procedure Laterality Date  . Tonsillectomy and adenoidectomy    . Wisdom tooth extraction    . Upper gastrointestinal endoscopy      Family History  Problem Relation Age of Onset  . Colon polyps Mother   . Colon cancer Maternal Grandmother   . Diabetes Maternal Grandmother   . Heart disease Maternal Grandmother   . Colon polyps Maternal Grandmother   . Breast cancer Paternal Grandmother   . Stomach cancer Paternal Grandmother     mets to stomach  . Prostate cancer Maternal Grandfather   . Esophageal cancer Neg Hx   . Rectal cancer Neg Hx   . Lung cancer Paternal Grandmother     Social History   Social History  . Marital Status: Married    Spouse Name: N/A  . Number of Children: 2  . Years of Education: N/A   Occupational History  .     Social History Main Topics  . Smoking status: Former Smoker    Types:  Cigarettes, Cigars  . Smokeless tobacco: Never Used  . Alcohol Use: 0.0 oz/week    0 Standard drinks or equivalent per week     Comment: occ  . Drug Use: No  . Sexual Activity: Not Asked   Other Topics Concern  . None   Social History Narrative     Current outpatient prescriptions:  .  albuterol (PROVENTIL HFA;VENTOLIN HFA) 108 (90 BASE) MCG/ACT inhaler, Inhale 2 puffs into the lungs every 6 (six) hours as needed., Disp: 8.5 g, Rfl: 5 .  ALPRAZolam (XANAX) 0.25 MG tablet, Take 1 tablet (0.25 mg total) by mouth daily as needed for sleep or anxiety., Disp: 30 tablet, Rfl: 2 .  amLODipine (NORVASC) 5 MG tablet, TAKE 1 TABLET (5 MG TOTAL) BY MOUTH DAILY., Disp: 90 tablet, Rfl: 1 .  fenofibrate 54 MG tablet, Take 1 tablet (54 mg total) by mouth daily., Disp: 90 tablet, Rfl: 1 .  losartan (COZAAR) 100 MG tablet, TAKE 1 TABLET (100 MG TOTAL) BY MOUTH DAILY., Disp: 90 tablet, Rfl: 1 .  metFORMIN (GLUCOPHAGE) 500 MG tablet, Take 2 tablets (1,000 mg total) by mouth 2 (two) times daily with a meal., Disp: 280 tablet, Rfl: 1 .  pantoprazole (PROTONIX) 40 MG tablet, Take 1 tablet (40 mg total) by mouth  2 (two) times daily., Disp: 180 tablet, Rfl: 1 .  pravastatin (PRAVACHOL) 40 MG tablet, TAKE 1 TABLET (40 MG TOTAL) BY MOUTH DAILY., Disp: 90 tablet, Rfl: 1 .  sildenafil (VIAGRA) 100 MG tablet, Take 1 tablet (100 mg total) by mouth daily as needed for erectile dysfunction., Disp: 90 tablet, Rfl: 1  EXAM:  Filed Vitals:   06/26/15 0947  BP: 110/84  Pulse: 86  Temp: 97.4 F (36.3 C)    Body mass index is 37.15 kg/(m^2).  GENERAL: vitals reviewed and listed above, alert, oriented, appears well hydrated and in no acute distress  HEENT: atraumatic, conjunttiva clear, no obvious abnormalities on inspection of external nose and ears  NECK: no obvious masses on inspection  LUNGS: clear to auscultation bilaterally, no wheezes, rales or rhonchi, good air movement  CV: HRRR, no peripheral  edema  MS: moves all extremities without noticeable abnormality  PSYCH: pleasant and cooperative, no obvious depression or anxiety  ASSESSMENT AND PLAN:  Discussed the following assessment and plan:  Type 2 diabetes mellitus without complication, without long-term current use of insulin (HCC)  Essential hypertension  Hyperlipidemia  -Congratulated him on the changes he has been making lifestyle - stressed importance of abstinence/cutting back from alcohol, healthy low carb diet with healthy fats and regular exercise -advised to increase metformin to 1000mg  bid -consider change to crestor if lipids not improving -advised follow up in 3-4 months with repeat of labs then -Patient advised to return or notify a doctor immediately if symptoms worsen or persist or new concerns arise.  Patient Instructions  Before you leave: -Wendie Simmer, please check his blood sugar to compare to his monitor, he is concerned his monitor is not working correctly -Schedule follow-up with your primary doctor in about 3-4 months; please come fasting so that lab work can be done that day  Please limit alcohol use and do not drink more than 2-3 drinks at any given day or more than 7 drinks in a week.  Please increase the metformin to 1000 mg twice daily. I did send a new prescription for 1000 mg tablets.  We recommend the following healthy lifestyle measures: - eat a healthy whole foods diet consisting of regular small meals composed of vegetables, fruits, beans, nuts, seeds, healthy meats such as white chicken and fish and whole grains.  - avoid sweets, white starchy foods, fried foods, fast food, processed foods, sodas, red meet and other fattening foods.  - get a least 150-300 minutes of aerobic exercise per week.              Colin Benton R.

## 2015-09-28 ENCOUNTER — Ambulatory Visit: Payer: 59 | Admitting: Family Medicine

## 2015-10-26 ENCOUNTER — Ambulatory Visit (INDEPENDENT_AMBULATORY_CARE_PROVIDER_SITE_OTHER): Payer: 59 | Admitting: Family Medicine

## 2015-10-26 ENCOUNTER — Encounter: Payer: Self-pay | Admitting: Family Medicine

## 2015-10-26 VITALS — BP 130/90 | HR 84 | Temp 98.1°F | Ht 73.0 in | Wt 278.9 lb

## 2015-10-26 DIAGNOSIS — R911 Solitary pulmonary nodule: Secondary | ICD-10-CM

## 2015-10-26 DIAGNOSIS — I10 Essential (primary) hypertension: Secondary | ICD-10-CM | POA: Diagnosis not present

## 2015-10-26 DIAGNOSIS — E785 Hyperlipidemia, unspecified: Secondary | ICD-10-CM

## 2015-10-26 DIAGNOSIS — F4322 Adjustment disorder with anxiety: Secondary | ICD-10-CM | POA: Diagnosis not present

## 2015-10-26 DIAGNOSIS — E119 Type 2 diabetes mellitus without complications: Secondary | ICD-10-CM

## 2015-10-26 DIAGNOSIS — R41840 Attention and concentration deficit: Secondary | ICD-10-CM

## 2015-10-26 LAB — BASIC METABOLIC PANEL
BUN: 21 mg/dL (ref 6–23)
CALCIUM: 10.1 mg/dL (ref 8.4–10.5)
CHLORIDE: 104 meq/L (ref 96–112)
CO2: 27 meq/L (ref 19–32)
CREATININE: 0.95 mg/dL (ref 0.40–1.50)
GFR: 91.92 mL/min (ref >60.00)
GLUCOSE: 152 mg/dL — AB (ref 70–99)
Potassium: 4.6 mEq/L (ref 3.5–5.1)
SODIUM: 141 meq/L (ref 135–145)

## 2015-10-26 LAB — HEMOGLOBIN A1C: Hgb A1c MFr Bld: 6.8 % — ABNORMAL HIGH (ref 4.6–6.5)

## 2015-10-26 MED ORDER — FENOFIBRATE 54 MG PO TABS: 54.0000 mg | ORAL_TABLET | Freq: Every day | ORAL | Status: DC

## 2015-10-26 MED ORDER — METFORMIN HCL 500 MG PO TABS: 1000.0000 mg | ORAL_TABLET | Freq: Two times a day (BID) | ORAL | Status: DC

## 2015-10-26 MED ORDER — PRAVASTATIN SODIUM 40 MG PO TABS: ORAL_TABLET | ORAL | Status: DC

## 2015-10-26 MED ORDER — ALPRAZOLAM 0.25 MG PO TABS: 0.2500 mg | ORAL_TABLET | Freq: Every day | ORAL | Status: DC | PRN

## 2015-10-26 MED ORDER — PANTOPRAZOLE SODIUM 40 MG PO TBEC: 40.0000 mg | DELAYED_RELEASE_TABLET | Freq: Two times a day (BID) | ORAL | Status: DC

## 2015-10-26 MED ORDER — AMLODIPINE BESYLATE 5 MG PO TABS: 7.5000 mg | ORAL_TABLET | Freq: Every day | ORAL | Status: DC

## 2015-10-26 MED ORDER — LOSARTAN POTASSIUM 100 MG PO TABS: ORAL_TABLET | ORAL | Status: DC

## 2015-10-26 NOTE — Progress Notes (Signed)
HPI:  Michael Jacobs prior pt of Dr. Shawna Orleans, currently with no PCP, here for a med check and several other issues. He has several concerns to discuss today. Primarily, he feels his anxiety is worsening as he has gotten older. He reports a history of generalized  Anxiety and poor focus since he was a child. He reports he was diagnosed with ADHD at age 45, but never took medications for this. 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 He takes the next occasionally for anxiety. Has never taken anything else. He denies a history of depression. He does admit to occasional manic symptoms. Denies thoughts of self-harm, psychosis or panic attacks. Another issue that he wishes to discuss this with history of pulmonary nodules. Reports that Dr. Dennis Bast had ordered a CT scan, but that he has not done this. Reports the x-ray report said to repeat in 2 years, but that his primary doctor wanted him to repeat it in 1 year. He thinks it has been over a year since that scan. He takes amlodipine 5 mg and losartan 100 mg for his blood pressure. He takes pravastatin for his cholesterol and metformin for his diabetes. Reports he gets regular exercise and eats healthy. No chest pain, shortness of breath, headaches, vision changes or wounds. He needs refills on all of his medications.  ROS: See pertinent positives and negatives per HPI.  Past Medical History  Diagnosis Date  . GERD (gastroesophageal reflux disease)   . Hypertension   . Hypercholesterolemia   . Diabetes mellitus ([place])   . Anxiety   . Asthma   . Hiatal hernia   . Cecal ulcer     Past Surgical History  Procedure Laterality Date  . Tonsillectomy and adenoidectomy    . Wisdom tooth extraction    . Upper gastrointestinal endoscopy      Family History  Problem Relation Age of Onset  . Colon polyps Mother   . Colon cancer Maternal  Grandmother   . Diabetes Maternal Grandmother   . Heart disease Maternal Grandmother   . Colon polyps Maternal Grandmother   . Breast cancer Paternal Grandmother   . Stomach cancer Paternal Grandmother     mets to stomach  . Prostate cancer Maternal Grandfather   . Esophageal cancer Neg Hx   . Rectal cancer Neg Hx   . Lung cancer Paternal Grandmother     Social History   Social History  . Marital Status: Married    Spouse Name: N/A  . Number of Children: 2  . Years of Education: N/A   Occupational History  .     Social History Main Topics  . Smoking status: Former Smoker    Types: Cigarettes, Cigars  . Smokeless tobacco: Never Used  . Alcohol Use: 0.0 oz/week    0 Standard drinks or equivalent per week     Comment: occ  . Drug Use: No  . Sexual Activity: Not Asked   Other Topics Concern  . None   Social History Narrative     Current outpatient prescriptions:  .  albuterol (PROVENTIL HFA;VENTOLIN HFA) 108 (90 BASE) MCG/ACT inhaler, Inhale 2 puffs into the lungs every 6 (six) hours as needed., Disp: 8.5 g, Rfl: 5 .  ALPRAZolam (XANAX) 0.25 MG tablet, Take 1 tablet (0.25 mg total) by mouth daily as needed for sleep or anxiety., Disp: 30  tablet, Rfl: 0 .  amLODipine (NORVASC) 5 MG tablet, Take 1.5 tablets (7.5 mg total) by mouth daily. TAKE 1 TABLET (5 MG TOTAL) BY MOUTH DAILY., Disp: 135 tablet, Rfl: 3 .  fenofibrate 54 MG tablet, Take 1 tablet (54 mg total) by mouth daily., Disp: 90 tablet, Rfl: 3 .  losartan (COZAAR) 100 MG tablet, TAKE 1 TABLET (100 MG TOTAL) BY MOUTH DAILY., Disp: 90 tablet, Rfl: 3 .  metFORMIN (GLUCOPHAGE) 500 MG tablet, Take 2 tablets (1,000 mg total) by mouth 2 (two) times daily with a meal., Disp: 360 tablet, Rfl: 3 .  pantoprazole (PROTONIX) 40 MG tablet, Take 1 tablet (40 mg total) by mouth 2 (two) times daily., Disp: 180 tablet, Rfl: 3 .  pravastatin (PRAVACHOL) 40 MG tablet, TAKE 1 TABLET (40 MG TOTAL) BY MOUTH DAILY., Disp: 90 tablet, Rfl:  3 .  sildenafil (VIAGRA) 100 MG tablet, Take 1 tablet (100 mg total) by mouth daily as needed for erectile dysfunction., Disp: 90 tablet, Rfl: 1  EXAM:  Filed Vitals:   10/26/15 0931  BP: 130/90  Pulse: 84  Temp: 98.1 F (36.7 C)    Body mass index is 36.8 kg/(m^2).  GENERAL: vitals reviewed and listed above, alert, oriented, appears well hydrated and in no acute distress  HEENT: atraumatic, conjunttiva clear, no obvious abnormalities on inspection of external nose and ears  NECK: no obvious masses on inspection  LUNGS: clear to auscultation bilaterally, no wheezes, rales or rhonchi, good air movement  CV: HRRR, no peripheral edema  MS: moves all extremities without noticeable abnormality  PSYCH: pleasant and cooperative, no obvious depression or anxiety  ASSESSMENT AND PLAN:  Discussed the following assessment and plan:  Type 2 diabetes mellitus without complication, without long-term current use of insulin (HCC) - Plan: Hemoglobin A1c -lifestyle recommendations -Refilled medications -We'll plan a new patient visit with me in a few months as his prior PCP is no longer in practice  Essential hypertension - Plan: Basic metabolic panel, CBC with Differential/Platelets -increase Norvasc to 7.5 mg daily -labs, refills, lifestyle recommendations  Hyperlipidemia -continue current treatment, Recommendations, may need to add medication for his triglycerides or change to a stronger statin, we'll plan to discuss this further  Adjustment disorder with anxiety Attention and concentration deficit -he would benefit from a psychiatry evaluation and further treatment; discussed options, and he opted to see either Tomasita C[facility] office or the focus clinic -did advise CBT, and he will plan to do this once Dr. Glennon Hamilton is back from maternity leave -Refilled Xanax for now, warned of risks, he uses rarely  Pulmonary nodule - Plan: CT CHEST NODULE FOLLOW UP LOW DOSE W/O   -Patient  advised to return or notify a doctor immediately if symptoms worsen or persist or new concerns arise.  Patient Instructions  BEFORE YOU LEAVE: -labs -follow up: New Patient Visit with Dr. Maudie Mercury  Increase the norvasc to 1.5 tablets (7.5mg ) daily  Call the numbers provided to set up evaluation for the focus and anxiety issues.  -We placed a referral for you as discussed for the CT scan to follow up on the lung nodule. It usually takes about 1-2 weeks to process and schedule this referral. If you have not heard from [place] regarding this appointment in 2 weeks please contact our office.   We recommend the following healthy lifestyle: 1) Small portions - eat off of salad plate instead of dinner plate 2) Eat a healthy clean diet with avoidance of (less then 1 serving  per week) processed foods, sweetened drinks, white starches, red meat, fast foods and sweets and consisting of: * 5-9 servings per day of fresh or frozen fruits and vegetables (not corn or potatoes, not dried or canned) *nuts and seeds, beans *olives and olive oil *small portions of lean meats such as fish and white chicken  *small portions of whole grains 3)Get at least 150 minutes of sweaty aerobic exercise per week 4)reduce stress - counseling, meditation, relaxation to balance other aspects of your life   We have ordered labs or studies at this visit. It can take up to 1-2 weeks for results and processing. IF results require follow up or explanation, we will call you with instructions. Clinically stable results will be released to your [facility]. If you have not heard from [place] or cannot find your results in [facility] in 2 weeks please contact our office at [phone].  If you are not yet signed up for [facility], please consider signing up.           Colin Benton R., DO

## 2015-10-26 NOTE — Patient Instructions (Addendum)
BEFORE YOU LEAVE: -labs -follow up: New Patient Visit with Dr. Maudie Mercury  Increase the norvasc to 1.5 tablets (7.5mg ) daily  Call the numbers provided to set up evaluation for the focus and anxiety issues.  -We placed a referral for you as discussed for the CT scan to follow up on the lung nodule. It usually takes about 1-2 weeks to process and schedule this referral. If you have not heard from [place] regarding this appointment in 2 weeks please contact our office.   We recommend the following healthy lifestyle: 1) Small portions - eat off of salad plate instead of dinner plate 2) Eat a healthy clean diet with avoidance of (less then 1 serving per week) processed foods, sweetened drinks, white starches, red meat, fast foods and sweets and consisting of: * 5-9 servings per day of fresh or frozen fruits and vegetables (not corn or potatoes, not dried or canned) *nuts and seeds, beans *olives and olive oil *small portions of lean meats such as fish and white chicken  *small portions of whole grains 3)Get at least 150 minutes of sweaty aerobic exercise per week 4)reduce stress - counseling, meditation, relaxation to balance other aspects of your life   We have ordered labs or studies at this visit. It can take up to 1-2 weeks for results and processing. IF results require follow up or explanation, we will call you with instructions. Clinically stable results will be released to your [facility]. If you have not heard from [place] or cannot find your results in [facility] in 2 weeks please contact our office at [phone].  If you are not yet signed up for [facility], please consider signing up.

## 2015-10-26 NOTE — Progress Notes (Signed)
Pre visit review using our clinic review tool, if applicable. No additional management support is needed unless otherwise documented below in the visit note. 

## 2015-10-27 LAB — CBC WITH DIFFERENTIAL/PLATELET
BASOS ABS: 0.2 10*3/uL — AB (ref 0.0–0.1)
BASOS PCT: 2.4 % (ref 0.0–3.0)
Eosinophils Absolute: 0.1 10*3/uL (ref 0.0–0.7)
Eosinophils Relative: 1.5 % (ref 0.0–5.0)
HEMATOCRIT: 44.4 % (ref 39.0–52.0)
Hemoglobin: 15 g/dL (ref 13.0–17.0)
LYMPHS ABS: 3.4 10*3/uL (ref 0.7–4.0)
LYMPHS PCT: 44.5 % (ref 12.0–46.0)
MCHC: 33.8 g/dL (ref 30.0–36.0)
MCV: 89.5 fl (ref 78.0–100.0)
MONOS PCT: 5.6 % (ref 3.0–12.0)
Monocytes Absolute: 0.4 10*3/uL (ref 0.1–1.0)
NEUTROS ABS: 3.5 10*3/uL (ref 1.4–7.7)
NEUTROS PCT: 46 % (ref 43.0–77.0)
PLATELETS: 291 10*3/uL (ref 150.0–400.0)
RBC: 4.96 Mil/uL (ref 4.22–5.81)
RDW: 14.3 % (ref 11.5–15.5)
WBC: 7.7 10*3/uL (ref 4.0–10.5)

## 2015-10-28 ENCOUNTER — Telehealth: Payer: Self-pay | Admitting: Internal Medicine

## 2015-10-28 DIAGNOSIS — R911 Solitary pulmonary nodule: Secondary | ICD-10-CM

## 2015-10-28 NOTE — Telephone Encounter (Signed)
Estill Bamberg from [facility] called to see if Dr. Maudie Mercury was meaning to order an ambulatory Referral for the Lung Cancer Program. If so the patients insurance doesn't cover this program due to his age. Insurance requires 55-80.

## 2015-10-29 NOTE — Addendum Note (Signed)
Addended by: Agnes Lawrence on: 10/29/2015 09:46 AM   Modules accepted: Orders

## 2015-10-29 NOTE — Telephone Encounter (Signed)
I called Michael Jacobs and informed her of the message below and she stated they cannot see a patient for a CT if one of the providers there has not seen the pt.  Order was re-entered as previously ordered by Dr Shawna Orleans.

## 2015-10-29 NOTE — Telephone Encounter (Signed)
No, he is supposed to get a CT scan without to follow up on a lung nodule. Can you please find out what epic order is appropriate and re-order? Thanks.

## 2015-11-02 ENCOUNTER — Ambulatory Visit (INDEPENDENT_AMBULATORY_CARE_PROVIDER_SITE_OTHER)
Admission: RE | Admit: 2015-11-02 | Discharge: 2015-11-02 | Disposition: A | Discharge status: Home / Self Care | Payer: 59 | Source: Ambulatory Visit | Attending: Internal Medicine | Admitting: Internal Medicine

## 2015-11-02 DIAGNOSIS — R911 Solitary pulmonary nodule: Secondary | ICD-10-CM

## 2015-11-04 ENCOUNTER — Telehealth: Payer: Self-pay | Admitting: Internal Medicine

## 2015-11-04 DIAGNOSIS — R918 Other nonspecific abnormal finding of lung field: Secondary | ICD-10-CM

## 2015-11-04 NOTE — Telephone Encounter (Signed)
Pt would like ct scan results. Dr Maudie Mercury ordered

## 2015-11-04 NOTE — Telephone Encounter (Signed)
Tried calling pt with No Answer and Mail Box was full.

## 2015-11-04 NOTE — Telephone Encounter (Signed)
Please let him know: It looks like results were routed to Dr. Shawna Orleans! Please let him know nodules are stable and per report are "almost certainly benign". They suggested one more CT in 6 months, then no further needed if stable. We can discuss at his new pt visit. I put in an order for repeat in 6 months. He does have fat in the liver and some minor artery calcifications stable from prior exams. These can also can be discussed further at his visit. Management of his DM, HTN and HLD will be very important. In the interim, the best treatment is a very healthy diet, small portions, wt reduction and healthy active lifestyle. The mediterranean diet is a good diet for reducing CV risks. Thanks.

## 2015-11-04 NOTE — Telephone Encounter (Signed)
Looks like results are in. He does have a NP establish care appt pending 10/1. Thanks.

## 2015-11-10 NOTE — Telephone Encounter (Signed)
Pt is aware of results. 

## 2015-11-16 ENCOUNTER — Telehealth: Payer: Self-pay | Admitting: *Deleted

## 2015-11-16 NOTE — Telephone Encounter (Signed)
Claiborne Billings called from [name] [phone]) and left a message on my voicemail requesting clarification on the instructions for Amlodipine.  I called Claiborne Billings and informed her per the office visit dated 7/17 Dr Maudie Mercury recommended he increase to 7.5mg  (or 1.5 tablets) a day.

## 2015-12-10 ENCOUNTER — Other Ambulatory Visit: Payer: Self-pay | Admitting: *Deleted

## 2015-12-10 MED ORDER — PANTOPRAZOLE SODIUM 40 MG PO TBEC: 40.0000 mg | DELAYED_RELEASE_TABLET | Freq: Two times a day (BID) | ORAL | 3 refills | Status: DC

## 2015-12-10 MED ORDER — METFORMIN HCL 500 MG PO TABS: 1000.0000 mg | ORAL_TABLET | Freq: Two times a day (BID) | ORAL | 3 refills | Status: DC

## 2015-12-10 MED ORDER — PRAVASTATIN SODIUM 40 MG PO TABS: ORAL_TABLET | ORAL | 3 refills | Status: DC

## 2015-12-10 MED ORDER — AMLODIPINE BESYLATE 5 MG PO TABS: 7.5000 mg | ORAL_TABLET | Freq: Every day | ORAL | 3 refills | Status: DC

## 2015-12-10 MED ORDER — ALPRAZOLAM 0.25 MG PO TABS: 0.2500 mg | ORAL_TABLET | Freq: Every day | ORAL | 0 refills | Status: DC | PRN

## 2015-12-10 MED ORDER — LOSARTAN POTASSIUM 100 MG PO TABS: ORAL_TABLET | ORAL | 3 refills | Status: DC

## 2015-12-10 MED ORDER — FENOFIBRATE 54 MG PO TABS: 54.0000 mg | ORAL_TABLET | Freq: Every day | ORAL | 3 refills | Status: DC

## 2015-12-15 ENCOUNTER — Other Ambulatory Visit: Payer: Self-pay | Admitting: *Deleted

## 2015-12-15 NOTE — Telephone Encounter (Signed)
The Rx refill for Alprazolam was sent to [facility] on 8/31 and I called the pt and left a detailed message this was called in to his local pharmacy today as a 90 day supply was not approved on this medication and the 30 day was called to [facility].  I also called [facility] and spoke with Darnelle Maffucci a pharmacist and he stated he did not see this on the pts chart.  I also gave clarification on the Rx for Amlodipine as per the last office note Dr Maudie Mercury wanted him to take 1.5 (to total 7.5mg ) daily and Darnelle Maffucci noted this for the pt.

## 2016-01-10 NOTE — Progress Notes (Signed)
HPI:  Michael Jacobs is here to establish care. Used to see Dr. Shawna Orleans. Last PCP and physical:  Has the following chronic problems that require follow up and concerns today:  DM: -complications: none known -lifestyle: see below -meds: medtformin, arb -eye exam: done per his report - saw Dr. Corrin Parker in the past -foot exam: today -hgba1c 6.8 7/17  HTN: -meds: amodipine 7.5mg  daily, losartan 100 - only taking 5mg  norvascs instructions were confusing -denies: CP, SOB, DOE  HLD/Obesity/fatty liver: -cutting out starches and sweets, trying med diet - wt 285 --> 270 today! -meds: pravastatin 40, fenofibrate 54 - from prior PCP -LDL at goal 06/2015  GAD/?ADD/OCD: -seeing Dr. Johnnye Sima in psychiatry -taking adderall and function much better -xanax prn for panic  L shoulder pain: -remote injury to RTC with football -hurt it again lifting a few months ago -pain is in RTC with abd and ext - stopped lifting -no radiation, weakness, numbness, fevers, malaise  ED: -continues to use viagra as needed, rxd by prior PCP -stable  GERD: -meds: protonix  Pulm nodules: -stable x 1.5 year on imaging 10/2015; one further 6 month repeat CT advised -denies: SOB   ROS negative for unless reported above: fevers, unintentional weight loss, hearing or vision loss, chest pain, palpitations, struggling to breath, hemoptysis, melena, hematochezia, hematuria, falls, loc, si, thoughts of self harm  Past Medical History:  Diagnosis Date  . Anxiety   . Asthma   . Cecal ulcer   . Diabetes mellitus ([place])   . GERD (gastroesophageal reflux disease)   . Hiatal hernia   . Hypercholesterolemia   . Hypertension     Past Surgical History:  Procedure Laterality Date  . TONSILLECTOMY AND ADENOIDECTOMY    . UPPER GASTROINTESTINAL ENDOSCOPY    . WISDOM TOOTH EXTRACTION      Family History  Problem Relation Age of Onset  . Colon polyps Mother   . Colon cancer Maternal Grandmother   . Diabetes  Maternal Grandmother   . Heart disease Maternal Grandmother   . Colon polyps Maternal Grandmother   . Breast cancer Paternal Grandmother   . Stomach cancer Paternal Grandmother     mets to stomach  . Prostate cancer Maternal Grandfather   . Esophageal cancer Neg Hx   . Rectal cancer Neg Hx   . Lung cancer Paternal Grandmother     Social History   Social History  . Marital status: Married    Spouse name: N/A  . Number of children: 2  . Years of education: N/A   Occupational History  .  [employer]   Social History Main Topics  . Smoking status: Former Smoker    Types: Cigarettes, Cigars  . Smokeless tobacco: Never Used  . Alcohol use 0.0 oz/week     Comment: occ  . Drug use: No  . Sexual activity: Not Asked   Other Topics Concern  . None   Social History Narrative  . None     Current Outpatient Prescriptions:  .  albuterol (PROVENTIL HFA;VENTOLIN HFA) 108 (90 BASE) MCG/ACT inhaler, Inhale 2 puffs into the lungs every 6 (six) hours as needed., Disp: 8.5 g, Rfl: 5 .  ALPRAZolam (XANAX) 0.25 MG tablet, Take 1 tablet (0.25 mg total) by mouth daily as needed for sleep or anxiety., Disp: 30 tablet, Rfl: 0 .  amLODipine (NORVASC) 5 MG tablet, Take 1.5 tablets (7.5 mg total) by mouth daily., Disp: 135 tablet, Rfl: 3 .  fenofibrate 54 MG tablet, Take 1  tablet (54 mg total) by mouth daily., Disp: 90 tablet, Rfl: 3 .  losartan (COZAAR) 100 MG tablet, TAKE 1 TABLET (100 MG TOTAL) BY MOUTH DAILY., Disp: 90 tablet, Rfl: 3 .  metFORMIN (GLUCOPHAGE) 500 MG tablet, Take 2 tablets (1,000 mg total) by mouth 2 (two) times daily with a meal., Disp: 360 tablet, Rfl: 3 .  pantoprazole (PROTONIX) 40 MG tablet, Take 1 tablet (40 mg total) by mouth 2 (two) times daily., Disp: 180 tablet, Rfl: 3 .  pravastatin (PRAVACHOL) 40 MG tablet, TAKE 1 TABLET (40 MG TOTAL) BY MOUTH DAILY., Disp: 90 tablet, Rfl: 3 .  sildenafil (VIAGRA) 100 MG tablet, Take 1 tablet (100 mg total) by mouth daily as needed  for erectile dysfunction., Disp: 90 tablet, Rfl: 1  EXAM:  Vitals:   01/11/16 1140  BP: 130/90  Pulse: 75  Temp: 97.4 F (36.3 C)    Body mass index is 35.67 kg/m.  GENERAL: vitals reviewed and listed above, alert, oriented, appears well hydrated and in no acute distress  HEENT: atraumatic, conjunttiva clear, no obvious abnormalities on inspection of external nose and ears  NECK: no obvious masses on inspection  LUNGS: clear to auscultation bilaterally, no wheezes, rales or rhonchi, good air movement  CV: HRRR, no peripheral edema  MS: moves all extremities without noticeable abnormality; normal inspection of the shoulders, no significant bony or soft tissue tenderness to palpation, positive impingement test, otherwise nor normal exam with negative shawl sign negative apprehension test negative, negative empty can and normal strength in all motions of the shoulder and upper extremities bilaterally  PSYCH: pleasant and cooperative, no obvious depression or anxiety   Foot exam done  ASSESSMENT AND PLAN:  Discussed the following assessment and plan: More than 50% of over 40 minutes spent in total in caring for this patient was spent face-to-face with the patient, counseling and/or coordinating care.   Essential hypertension -increase norvasc per plan last visit -congratulated and supported on continued lifestyle changes  Acute pain of left shoulder -suspect RTC pathology -opted for HEP and follow up in 2-3 months  BMI 35.0-35.9,adult Other hyperlipidemia -congratulated and supported on lifestyle changes, encouraged lifeslong commitment, wife seems to be on board which is helpful  Type 2 diabetes mellitus without complication, without long-term current use of insulin ([place]) -cont current tx  Encounter for immunization - Plan: Flu Vaccine QUAD 36+ mos IM  Encounter to establish care -We reviewed the PMH, PSH, FH, SH, Meds and Allergies. -We provided refills for any  medications we will prescribe as needed. -We addressed current concerns per orders and patient instructions. -We have asked for records for pertinent exams, studies, vaccines and notes from previous providers. -We have advised patient to follow up per instructions below. -lifestyle recs, advised lifelong committment to healthy diet, regular exercise, no alcohol -CPE in 3-4 months advise -flu shot offered -foot exam done -eye exam advised  -Patient advised to return or notify a doctor immediately if symptoms worsen or persist or new concerns arise.  Patient Instructions  BEFORE YOU LEAVE: -follow up: 3 months -rotator cuff exercises -flu shot  Follow up with your psychiatrist regarding the xanax.  Do the exercises for the shoudler 4 days per week.  Make sure you are taking 1.5 tablets of the norvasc (amlodipine) daily.  We recommend the following healthy lifestyle for LIFE: 1) Small portions.   Tip: eat off of a salad plate instead of a dinner plate.  Tip: It is ok to feel hungry  after a meal - that likely means you ate an appropriate portion.  Tip: if you need more or a snack choose fruits, veggies and/or a handful of nuts or seeds.  2) Eat a healthy clean diet.  * Tip: Avoid (less then 1 serving per week): processed foods, sweets, sweetened drinks, white starches (rice, flour, bread, potatoes, pasta, etc), red meat, fast foods, butter  *Tip: CHOOSE instead   * 5-9 servings per day of fresh or frozen fruits and vegetables (but not corn, potatoes, bananas, canned or dried fruit)   *nuts and seeds, beans   *olives and olive oil   *small portions of lean meats such as fish and white chicken    *small portions of whole grains  3)Get at least 150 minutes of sweaty aerobic exercise per week.  4)Reduce stress - consider counseling, meditation and relaxation to balance other aspects of your life.     Colin Benton R.

## 2016-01-11 ENCOUNTER — Ambulatory Visit (INDEPENDENT_AMBULATORY_CARE_PROVIDER_SITE_OTHER): Payer: 59 | Admitting: Family Medicine

## 2016-01-11 ENCOUNTER — Encounter: Payer: Self-pay | Admitting: Family Medicine

## 2016-01-11 VITALS — BP 130/90 | HR 75 | Temp 97.4°F | Ht 73.0 in | Wt 270.4 lb

## 2016-01-11 DIAGNOSIS — E784 Other hyperlipidemia: Secondary | ICD-10-CM | POA: Diagnosis not present

## 2016-01-11 DIAGNOSIS — Z23 Encounter for immunization: Secondary | ICD-10-CM

## 2016-01-11 DIAGNOSIS — I10 Essential (primary) hypertension: Secondary | ICD-10-CM

## 2016-01-11 DIAGNOSIS — E119 Type 2 diabetes mellitus without complications: Secondary | ICD-10-CM | POA: Diagnosis not present

## 2016-01-11 DIAGNOSIS — Z7689 Persons encountering health services in other specified circumstances: Secondary | ICD-10-CM | POA: Diagnosis not present

## 2016-01-11 DIAGNOSIS — M25512 Pain in left shoulder: Secondary | ICD-10-CM

## 2016-01-11 DIAGNOSIS — Z6835 Body mass index (BMI) 35.0-35.9, adult: Secondary | ICD-10-CM

## 2016-01-11 DIAGNOSIS — K219 Gastro-esophageal reflux disease without esophagitis: Secondary | ICD-10-CM

## 2016-01-11 DIAGNOSIS — E7849 Other hyperlipidemia: Secondary | ICD-10-CM

## 2016-01-11 MED ORDER — AMLODIPINE BESYLATE 5 MG PO TABS: 7.5000 mg | ORAL_TABLET | Freq: Every day | ORAL | 3 refills | Status: DC

## 2016-01-11 NOTE — Progress Notes (Signed)
Pre visit review using our clinic review tool, if applicable. No additional management support is needed unless otherwise documented below in the visit note. 

## 2016-01-11 NOTE — Patient Instructions (Addendum)
BEFORE YOU LEAVE: -follow up: 3 months -rotator cuff exercises -flu shot  Follow up with your psychiatrist regarding the xanax.  Do the exercises for the shoudler 4 days per week.  Make sure you are taking 1.5 tablets of the norvasc (amlodipine) daily.  We recommend the following healthy lifestyle for LIFE: 1) Small portions.   Tip: eat off of a salad plate instead of a dinner plate.  Tip: It is ok to feel hungry after a meal - that likely means you ate an appropriate portion.  Tip: if you need more or a snack choose fruits, veggies and/or a handful of nuts or seeds.  2) Eat a healthy clean diet.  * Tip: Avoid (less then 1 serving per week): processed foods, sweets, sweetened drinks, white starches (rice, flour, bread, potatoes, pasta, etc), red meat, fast foods, butter  *Tip: CHOOSE instead   * 5-9 servings per day of fresh or frozen fruits and vegetables (but not corn, potatoes, bananas, canned or dried fruit)   *nuts and seeds, beans   *olives and olive oil   *small portions of lean meats such as fish and white chicken    *small portions of whole grains  3)Get at least 150 minutes of sweaty aerobic exercise per week.  4)Reduce stress - consider counseling, meditation and relaxation to balance other aspects of your life.

## 2016-03-07 ENCOUNTER — Other Ambulatory Visit: Payer: Self-pay | Admitting: *Deleted

## 2016-03-10 ENCOUNTER — Other Ambulatory Visit: Payer: Self-pay | Admitting: *Deleted

## 2016-03-21 ENCOUNTER — Telehealth: Payer: Self-pay | Admitting: Family Medicine

## 2016-03-22 NOTE — Telephone Encounter (Signed)
I left a detailed message on the voicemail of the pharmacy with the information below.

## 2016-03-22 NOTE — Telephone Encounter (Signed)
Please call pharmacy. We keep getting refill request for his xanax. He sees a psychiatrist and this should be refilled by his psychiatrist. Thanks.

## 2016-03-23 ENCOUNTER — Other Ambulatory Visit: Payer: Self-pay | Admitting: Family Medicine

## 2016-03-23 NOTE — Telephone Encounter (Signed)
Pt has an appt next month with the psychiatrist.  He has not ask the psychiatrist yet if they will fill the xanax. So pt states he needs med to get him through to his appt Jan 24, he will ask her then. Would like to know if Dr Maudie Mercury will fill now.  [facility] [place]

## 2016-03-24 NOTE — Telephone Encounter (Signed)
I will have him follow up with psychiatry. Sorry

## 2016-03-24 NOTE — Telephone Encounter (Signed)
I left a detailed message at the pts cell number the Rx was denied and to call his psychiatrist's office for refills.

## 2016-04-13 NOTE — Progress Notes (Signed)
HPI:  Michael Jacobs is a pleasant 46 yo with a PMH significant for DM, HTN, HLD, Obesity, fatty liver, ED, depression and anxiety here for follow up. Sees psychiatrist. Due for labs. Reports he did his eye exam with Dr. Michel Jacobs this year. Reports he has been really working on the lifestyle with improvement in portion sizes and diet and is trying to get regular exercise. He has had some success with some weight loss recently. He requests a refill on Xanax until he is able to see his psychiatrist. Reports he uses a half of a tablet rarely for panic attack. He will be seeing a psychiatrist in a few weeks. No worsening depression, worsening anxiety, hallucinations or thoughts of self-harm. Reports he overall is doing much better now that he is on Adderall.  DM: -complications: none known -lifestyle: see below -meds: medtformin, arb -eye exam: Dr. Corrin Jacobs in the past  HTN: -meds: amodipine 7.5mg  daily, losartan 100   HLD/Obesity/fatty liver: -cutting out starches and sweets, trying med diet - wt 285 --> 263 -meds: pravastatin 40, fenofibrate 54 - from prior PCP -LDL at goal 06/2015  GAD/?ADD/OCD: -seeing Dr. Johnnye Jacobs in psychiatry -taking adderall and function much better -xanax prn for panic  L shoulder pain: -remote injury to RTC with football  ED: -continues to use viagra as needed, rxd by prior PCP -stable  GERD: -meds: protonix  Pulm nodules: -stable x 1.5 year on imaging 10/2015; one further 6 month repeat CT advised -denies: SOB  ROS: See pertinent positives and negatives per HPI.  Past Medical History:  Diagnosis Date  . Anxiety   . Asthma   . Cecal ulcer   . Diabetes mellitus ([place])   . GERD (gastroesophageal reflux disease)   . Hiatal hernia   . Hypercholesterolemia   . Hypertension     Past Surgical History:  Procedure Laterality Date  . TONSILLECTOMY AND ADENOIDECTOMY    . UPPER GASTROINTESTINAL ENDOSCOPY    . WISDOM TOOTH EXTRACTION       Family History  Problem Relation Age of Onset  . Colon polyps Mother   . Colon cancer Maternal Grandmother   . Diabetes Maternal Grandmother   . Heart disease Maternal Grandmother   . Colon polyps Maternal Grandmother   . Breast cancer Paternal Grandmother   . Stomach cancer Paternal Grandmother     mets to stomach  . Prostate cancer Maternal Grandfather   . Esophageal cancer Neg Hx   . Rectal cancer Neg Hx   . Lung cancer Paternal Grandmother     Social History   Social History  . Marital status: Married    Spouse name: N/A  . Number of children: 2  . Years of education: N/A   Occupational History  .  Michael Jacobs   Social History Main Topics  . Smoking status: Former Smoker    Types: Cigarettes, Cigars  . Smokeless tobacco: Never Used  . Alcohol use 0.0 oz/week     Comment: occ  . Drug use: No  . Sexual activity: Not Asked   Other Topics Concern  . None   Social History Narrative  . None     Current Outpatient Prescriptions:  .  albuterol (PROVENTIL HFA;VENTOLIN HFA) 108 (90 BASE) MCG/ACT inhaler, Inhale 2 puffs into the lungs every 6 (six) hours as needed., Disp: 8.5 g, Rfl: 5 .  ALPRAZolam (XANAX) 0.25 MG tablet, Take 1 tablet (0.25 mg total) by mouth daily as needed for sleep or anxiety., Disp: 10 tablet,  Rfl: 0 .  amLODipine (NORVASC) 5 MG tablet, Take 1.5 tablets (7.5 mg total) by mouth daily., Disp: 135 tablet, Rfl: 3 .  fenofibrate 54 MG tablet, Take 1 tablet (54 mg total) by mouth daily., Disp: 90 tablet, Rfl: 3 .  losartan (COZAAR) 100 MG tablet, TAKE 1 TABLET (100 MG TOTAL) BY MOUTH DAILY., Disp: 90 tablet, Rfl: 3 .  metFORMIN (GLUCOPHAGE) 500 MG tablet, Take 2 tablets (1,000 mg total) by mouth 2 (two) times daily with a meal., Disp: 360 tablet, Rfl: 3 .  pantoprazole (PROTONIX) 40 MG tablet, Take 1 tablet (40 mg total) by mouth 2 (two) times daily., Disp: 180 tablet, Rfl: 3 .  pravastatin (PRAVACHOL) 40 MG tablet, TAKE 1 TABLET (40 MG TOTAL) BY  MOUTH DAILY., Disp: 90 tablet, Rfl: 3 .  sildenafil (VIAGRA) 100 MG tablet, Take 1 tablet (100 mg total) by mouth daily as needed for erectile dysfunction., Disp: 90 tablet, Rfl: 1  EXAM:  Vitals:   04/14/16 1022  BP: 116/88  Pulse: 98  Temp: 97.6 F (36.4 C)    Body mass index is 34.7 kg/m.  GENERAL: vitals reviewed and listed above, alert, oriented, appears well hydrated and in no acute distress  HEENT: atraumatic, conjunttiva clear, no obvious abnormalities on inspection of external nose and ears  NECK: no obvious masses on inspection  LUNGS: clear to auscultation bilaterally, no wheezes, rales or rhonchi, good air movement  CV: HRRR, no peripheral edema  MS: moves all extremities without noticeable abnormality  PSYCH: pleasant and cooperative, no obvious depression or anxiety  ASSESSMENT AND PLAN:  Discussed the following assessment and plan:  Generalized anxiety disorder  Essential hypertension - Plan: Basic metabolic panel, CBC  Other hyperlipidemia - Plan: Lipid panel  BMI 34.0-34.9,adult  Pulmonary nodule - Plan: CT Chest Wo Contrast  Type 2 diabetes mellitus without complication, without long-term current use of insulin ([place]) - Plan: Hemoglobin A1c  -Labs today -Congratulated and supported on lifestyle changes -Discussed risks with benzos, advised no further refills moving forward as would advise that this comes from his psychiatrist, small amount provided until he can see his psychiatrist. Did advise that he pursue CBT to also assist with his anxiety. -Due for CT to follow up on pulmonary nodules; assistant advised to order -Advised assistant to obtain an up-to-date eye exam -Patient advised to return or notify a doctor immediately if symptoms worsen or persist or new concerns arise.  Patient Instructions  BEFORE YOU LEAVE: -follow up: 3-4 months -labs -please obtain the eye report and abstract -please see if pt is scheduled for repeat CT scan chest  for pulm nodule  We have ordered labs or studies at this visit. It can take up to 1-2 weeks for results and processing. IF results require follow up or explanation, we will call you with instructions. Clinically stable results will be released to your [facility]. If you have not heard from [place] or cannot find your results in [facility] in 2 weeks please contact our office at [phone].  If you are not yet signed up for [facility], please consider signing up.  We recommend the following healthy lifestyle for LIFE: 1) Small portions.   Tip: eat off of a salad plate instead of a dinner plate.  Tip: It is ok to feel hungry after a meal - that likely means you ate an appropriate portion.  Tip: if you need more or a snack choose fruits, veggies and/or a handful of nuts or seeds.  2) Eat  a healthy clean diet.  * Tip: Avoid (less then 1 serving per week): processed foods, sweets, sweetened drinks, white starches (rice, flour, bread, potatoes, pasta, etc), red meat, fast foods, butter  *Tip: CHOOSE instead   * 5-9 servings per day of fresh or frozen fruits and vegetables (but not corn, potatoes, bananas, canned or dried fruit)   *nuts and seeds, beans   *olives and olive oil   *small portions of lean meats such as fish and white chicken    *small portions of whole grains  3)Get at least 150 minutes of sweaty aerobic exercise per week.  4)Reduce stress - consider counseling, meditation and relaxation to balance other aspects of your life.     Colin Benton R., DO

## 2016-04-14 ENCOUNTER — Ambulatory Visit (INDEPENDENT_AMBULATORY_CARE_PROVIDER_SITE_OTHER): Payer: 59 | Admitting: Family Medicine

## 2016-04-14 ENCOUNTER — Encounter: Payer: Self-pay | Admitting: Family Medicine

## 2016-04-14 VITALS — BP 116/88 | HR 98 | Temp 97.6°F | Ht 73.0 in | Wt 263.0 lb

## 2016-04-14 DIAGNOSIS — E7849 Other hyperlipidemia: Secondary | ICD-10-CM

## 2016-04-14 DIAGNOSIS — F411 Generalized anxiety disorder: Secondary | ICD-10-CM

## 2016-04-14 DIAGNOSIS — Z6834 Body mass index (BMI) 34.0-34.9, adult: Secondary | ICD-10-CM | POA: Diagnosis not present

## 2016-04-14 DIAGNOSIS — I10 Essential (primary) hypertension: Secondary | ICD-10-CM

## 2016-04-14 DIAGNOSIS — E784 Other hyperlipidemia: Secondary | ICD-10-CM

## 2016-04-14 DIAGNOSIS — E119 Type 2 diabetes mellitus without complications: Secondary | ICD-10-CM

## 2016-04-14 DIAGNOSIS — R911 Solitary pulmonary nodule: Secondary | ICD-10-CM

## 2016-04-14 LAB — BASIC METABOLIC PANEL
BUN: 14 mg/dL (ref 6–23)
CALCIUM: 10.2 mg/dL (ref 8.4–10.5)
CO2: 30 mEq/L (ref 19–32)
CREATININE: 1.06 mg/dL (ref 0.40–1.50)
Chloride: 106 mEq/L (ref 96–112)
GFR: 80.82 mL/min (ref >60.00)
Glucose, Bld: 128 mg/dL — ABNORMAL HIGH (ref 70–99)
Potassium: 4.5 mEq/L (ref 3.5–5.1)
SODIUM: 143 meq/L (ref 135–145)

## 2016-04-14 LAB — HM DIABETES EYE EXAM

## 2016-04-14 LAB — HEMOGLOBIN A1C: HEMOGLOBIN A1C: 5.9 % (ref 4.6–6.5)

## 2016-04-14 LAB — LIPID PANEL
CHOL/HDL RATIO: 4
CHOLESTEROL: 145 mg/dL (ref 0–200)
HDL: 39.9 mg/dL (ref >39.00)
LDL CALC: 76 mg/dL (ref 0–99)
NonHDL: 104.96
TRIGLYCERIDES: 144 mg/dL (ref 0.0–149.0)
VLDL: 28.8 mg/dL (ref 0.0–40.0)

## 2016-04-14 LAB — CBC
HCT: 46.7 % (ref 39.0–52.0)
Hemoglobin: 15.8 g/dL (ref 13.0–17.0)
MCHC: 33.7 g/dL (ref 30.0–36.0)
MCV: 89.6 fl (ref 78.0–100.0)
PLATELETS: 337 10*3/uL (ref 150.0–400.0)
RBC: 5.21 Mil/uL (ref 4.22–5.81)
RDW: 14 % (ref 11.5–15.5)
WBC: 9.6 10*3/uL (ref 4.0–10.5)

## 2016-04-14 MED ORDER — ALPRAZOLAM 0.25 MG PO TABS: 0.2500 mg | ORAL_TABLET | Freq: Every day | ORAL | 0 refills | Status: DC | PRN

## 2016-04-14 NOTE — Patient Instructions (Addendum)
BEFORE YOU LEAVE: -follow up: 3-4 months -labs -please obtain the eye report and abstract -please see if pt is scheduled for repeat CT scan chest for pulm nodule  We have ordered labs or studies at this visit. It can take up to 1-2 weeks for results and processing. IF results require follow up or explanation, we will call you with instructions. Clinically stable results will be released to your [facility]. If you have not heard from [place] or cannot find your results in [facility] in 2 weeks please contact our office at [phone].  If you are not yet signed up for [facility], please consider signing up.  We recommend the following healthy lifestyle for LIFE: 1) Small portions.   Tip: eat off of a salad plate instead of a dinner plate.  Tip: It is ok to feel hungry after a meal - that likely means you ate an appropriate portion.  Tip: if you need more or a snack choose fruits, veggies and/or a handful of nuts or seeds.  2) Eat a healthy clean diet.  * Tip: Avoid (less then 1 serving per week): processed foods, sweets, sweetened drinks, white starches (rice, flour, bread, potatoes, pasta, etc), red meat, fast foods, butter  *Tip: CHOOSE instead   * 5-9 servings per day of fresh or frozen fruits and vegetables (but not corn, potatoes, bananas, canned or dried fruit)   *nuts and seeds, beans   *olives and olive oil   *small portions of lean meats such as fish and white chicken    *small portions of whole grains  3)Get at least 150 minutes of sweaty aerobic exercise per week.  4)Reduce stress - consider counseling, meditation and relaxation to balance other aspects of your life.

## 2016-04-14 NOTE — Progress Notes (Signed)
Pre visit review using our clinic review tool, if applicable. No additional management support is needed unless otherwise documented below in the visit note. 

## 2016-04-21 ENCOUNTER — Encounter: Payer: Self-pay | Admitting: Family Medicine

## 2016-05-31 ENCOUNTER — Other Ambulatory Visit: Payer: Self-pay | Admitting: *Deleted

## 2016-07-11 NOTE — Progress Notes (Deleted)
HPI:  Michael Jacobs is a pleasant 46 y.o. here for follow up. Chronic medical problems summarized below were reviewed for changes and stability and were updated as needed below. These issues and their treatment remain stable for the most part. ***. Denies CP, SOB, DOE, treatment intolerance or new symptoms.  DM: -complications: none known -lifestyle: see below -meds: medtformin, arb -eye exam: Dr. Corrin Parker in the past  HTN: -meds: amodipine 7.5mg  daily, losartan 100   HLD/Obesity/fatty liver: -cutting out starches and sweets, trying med diet - wt 285 --> 263 --> -meds: pravastatin 40, fenofibrate 54 - from prior PCP  GAD/?ADD/OCD: -seeing Dr. Johnnye Sima in psychiatry -taking adderal -xanax prn for panic  L shoulder pain: -remote injury to RTC with football  ED: -continues to use viagra as needed, rxd by prior PCP -stable  GERD: -meds: protonix  Pulm nodules: -stable x 1.5 year on imaging 10/2015; one further 6 month repeat CT advised - this was ordered, but I do not see that it was done -denies: SOB  ROS: See pertinent positives and negatives per HPI.  Past Medical History:  Diagnosis Date  . Anxiety   . Asthma   . Cecal ulcer   . Diabetes mellitus ([place])   . GERD (gastroesophageal reflux disease)   . Hiatal hernia   . Hypercholesterolemia   . Hypertension     Past Surgical History:  Procedure Laterality Date  . TONSILLECTOMY AND ADENOIDECTOMY    . UPPER GASTROINTESTINAL ENDOSCOPY    . WISDOM TOOTH EXTRACTION      Family History  Problem Relation Age of Onset  . Colon polyps Mother   . Colon cancer Maternal Grandmother   . Diabetes Maternal Grandmother   . Heart disease Maternal Grandmother   . Colon polyps Maternal Grandmother   . Breast cancer Paternal Grandmother   . Stomach cancer Paternal Grandmother     mets to stomach  . Prostate cancer Maternal Grandfather   . Esophageal cancer Neg Hx   . Rectal cancer Neg Hx   . Lung cancer  Paternal Grandmother     Social History   Social History  . Marital status: Married    Spouse name: N/A  . Number of children: 2  . Years of education: N/A   Occupational History  .  [employer]   Social History Main Topics  . Smoking status: Former Smoker    Types: Cigarettes, Cigars  . Smokeless tobacco: Never Used  . Alcohol use 0.0 oz/week     Comment: occ  . Drug use: No  . Sexual activity: Not on file   Other Topics Concern  . Not on file   Social History Narrative  . No narrative on file     Current Outpatient Prescriptions:  .  albuterol (PROVENTIL HFA;VENTOLIN HFA) 108 (90 BASE) MCG/ACT inhaler, Inhale 2 puffs into the lungs every 6 (six) hours as needed., Disp: 8.5 g, Rfl: 5 .  ALPRAZolam (XANAX) 0.25 MG tablet, Take 1 tablet (0.25 mg total) by mouth daily as needed for sleep or anxiety., Disp: 10 tablet, Rfl: 0 .  amLODipine (NORVASC) 5 MG tablet, Take 1.5 tablets (7.5 mg total) by mouth daily., Disp: 135 tablet, Rfl: 3 .  fenofibrate 54 MG tablet, Take 1 tablet (54 mg total) by mouth daily., Disp: 90 tablet, Rfl: 3 .  losartan (COZAAR) 100 MG tablet, TAKE 1 TABLET (100 MG TOTAL) BY MOUTH DAILY., Disp: 90 tablet, Rfl: 3 .  metFORMIN (GLUCOPHAGE) 500 MG tablet, Take 2  tablets (1,000 mg total) by mouth 2 (two) times daily with a meal., Disp: 360 tablet, Rfl: 3 .  pantoprazole (PROTONIX) 40 MG tablet, Take 1 tablet (40 mg total) by mouth 2 (two) times daily., Disp: 180 tablet, Rfl: 3 .  pravastatin (PRAVACHOL) 40 MG tablet, TAKE 1 TABLET (40 MG TOTAL) BY MOUTH DAILY., Disp: 90 tablet, Rfl: 3 .  sildenafil (VIAGRA) 100 MG tablet, Take 1 tablet (100 mg total) by mouth daily as needed for erectile dysfunction., Disp: 90 tablet, Rfl: 1  EXAM:  There were no vitals filed for this visit.  There is no height or weight on file to calculate BMI.  GENERAL: vitals reviewed and listed above, alert, oriented, appears well hydrated and in no acute distress  HEENT:  atraumatic, conjunttiva clear, no obvious abnormalities on inspection of external nose and ears  NECK: no obvious masses on inspection  LUNGS: clear to auscultation bilaterally, no wheezes, rales or rhonchi, good air movement  CV: HRRR, no peripheral edema  MS: moves all extremities without noticeable abnormality  PSYCH: pleasant and cooperative, no obvious depression or anxiety  ASSESSMENT AND PLAN:  Discussed the following assessment and plan:  No diagnosis found.  -Patient advised to return or notify a doctor immediately if symptoms worsen or persist or new concerns arise.  There are no Patient Instructions on file for this visit.  Colin Benton R., DO

## 2016-07-12 ENCOUNTER — Encounter: Payer: Self-pay | Admitting: Family Medicine

## 2016-07-12 ENCOUNTER — Ambulatory Visit: Payer: 59 | Admitting: Family Medicine

## 2016-07-12 NOTE — Progress Notes (Signed)
No show

## 2016-08-15 ENCOUNTER — Telehealth: Payer: Self-pay | Admitting: Family Medicine

## 2016-08-15 MED ORDER — PANTOPRAZOLE SODIUM 40 MG PO TBEC: 40.0000 mg | DELAYED_RELEASE_TABLET | Freq: Two times a day (BID) | ORAL | 2 refills | Status: DC

## 2016-08-15 MED ORDER — AMLODIPINE BESYLATE 5 MG PO TABS: 7.5000 mg | ORAL_TABLET | Freq: Every day | ORAL | 2 refills | Status: DC

## 2016-08-15 MED ORDER — METFORMIN HCL 500 MG PO TABS
1000.0000 mg | ORAL_TABLET | Freq: Two times a day (BID) | ORAL | 2 refills | Status: DC
Start: 2016-08-15 — End: 2017-03-07

## 2016-08-15 MED ORDER — PRAVASTATIN SODIUM 40 MG PO TABS: ORAL_TABLET | ORAL | 2 refills | Status: DC

## 2016-08-15 MED ORDER — LOSARTAN POTASSIUM 100 MG PO TABS: ORAL_TABLET | ORAL | 2 refills | Status: DC

## 2016-08-15 MED ORDER — FENOFIBRATE 54 MG PO TABS: 54.0000 mg | ORAL_TABLET | Freq: Every day | ORAL | 2 refills | Status: DC

## 2016-08-15 NOTE — Telephone Encounter (Signed)
Rx done. 

## 2016-08-15 NOTE — Telephone Encounter (Signed)
Pharmacy called for pt to request a refill of  amLODipine (NORVASC) 5 MG tablet fenofibrate 54 MG tablet losartan (COZAAR) 100 MG tablet metFORMIN (GLUCOPHAGE) 500 MG tablet pravastatin (PRAVACHOL) 40 MG tablet pantoprazole (PROTONIX) 40 MG tablet   *NEW pharmacy*  [facility]  Pharmacy states pt has been having issues getting [facility] meds [facility] to [facility] now.

## 2016-08-30 ENCOUNTER — Ambulatory Visit (INDEPENDENT_AMBULATORY_CARE_PROVIDER_SITE_OTHER): Payer: 59 | Admitting: Family Medicine

## 2016-08-30 ENCOUNTER — Encounter: Payer: Self-pay | Admitting: Family Medicine

## 2016-08-30 ENCOUNTER — Telehealth: Payer: Self-pay | Admitting: *Deleted

## 2016-08-30 VITALS — BP 122/90 | HR 79 | Temp 98.3°F | Ht 73.0 in | Wt 264.9 lb

## 2016-08-30 DIAGNOSIS — I10 Essential (primary) hypertension: Secondary | ICD-10-CM

## 2016-08-30 DIAGNOSIS — E119 Type 2 diabetes mellitus without complications: Secondary | ICD-10-CM | POA: Diagnosis not present

## 2016-08-30 DIAGNOSIS — E1159 Type 2 diabetes mellitus with other circulatory complications: Secondary | ICD-10-CM | POA: Diagnosis not present

## 2016-08-30 DIAGNOSIS — R938 Abnormal findings on diagnostic imaging of other specified body structures: Secondary | ICD-10-CM | POA: Diagnosis not present

## 2016-08-30 DIAGNOSIS — E1169 Type 2 diabetes mellitus with other specified complication: Secondary | ICD-10-CM | POA: Diagnosis not present

## 2016-08-30 DIAGNOSIS — R9389 Abnormal findings on diagnostic imaging of other specified body structures: Secondary | ICD-10-CM

## 2016-08-30 DIAGNOSIS — F4322 Adjustment disorder with anxiety: Secondary | ICD-10-CM

## 2016-08-30 DIAGNOSIS — E785 Hyperlipidemia, unspecified: Secondary | ICD-10-CM | POA: Diagnosis not present

## 2016-08-30 DIAGNOSIS — I152 Hypertension secondary to endocrine disorders: Secondary | ICD-10-CM

## 2016-08-30 DIAGNOSIS — Z6834 Body mass index (BMI) 34.0-34.9, adult: Secondary | ICD-10-CM

## 2016-08-30 LAB — BASIC METABOLIC PANEL
BUN: 15 mg/dL (ref 6–23)
CHLORIDE: 104 meq/L (ref 96–112)
CO2: 25 meq/L (ref 19–32)
Calcium: 9.6 mg/dL (ref 8.4–10.5)
Creatinine, Ser: 0.89 mg/dL (ref 0.40–1.50)
GFR: 98.71 mL/min (ref >60.00)
Glucose, Bld: 128 mg/dL — ABNORMAL HIGH (ref 70–99)
POTASSIUM: 4.3 meq/L (ref 3.5–5.1)
Sodium: 139 mEq/L (ref 135–145)

## 2016-08-30 LAB — CBC
HEMATOCRIT: 43.4 % (ref 39.0–52.0)
HEMOGLOBIN: 14.8 g/dL (ref 13.0–17.0)
MCHC: 34 g/dL (ref 30.0–36.0)
MCV: 88.2 fl (ref 78.0–100.0)
Platelets: 387 10*3/uL (ref 150.0–400.0)
RBC: 4.92 Mil/uL (ref 4.22–5.81)
RDW: 12.9 % (ref 11.5–15.5)
WBC: 7.7 10*3/uL (ref 4.0–10.5)

## 2016-08-30 LAB — HEMOGLOBIN A1C: Hgb A1c MFr Bld: 6.6 % — ABNORMAL HIGH (ref 4.6–6.5)

## 2016-08-30 MED ORDER — AMLODIPINE BESYLATE 5 MG PO TABS: 7.5000 mg | ORAL_TABLET | Freq: Every day | ORAL | 2 refills | Status: DC

## 2016-08-30 NOTE — Progress Notes (Signed)
HPI:  Michael Jacobs is a pleasant 46 y.o. here for follow up. Chronic medical problems summarized below were reviewed for changes and stability and were updated as needed below. These issues and their treatment remain stable for the most part. Reports is doing well. Discussed his BP and he is agreeable to starting medication. He reports nobody called him about repeat CT regarding his hx pulm nodules. Denies CP, SOB, DOE, treatment intolerance or new symptoms. Due for BP and diabetes labs   DM: -meds: metformin 1000bid, arb, statin  HTN: -meds: amlodipine 5, losartan 100  HLD/Obesity/Fatty liver: -meds fenofibrate, pravastatin  ED: -meds: viagra  Anxiety, focus issues and Depression: -seeing psychiatrist for management -meds: adderall, xanax   ROS: See pertinent positives and negatives per HPI.  Past Medical History:  Diagnosis Date  . Anxiety   . Asthma   . Cecal ulcer   . Diabetes mellitus ([place])   . GERD (gastroesophageal reflux disease)   . Hiatal hernia   . Hypercholesterolemia   . Hypertension     Past Surgical History:  Procedure Laterality Date  . TONSILLECTOMY AND ADENOIDECTOMY    . UPPER GASTROINTESTINAL ENDOSCOPY    . WISDOM TOOTH EXTRACTION      Family History  Problem Relation Age of Onset  . Colon polyps Mother   . Colon cancer Maternal Grandmother   . Diabetes Maternal Grandmother   . Heart disease Maternal Grandmother   . Colon polyps Maternal Grandmother   . Breast cancer Paternal Grandmother   . Stomach cancer Paternal Grandmother        mets to stomach  . Prostate cancer Maternal Grandfather   . Esophageal cancer Neg Hx   . Rectal cancer Neg Hx   . Lung cancer Paternal Grandmother     Social History   Social History  . Marital status: Married    Spouse name: N/A  . Number of children: 2  . Years of education: N/A   Occupational History  .  [employer]   Social History Main Topics  . Smoking status: Former Smoker    Types:  Cigarettes, Cigars  . Smokeless tobacco: Never Used  . Alcohol use 0.0 oz/week     Comment: occ  . Drug use: No  . Sexual activity: Not Asked   Other Topics Concern  . None   Social History Narrative  . None     Current Outpatient Prescriptions:  .  albuterol (PROVENTIL HFA;VENTOLIN HFA) 108 (90 BASE) MCG/ACT inhaler, Inhale 2 puffs into the lungs every 6 (six) hours as needed., Disp: 8.5 g, Rfl: 5 .  ALPRAZolam (XANAX) 0.25 MG tablet, Take 1 tablet (0.25 mg total) by mouth daily as needed for sleep or anxiety., Disp: 10 tablet, Rfl: 0 .  amLODipine (NORVASC) 5 MG tablet, Take 1.5 tablets (7.5 mg total) by mouth daily., Disp: 135 tablet, Rfl: 2 .  fenofibrate 54 MG tablet, Take 1 tablet (54 mg total) by mouth daily., Disp: 90 tablet, Rfl: 2 .  losartan (COZAAR) 100 MG tablet, TAKE 1 TABLET (100 MG TOTAL) BY MOUTH DAILY., Disp: 90 tablet, Rfl: 2 .  metFORMIN (GLUCOPHAGE) 500 MG tablet, Take 2 tablets (1,000 mg total) by mouth 2 (two) times daily with a meal., Disp: 360 tablet, Rfl: 2 .  pantoprazole (PROTONIX) 40 MG tablet, Take 1 tablet (40 mg total) by mouth 2 (two) times daily., Disp: 180 tablet, Rfl: 2 .  pravastatin (PRAVACHOL) 40 MG tablet, TAKE 1 TABLET (40 MG TOTAL) BY MOUTH  DAILY., Disp: 90 tablet, Rfl: 2 .  sildenafil (VIAGRA) 100 MG tablet, Take 1 tablet (100 mg total) by mouth daily as needed for erectile dysfunction., Disp: 90 tablet, Rfl: 1  EXAM:  Vitals:   08/30/16 1201  BP: 122/90  Pulse: 79  Temp: 98.3 F (36.8 C)    Body mass index is 34.95 kg/m.  GENERAL: vitals reviewed and listed above, alert, oriented, appears well hydrated and in no acute distress  HEENT: atraumatic, conjunttiva clear, no obvious abnormalities on inspection of external nose and ears  NECK: no obvious masses on inspection  LUNGS: clear to auscultation bilaterally, no wheezes, rales or rhonchi, good air movement  CV: HRRR, no peripheral edema  MS: moves all extremities without  noticeable abnormality  PSYCH: pleasant and cooperative, no obvious depression or anxiety  ASSESSMENT AND PLAN:  Discussed the following assessment and plan:  Type 2 diabetes mellitus without complication, without long-term current use of insulin (HCC) - Plan: Hemoglobin A1c  Hypertension associated with diabetes ([place]) - Plan: Basic metabolic panel, CBC  Hyperlipidemia associated with type 2 diabetes mellitus (HCC)  BMI 34.0-34.9,adult  Abnormal finding on CT scan  Adjustment disorder with anxiety  -labs today -congratulated on lifestyle changes and encouraged to continue a healthy diet and regular exercise -opted to increase norvasc, follow up 1 month for BP -advised assistant to check on status CT and update pt, advised pt to call in 1 week if has not been updated on CT -Patient advised to return or notify a doctor immediately if symptoms worsen or persist or new concerns arise.  Patient Instructions  BEFORE YOU LEAVE: -number to call for him to get the CT scan we ordered -labs -follow up: 1 month for blood pressure  Increase the Norvasc to 7.5 mg daily (1.5 tablets)   We recommend the following healthy lifestyle for LIFE: 1) Small portions.   Tip: eat off of a salad plate instead of a dinner plate.  Tip: It is ok to feel hungry after a meal - that likely means you ate an appropriate portion.  Tip: if you need more or a snack choose fruits, veggies and/or a handful of nuts or seeds.  2) Eat a healthy clean diet.  * Tip: Avoid (less then 1 serving per week): processed foods, sweets, sweetened drinks, white starches (rice, flour, bread, potatoes, pasta, etc), red meat, fast foods, butter  *Tip: CHOOSE instead   * 5-9 servings per day of fresh or frozen fruits and vegetables (but not corn, potatoes, bananas, canned or dried fruit)   *nuts and seeds, beans   *olives and olive oil   *small portions of lean meats such as fish and white chicken    *small portions of whole  grains  3)Get at least 150 minutes of sweaty aerobic exercise per week.  4)Reduce stress - consider counseling, meditation and relaxation to balance other aspects of your life.   We have ordered labs or studies at this visit. It can take up to 1-2 weeks for results and processing. IF results require follow up or explanation, we will call you with instructions. Clinically stable results will be released to your [facility]. If you have not heard from [place] or cannot find your results in [facility] in 2 weeks please contact our office at [phone].  If you are not yet signed up for [facility], please consider signing up.           Colin Benton R., DO

## 2016-08-30 NOTE — Patient Instructions (Signed)
BEFORE YOU LEAVE: -number to call for him to get the CT scan we ordered -labs -follow up: 1 month for blood pressure  Increase the Norvasc to 7.5 mg daily (1.5 tablets)   We recommend the following healthy lifestyle for LIFE: 1) Small portions.   Tip: eat off of a salad plate instead of a dinner plate.  Tip: It is ok to feel hungry after a meal - that likely means you ate an appropriate portion.  Tip: if you need more or a snack choose fruits, veggies and/or a handful of nuts or seeds.  2) Eat a healthy clean diet.  * Tip: Avoid (less then 1 serving per week): processed foods, sweets, sweetened drinks, white starches (rice, flour, bread, potatoes, pasta, etc), red meat, fast foods, butter  *Tip: CHOOSE instead   * 5-9 servings per day of fresh or frozen fruits and vegetables (but not corn, potatoes, bananas, canned or dried fruit)   *nuts and seeds, beans   *olives and olive oil   *small portions of lean meats such as fish and white chicken    *small portions of whole grains  3)Get at least 150 minutes of sweaty aerobic exercise per week.  4)Reduce stress - consider counseling, meditation and relaxation to balance other aspects of your life.   We have ordered labs or studies at this visit. It can take up to 1-2 weeks for results and processing. IF results require follow up or explanation, we will call you with instructions. Clinically stable results will be released to your [facility]. If you have not heard from [place] or cannot find your results in [facility] in 2 weeks please contact our office at [phone].  If you are not yet signed up for [facility], please consider signing up.

## 2016-08-30 NOTE — Telephone Encounter (Signed)
Deborah-Could you please check on the referral for the pt that was entered on 1/4 for a CT chest?  Patient was seen today and stated he has not heard from anyone and questioned what to do?

## 2016-09-09 ENCOUNTER — Inpatient Hospital Stay: Admission: RE | Admit: 2016-09-09 | Payer: 59 | Source: Ambulatory Visit

## 2016-09-10 ENCOUNTER — Other Ambulatory Visit: Payer: Self-pay | Admitting: Family Medicine

## 2016-09-12 ENCOUNTER — Encounter: Payer: Self-pay | Admitting: Family Medicine

## 2016-09-12 MED ORDER — SILDENAFIL CITRATE 20 MG PO TABS: ORAL_TABLET | ORAL | 1 refills | Status: DC

## 2016-09-12 NOTE — Telephone Encounter (Signed)
Peer to Peer completed. His insurance will not cover more then 1 year of stability on pulm lesion < 72mm if asymptomatic. They advised we let pt know this and he can pay out of pocket for the imaging if he wishes, but his plan does not cover further imaging. Insurance company doctor reviewed reports from 2015, 2016 and 2017 and said the their guidelines are very clear on this case so they will not pay. Discussed with pt. He opted not to do at this time. Agrees to let us know if any symptoms. Requests viagra refill with 20mg  tablets as is cheaper. [facility] requested.

## 2016-09-14 ENCOUNTER — Other Ambulatory Visit: Payer: 59

## 2016-10-03 NOTE — Progress Notes (Deleted)
HPI:  Follow up HTN. Increased norvasc to 7.5 mg last visit.  Diabetes lab was creeping up on recent check.  ROS: See pertinent positives and negatives per HPI.  Past Medical History:  Diagnosis Date  . Anxiety   . Asthma   . Cecal ulcer   . Diabetes mellitus ([place])   . GERD (gastroesophageal reflux disease)   . Hiatal hernia   . Hypercholesterolemia   . Hypertension     Past Surgical History:  Procedure Laterality Date  . TONSILLECTOMY AND ADENOIDECTOMY    . UPPER GASTROINTESTINAL ENDOSCOPY    . WISDOM TOOTH EXTRACTION      Family History  Problem Relation Age of Onset  . Colon polyps Mother   . Colon cancer Maternal Grandmother   . Diabetes Maternal Grandmother   . Heart disease Maternal Grandmother   . Colon polyps Maternal Grandmother   . Breast cancer Paternal Grandmother   . Stomach cancer Paternal Grandmother        mets to stomach  . Prostate cancer Maternal Grandfather   . Esophageal cancer Neg Hx   . Rectal cancer Neg Hx   . Lung cancer Paternal Grandmother     Social History   Social History  . Marital status: Married    Spouse name: N/A  . Number of children: 2  . Years of education: N/A   Occupational History  .  [employer]   Social History Main Topics  . Smoking status: Former Smoker    Types: Cigarettes, Cigars  . Smokeless tobacco: Never Used  . Alcohol use 0.0 oz/week     Comment: occ  . Drug use: No  . Sexual activity: Not on file   Other Topics Concern  . Not on file   Social History Narrative  . No narrative on file     Current Outpatient Prescriptions:  .  albuterol (PROVENTIL HFA;VENTOLIN HFA) 108 (90 BASE) MCG/ACT inhaler, Inhale 2 puffs into the lungs every 6 (six) hours as needed., Disp: 8.5 g, Rfl: 5 .  ALPRAZolam (XANAX) 0.25 MG tablet, Take 1 tablet (0.25 mg total) by mouth daily as needed for sleep or anxiety., Disp: 10 tablet, Rfl: 0 .  amLODipine (NORVASC) 5 MG tablet, Take 1.5 tablets (7.5 mg total) by mouth  daily., Disp: 135 tablet, Rfl: 2 .  fenofibrate 54 MG tablet, Take 1 tablet (54 mg total) by mouth daily., Disp: 90 tablet, Rfl: 2 .  losartan (COZAAR) 100 MG tablet, TAKE 1 TABLET (100 MG TOTAL) BY MOUTH DAILY., Disp: 90 tablet, Rfl: 2 .  metFORMIN (GLUCOPHAGE) 500 MG tablet, Take 2 tablets (1,000 mg total) by mouth 2 (two) times daily with a meal., Disp: 360 tablet, Rfl: 2 .  pantoprazole (PROTONIX) 40 MG tablet, Take 1 tablet (40 mg total) by mouth 2 (two) times daily., Disp: 180 tablet, Rfl: 2 .  pravastatin (PRAVACHOL) 40 MG tablet, TAKE 1 TABLET (40 MG TOTAL) BY MOUTH DAILY., Disp: 90 tablet, Rfl: 2 .  sildenafil (REVATIO) 20 MG tablet, 3-5 tabs as needed prior to sexual intercourse. Do not take more then one dose in 24 hours., Disp: 90 tablet, Rfl: 1 .  sildenafil (VIAGRA) 100 MG tablet, Take 1 tablet (100 mg total) by mouth daily as needed for erectile dysfunction., Disp: 90 tablet, Rfl: 1  EXAM:  There were no vitals filed for this visit.  There is no height or weight on file to calculate BMI.  GENERAL: vitals reviewed and listed above, alert, oriented, appears well  hydrated and in no acute distress  HEENT: atraumatic, conjunttiva clear, no obvious abnormalities on inspection of external nose and ears  NECK: no obvious masses on inspection  LUNGS: clear to auscultation bilaterally, no wheezes, rales or rhonchi, good air movement  CV: HRRR, no peripheral edema  MS: moves all extremities without noticeable abnormality  PSYCH: pleasant and cooperative, no obvious depression or anxiety  ASSESSMENT AND PLAN:  Discussed the following assessment and plan:  No diagnosis found.  -Patient advised to return or notify a doctor immediately if symptoms worsen or persist or new concerns arise.  There are no Patient Instructions on file for this visit.  Colin Benton R., DO

## 2016-10-04 ENCOUNTER — Telehealth: Payer: Self-pay | Admitting: *Deleted

## 2016-10-04 ENCOUNTER — Ambulatory Visit: Payer: 59 | Admitting: Family Medicine

## 2016-10-04 DIAGNOSIS — Z0289 Encounter for other administrative examinations: Secondary | ICD-10-CM

## 2016-10-04 NOTE — Telephone Encounter (Signed)
Patient was a no-show for today's appt.  I left a message to check on the pt and asked that he call back to reschedule.

## 2016-10-27 IMAGING — CT CT ABD-PELV W/ CM
3 of 5 series · 12 of 36 positions shown, 18 images · IV contrast (READICAT/WATER & [ID] OMNI 300)
Comparison: 04/02/2014 ultrasound. 03/31/2014 plain film exam. No
comparison CT.

CLINICAL DATA: 43-year-old diabetic hypertensive male with history
of high cholesterol, gastroesophageal reflux, hiatal hernia and
cecal ulcer presenting with epigastric pain and pressure. Symptoms
started 1 day after endoscopies and colonoscopies (03/28/2014.
Diarrhea. Subsequent encounter.

EXAM:
CT ABDOMEN AND PELVIS WITH CONTRAST
TECHNIQUE: Multidetector CT imaging of the abdomen and pelvis was performed
using the standard protocol following bolus administration of
intravenous contrast.
CONTRAST:  125mL OMNIPAQUE IOHEXOL 300 MG/ML  SOLN

[Series 3: abd/pelvis with · axial · 0.92mm/px · z∈[-436,-51]mm · 8 of 100 slices shown, 13 images]
[im 12/100  soft-tissue]
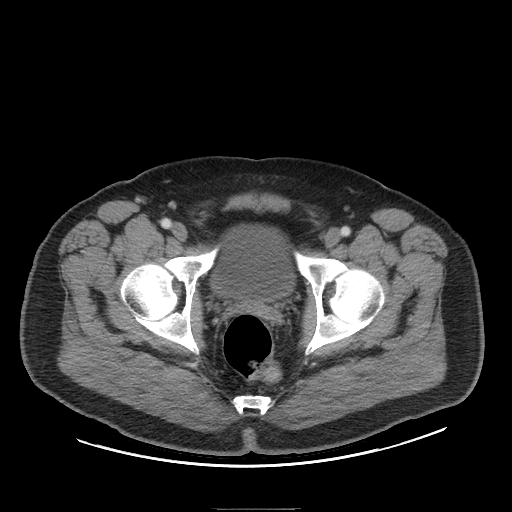
[im 12/100  bone]
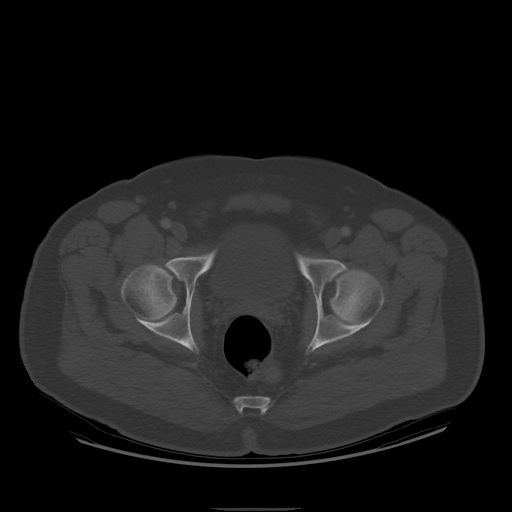
[im 23/100  soft-tissue]
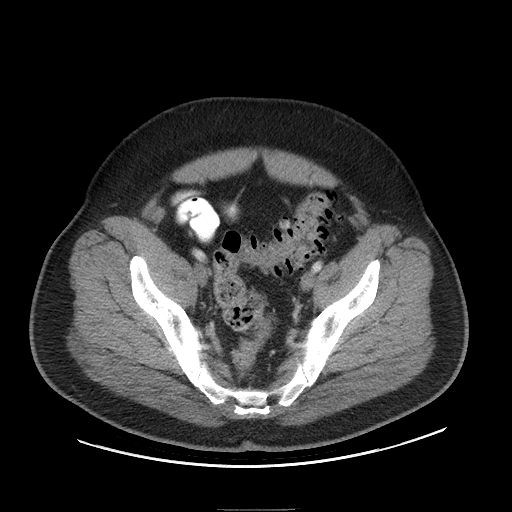
[im 34/100  soft-tissue]
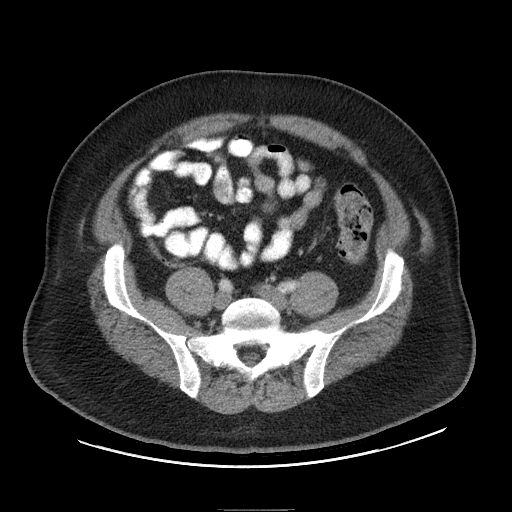
[im 45/100  soft-tissue]
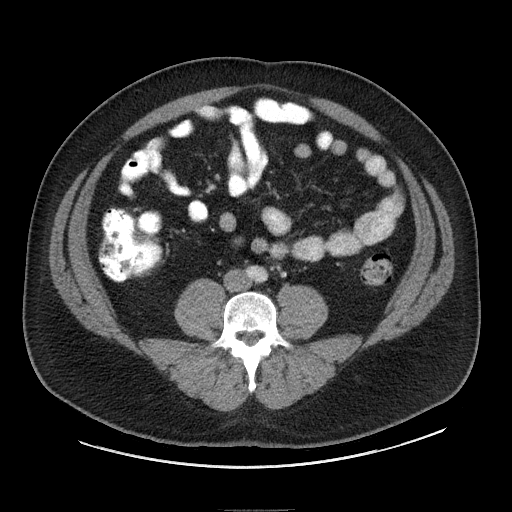
[im 56/100  soft-tissue]
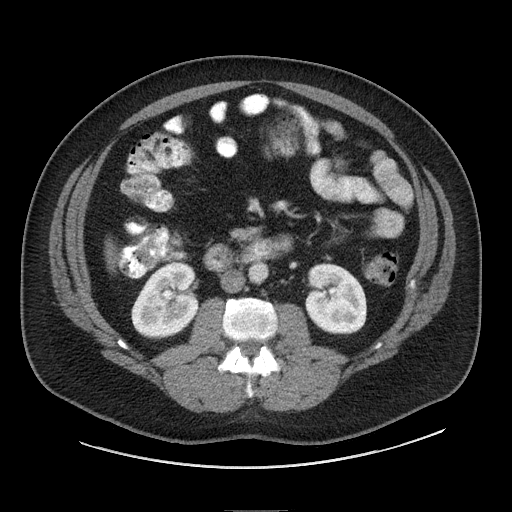
[im 56/100  lung]
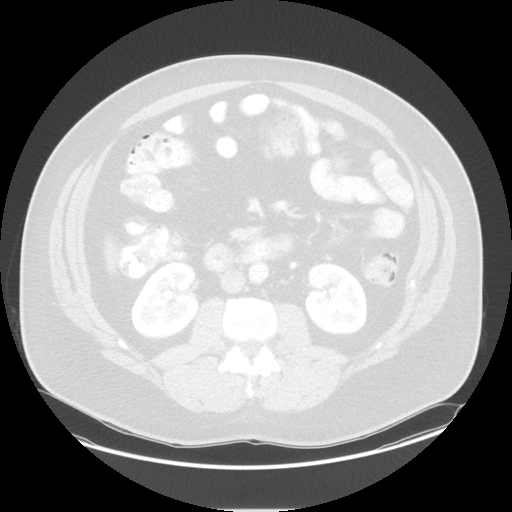
[im 67/100  soft-tissue]
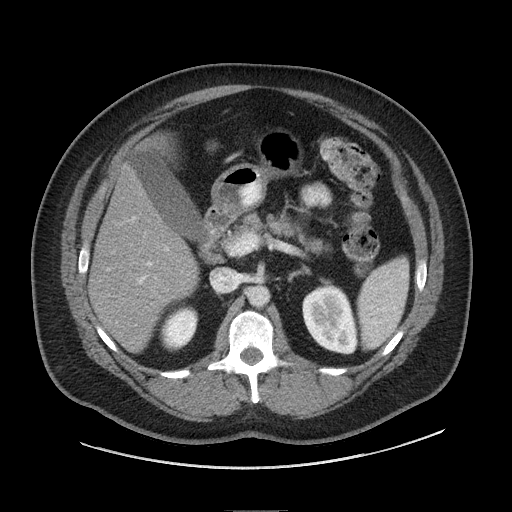
[im 67/100  lung]
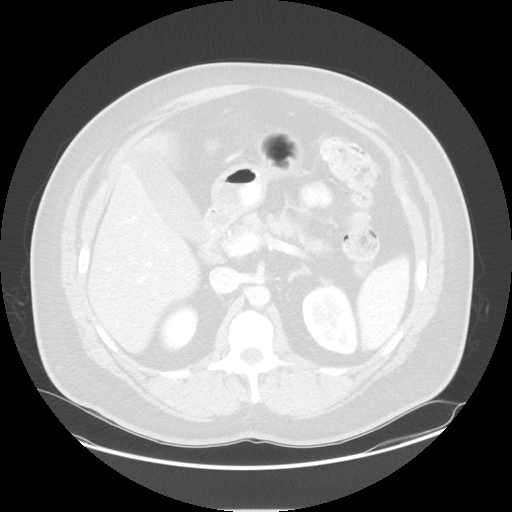
[im 78/100  soft-tissue]
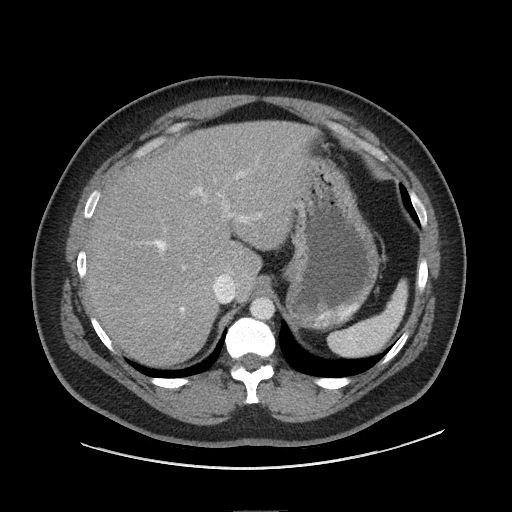
[im 78/100  lung]
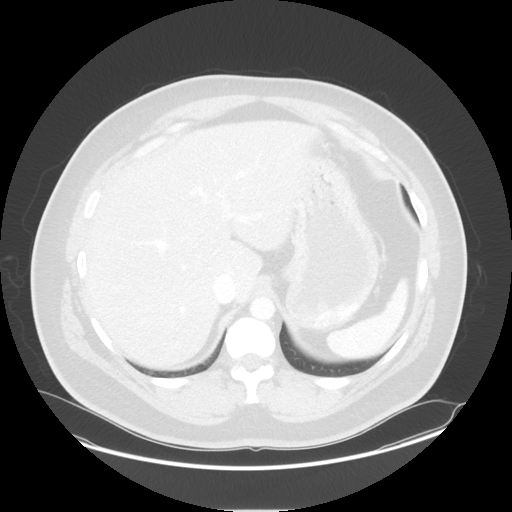
[im 89/100  soft-tissue]
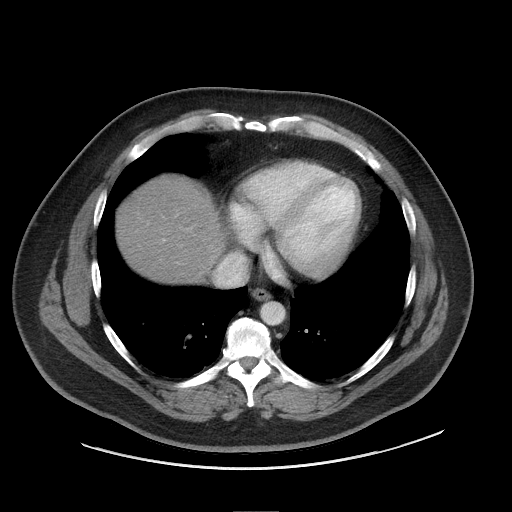
[im 89/100  lung]
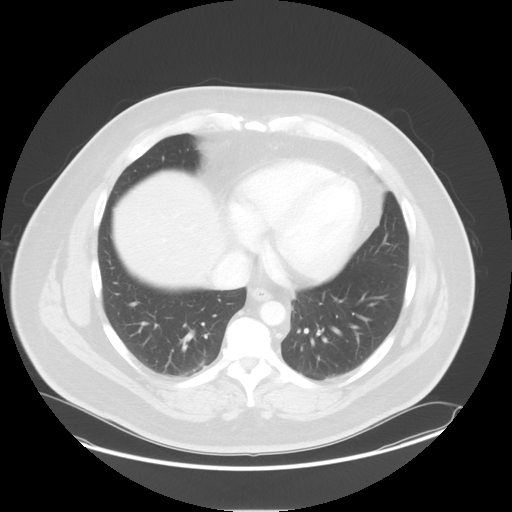

[Series 601: coronal body · coronal · 0.98mm/px · 1 of 153 slices shown, 2 images]
[im 51/153  soft-tissue]
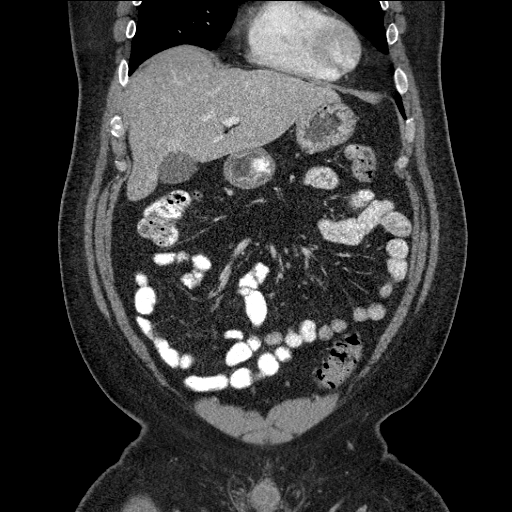
[im 51/153  bone]
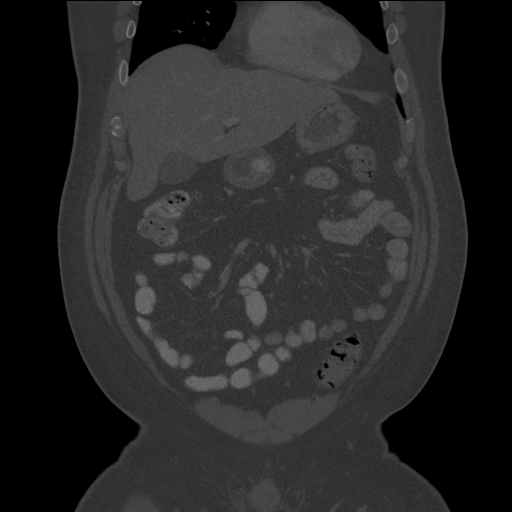

[Series 602: sagittal body · sagittal · 0.98mm/px · 3 of 183 slices shown]
[im 12/183  soft-tissue]
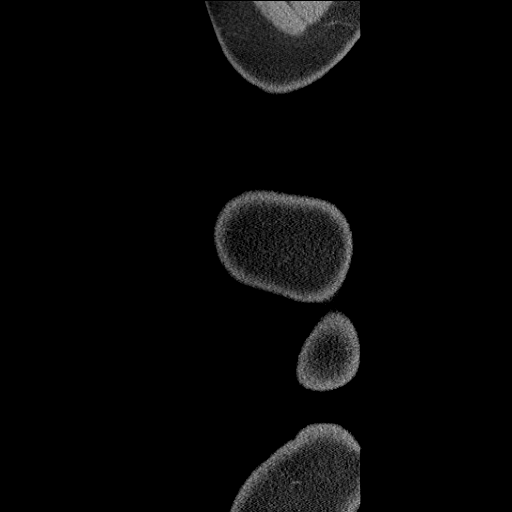
[im 35/183  soft-tissue]
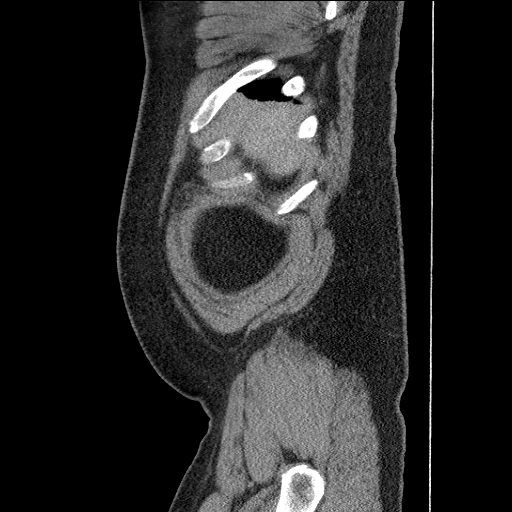
[im 57/183  soft-tissue]
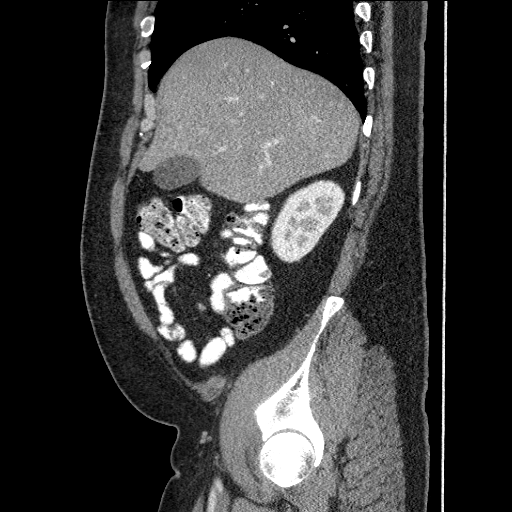

[12 of 36 positions shown; findings below may reference images not displayed]

FINDINGS: Sigmoid diverticula. No extra luminal bowel inflammatory process,
free fluid or free air noted to suggest presence of diverticulitis
or postprocedure complication. No inflammation surrounds the
appendix.

Right middle lobe 5.5 x 3.3 mm noncalcified nodule (series 4, image
5). If the patient is at high risk for bronchogenic carcinoma,
follow-up chest CT at 6-12 months is recommended. If the patient is
at low risk for bronchogenic carcinoma, follow-up chest CT at 12
months is recommended. This recommendation follows the consensus
statement: Guidelines for Management of Small Pulmonary Nodules
Detected on CT Scans: A Statement from the [HOSPITAL] as
published in Radiology 9004;[DATE].

Right femoral head avascular necrosis without collapse.

Fatty liver without focal worrisome mass.  No calcified gallstones.

Focal fat collection adjacent to the pancreatic head/ uncinate
process most likely an incidental finding without worrisome
pancreatic mass identified.

No splenic, renal or adrenal abnormality.

Heart size top-normal. No abdominal aortic aneurysm. Major branch
vessels are patent.

Slightly prominent portacaval lymph node with short axis dimension
of 13.9 mm. No other adenopathy noted.

Noncontrast filled views of the urinary bladder unremarkable.
Prostate gland does not appear enlarged.

Mild degenerative changes lower lumbar spine
IMPRESSION: Sigmoid diverticulosis. No extra luminal bowel inflammatory process,
free fluid or free air noted to suggest presence of diverticulitis
or postprocedure complication. No inflammation surrounds the
appendix.

Right middle lobe 5.5 x 3.3 mm noncalcified nodule (series 4, image
5). Follow-up as noted above.

Right femoral head avascular necrosis without collapse.

Fatty liver.

Focal fat collection adjacent to the pancreatic head/ uncinate
process most likely an incidental finding without worrisome
pancreatic mass identified.

Slightly prominent portacaval lymph node with short axis dimension
of 13.9 mm. No other adenopathy noted.

This is a call report.

## 2016-12-29 ENCOUNTER — Encounter: Payer: Self-pay | Admitting: Family Medicine

## 2017-02-27 NOTE — Progress Notes (Signed)
HPI:  Michael Jacobs is a pleasant 46 y.o. here for follow up. Chronic medical problems summarized below were reviewed for changes. Doing well. Not eating as healthy and not exercising. Drank alcohol last night - but avoiding for the most part. Plans to get back on rrack with lifestyle. Ran out of BP med a few days ago. Denies CP, SOB, DOE, treatment intolerance or new symptoms. Due for flu shot, foot exam, labs  DM: -meds: metformin 1000bid, arb, statin  HTN: -meds: amlodipine 5, losartan 100  HLD/Obesity/Fatty liver: -meds fenofibrate, pravastatin  ED: -meds: viagra  Anxiety, focus issues and Depression: -seeing psychiatrist for management -meds: adderall, xanax  ROS: See pertinent positives and negatives per HPI.  Past Medical History:  Diagnosis Date  . Anxiety   . Asthma   . Cecal ulcer   . Diabetes mellitus ([place])   . GERD (gastroesophageal reflux disease)   . Hiatal hernia   . Hypercholesterolemia   . Hypertension     Past Surgical History:  Procedure Laterality Date  . TONSILLECTOMY AND ADENOIDECTOMY    . UPPER GASTROINTESTINAL ENDOSCOPY    . WISDOM TOOTH EXTRACTION      Family History  Problem Relation Age of Onset  . Colon polyps Mother   . Colon cancer Maternal Grandmother   . Diabetes Maternal Grandmother   . Heart disease Maternal Grandmother   . Colon polyps Maternal Grandmother   . Breast cancer Paternal Grandmother   . Stomach cancer Paternal Grandmother        mets to stomach  . Prostate cancer Maternal Grandfather   . Esophageal cancer Neg Hx   . Rectal cancer Neg Hx   . Lung cancer Paternal Grandmother     Social History   Socioeconomic History  . Marital status: Married    Spouse name: None  . Number of children: 2  . Years of education: None  . Highest education level: None  Social Needs  . Financial resource strain: None  . Food insecurity - worry: None  . Food insecurity - inability: None  . Transportation needs -  medical: None  . Transportation needs - non-medical: None  Occupational History    Employer: [employer]  Tobacco Use  . Smoking status: Former Smoker    Types: Cigarettes, Cigars  . Smokeless tobacco: Never Used  Substance and Sexual Activity  . Alcohol use: Yes    Alcohol/week: 0.0 oz    Comment: occ  . Drug use: No  . Sexual activity: None  Other Topics Concern  . None  Social History Narrative  . None     Current Outpatient Medications:  .  albuterol (PROVENTIL HFA;VENTOLIN HFA) 108 (90 BASE) MCG/ACT inhaler, Inhale 2 puffs into the lungs every 6 (six) hours as needed., Disp: 8.5 g, Rfl: 5 .  ALPRAZolam (XANAX) 0.25 MG tablet, Take 1 tablet (0.25 mg total) by mouth daily as needed for sleep or anxiety., Disp: 10 tablet, Rfl: 0 .  amLODipine (NORVASC) 5 MG tablet, Take 1.5 tablets (7.5 mg total) by mouth daily., Disp: 135 tablet, Rfl: 3 .  fenofibrate 54 MG tablet, Take 1 tablet (54 mg total) by mouth daily., Disp: 90 tablet, Rfl: 2 .  losartan (COZAAR) 100 MG tablet, TAKE 1 TABLET (100 MG TOTAL) BY MOUTH DAILY., Disp: 90 tablet, Rfl: 3 .  metFORMIN (GLUCOPHAGE) 500 MG tablet, Take 2 tablets (1,000 mg total) by mouth 2 (two) times daily with a meal., Disp: 360 tablet, Rfl: 2 .  pantoprazole (  PROTONIX) 40 MG tablet, Take 1 tablet (40 mg total) by mouth 2 (two) times daily., Disp: 180 tablet, Rfl: 2 .  pravastatin (PRAVACHOL) 40 MG tablet, TAKE 1 TABLET (40 MG TOTAL) BY MOUTH DAILY., Disp: 90 tablet, Rfl: 2 .  sildenafil (VIAGRA) 25 MG tablet, 1-4 tablets as needed for sexual dysfunction. Do not take more the 1 dose /24hrs., Disp: 90 tablet, Rfl: 0  EXAM:  Vitals:   02/28/17 1133  BP: (!) 120/92  Pulse: 99  Temp: 98 F (36.7 C)    Body mass index is 35.77 kg/m.  GENERAL: vitals reviewed and listed above, alert, oriented, appears well hydrated and in no acute distress  HEENT: atraumatic, conjunttiva clear, no obvious abnormalities on inspection of external nose and  ears  NECK: no obvious masses on inspection  LUNGS: clear to auscultation bilaterally, no wheezes, rales or rhonchi, good air movement  CV: HRRR, no peripheral edema  MS: moves all extremities without noticeable abnormality  PSYCH: pleasant and cooperative, no obvious depression or anxiety  ASSESSMENT AND PLAN:  Discussed the following assessment and plan:  Type 2 diabetes mellitus without complication, without long-term current use of insulin (HCC) - Plan: Hemoglobin A1c  Hypertension associated with diabetes ([place]) - Plan: Basic metabolic panel, Comprehensive metabolic panel  Hyperlipidemia associated with type 2 diabetes mellitus ([place])  Fatty liver - Plan: Comprehensive metabolic panel  Morbid obesity ([place])  Need for immunization against influenza - Plan: Flu Vaccine QUAD 6+ mos PF IM (Fluarix Quad PF)  -lifestyle recs -refills -labs -flu shot -foot exam -f/u 3-4 months -Patient advised to return or notify a doctor immediately if symptoms worsen or persist or new concerns arise.  Patient Instructions  BEFORE YOU LEAVE: -Flu shot -Labs -follow up: 3-4 months  Take all medications daily.  We have ordered labs or studies at this visit. It can take up to 1-2 weeks for results and processing. IF results require follow up or explanation, we will call you with instructions. Clinically stable results will be released to your [facility]. If you have not heard from [place] or cannot find your results in [facility] in 2 weeks please contact our office at [phone].  If you are not yet signed up for [facility], please consider signing up.          We recommend the following healthy lifestyle for LIFE: 1) Small portions. But, make sure to get regular (at least 3 per day), healthy meals and small healthy snacks if needed.  2) Eat a healthy clean diet.   TRY TO EAT: -at least 5-7 servings of low sugar, colorful, and nutrient rich vegetables per day (not corn, potatoes or  bananas.) -berries are the best choice if you wish to eat fruit (only eat small amounts if trying to reduce weight)  -lean meets (fish, white meat of chicken or [place]) -vegan proteins for some meals - beans or tofu, whole grains, nuts and seeds -Replace bad fats with good fats - good fats include: fish, nuts and seeds, canola oil, olive oil -small amounts of low fat or non fat dairy -small amounts of100 % whole grains - check the lables -drink plenty of water  AVOID: -SUGAR, sweets, anything with added sugar, corn syrup or sweeteners - must read labels as even foods advertised as "healthy" often are loaded with sugar -if you must have a sweetener, small amounts of stevia may be best -sweetened beverages and artificially sweetened beverages -simple starches (rice, bread, potatoes, pasta, chips, etc - small  amounts of 100% whole grains are ok) -red meat, pork, butter -fried foods, fast food, processed food, excessive dairy, eggs and coconut.  3)Get at least 150 minutes of sweaty aerobic exercise per week.  4)Reduce stress - consider counseling, meditation and relaxation to balance other aspects of your life.   WE NOW OFFER   [facility] FAST TRACK!!!  SAME DAY Appointments for ACUTE CARE  Such as: Sprains, Injuries, cuts, abrasions, rashes, muscle pain, joint pain, back pain Colds, flu, sore throats, headache, allergies, cough, fever  Ear pain, sinus and eye infections Abdominal pain, nausea, vomiting, diarrhea, upset stomach Animal/insect bites  3 Easy Ways to Schedule: Walk-In Scheduling Call in scheduling Mychart Sign-up: [email or web address]            Colin Benton R., DO

## 2017-02-28 ENCOUNTER — Ambulatory Visit: Payer: 59 | Admitting: Family Medicine

## 2017-02-28 ENCOUNTER — Encounter: Payer: Self-pay | Admitting: Family Medicine

## 2017-02-28 VITALS — BP 120/92 | HR 99 | Temp 98.0°F | Ht 73.0 in | Wt 271.1 lb

## 2017-02-28 DIAGNOSIS — E119 Type 2 diabetes mellitus without complications: Secondary | ICD-10-CM

## 2017-02-28 DIAGNOSIS — I10 Essential (primary) hypertension: Secondary | ICD-10-CM | POA: Diagnosis not present

## 2017-02-28 DIAGNOSIS — K760 Fatty (change of) liver, not elsewhere classified: Secondary | ICD-10-CM | POA: Diagnosis not present

## 2017-02-28 DIAGNOSIS — Z23 Encounter for immunization: Secondary | ICD-10-CM | POA: Diagnosis not present

## 2017-02-28 DIAGNOSIS — E1159 Type 2 diabetes mellitus with other circulatory complications: Secondary | ICD-10-CM

## 2017-02-28 DIAGNOSIS — E785 Hyperlipidemia, unspecified: Secondary | ICD-10-CM | POA: Diagnosis not present

## 2017-02-28 DIAGNOSIS — E1169 Type 2 diabetes mellitus with other specified complication: Secondary | ICD-10-CM | POA: Diagnosis not present

## 2017-02-28 DIAGNOSIS — I152 Hypertension secondary to endocrine disorders: Secondary | ICD-10-CM

## 2017-02-28 MED ORDER — SILDENAFIL CITRATE 25 MG PO TABS: ORAL_TABLET | ORAL | 0 refills | Status: DC

## 2017-02-28 MED ORDER — LOSARTAN POTASSIUM 100 MG PO TABS: ORAL_TABLET | ORAL | 3 refills | Status: DC

## 2017-02-28 MED ORDER — AMLODIPINE BESYLATE 5 MG PO TABS: 7.5000 mg | ORAL_TABLET | Freq: Every day | ORAL | 3 refills | Status: DC

## 2017-02-28 MED ORDER — LOSARTAN POTASSIUM 100 MG PO TABS: ORAL_TABLET | ORAL | 0 refills | Status: DC

## 2017-02-28 MED ORDER — AMLODIPINE BESYLATE 5 MG PO TABS: 7.5000 mg | ORAL_TABLET | Freq: Every day | ORAL | 0 refills | Status: DC

## 2017-02-28 NOTE — Patient Instructions (Signed)
BEFORE YOU LEAVE: -Flu shot -Labs -follow up: 3-4 months  Take all medications daily.  We have ordered labs or studies at this visit. It can take up to 1-2 weeks for results and processing. IF results require follow up or explanation, we will call you with instructions. Clinically stable results will be released to your [facility]. If you have not heard from [place] or cannot find your results in [facility] in 2 weeks please contact our office at [phone].  If you are not yet signed up for [facility], please consider signing up.          We recommend the following healthy lifestyle for LIFE: 1) Small portions. But, make sure to get regular (at least 3 per day), healthy meals and small healthy snacks if needed.  2) Eat a healthy clean diet.   TRY TO EAT: -at least 5-7 servings of low sugar, colorful, and nutrient rich vegetables per day (not corn, potatoes or bananas.) -berries are the best choice if you wish to eat fruit (only eat small amounts if trying to reduce weight)  -lean meets (fish, white meat of chicken or [place]) -vegan proteins for some meals - beans or tofu, whole grains, nuts and seeds -Replace bad fats with good fats - good fats include: fish, nuts and seeds, canola oil, olive oil -small amounts of low fat or non fat dairy -small amounts of100 % whole grains - check the lables -drink plenty of water  AVOID: -SUGAR, sweets, anything with added sugar, corn syrup or sweeteners - must read labels as even foods advertised as "healthy" often are loaded with sugar -if you must have a sweetener, small amounts of stevia may be best -sweetened beverages and artificially sweetened beverages -simple starches (rice, bread, potatoes, pasta, chips, etc - small amounts of 100% whole grains are ok) -red meat, pork, butter -fried foods, fast food, processed food, excessive dairy, eggs and coconut.  3)Get at least 150 minutes of sweaty aerobic exercise per week.  4)Reduce stress -  consider counseling, meditation and relaxation to balance other aspects of your life.   WE NOW OFFER    [facility] FAST TRACK!!!  SAME DAY Appointments for ACUTE CARE  Such as: Sprains, Injuries, cuts, abrasions, rashes, muscle pain, joint pain, back pain Colds, flu, sore throats, headache, allergies, cough, fever  Ear pain, sinus and eye infections Abdominal pain, nausea, vomiting, diarrhea, upset stomach Animal/insect bites  3 Easy Ways to Schedule: Walk-In Scheduling Call in scheduling Mychart Sign-up: [email or web address]

## 2017-03-06 ENCOUNTER — Encounter: Payer: Self-pay | Admitting: Family Medicine

## 2017-03-07 ENCOUNTER — Other Ambulatory Visit: Payer: Self-pay | Admitting: *Deleted

## 2017-03-07 MED ORDER — PRAVASTATIN SODIUM 40 MG PO TABS: ORAL_TABLET | ORAL | 3 refills | Status: DC

## 2017-03-07 MED ORDER — SILDENAFIL CITRATE 20 MG PO TABS: 20.0000 mg | ORAL_TABLET | Freq: Three times a day (TID) | ORAL | 0 refills | Status: DC

## 2017-03-07 MED ORDER — PANTOPRAZOLE SODIUM 40 MG PO TBEC: 40.0000 mg | DELAYED_RELEASE_TABLET | Freq: Two times a day (BID) | ORAL | 3 refills | Status: DC

## 2017-03-07 MED ORDER — FENOFIBRATE 54 MG PO TABS: 54.0000 mg | ORAL_TABLET | Freq: Every day | ORAL | 3 refills | Status: DC

## 2017-03-07 MED ORDER — LOSARTAN POTASSIUM 100 MG PO TABS: ORAL_TABLET | ORAL | 3 refills | Status: DC

## 2017-03-07 MED ORDER — METFORMIN HCL 500 MG PO TABS: 1000.0000 mg | ORAL_TABLET | Freq: Two times a day (BID) | ORAL | 3 refills | Status: DC

## 2017-03-07 MED ORDER — AMLODIPINE BESYLATE 5 MG PO TABS: 7.5000 mg | ORAL_TABLET | Freq: Every day | ORAL | 3 refills | Status: DC

## 2017-03-08 ENCOUNTER — Other Ambulatory Visit: Payer: 59

## 2017-03-08 LAB — BASIC METABOLIC PANEL
BUN: 16 mg/dL (ref 6–23)
CHLORIDE: 103 meq/L (ref 96–112)
CO2: 26 meq/L (ref 19–32)
Calcium: 10 mg/dL (ref 8.4–10.5)
Creatinine, Ser: 0.99 mg/dL (ref 0.40–1.50)
GFR: 87.09 mL/min (ref >60.00)
Glucose, Bld: 173 mg/dL — ABNORMAL HIGH (ref 70–99)
POTASSIUM: 4.2 meq/L (ref 3.5–5.1)
SODIUM: 138 meq/L (ref 135–145)

## 2017-03-08 LAB — HEMOGLOBIN A1C: Hgb A1c MFr Bld: 7.1 % — ABNORMAL HIGH (ref 4.6–6.5)

## 2017-03-23 ENCOUNTER — Encounter: Payer: Self-pay | Admitting: *Deleted

## 2017-04-20 ENCOUNTER — Encounter: Payer: Self-pay | Admitting: Family Medicine

## 2017-04-24 ENCOUNTER — Encounter: Payer: Self-pay | Admitting: Family Medicine

## 2017-06-30 ENCOUNTER — Telehealth: Payer: Self-pay | Admitting: *Deleted

## 2017-06-30 NOTE — Telephone Encounter (Signed)
I would recommend defer this decision to Dr Maudie Mercury- unless he will run out over the weekend.

## 2017-06-30 NOTE — Telephone Encounter (Signed)
Michael Jacobs faxed a note stating the pt would like to have Losartan changed due to the recall.  A request for a new Rx for an alternative such as Telmisartan.  Message sent to Dr Elease Hashimoto.

## 2017-06-30 NOTE — Telephone Encounter (Signed)
I called the pt and informed him of the message below.  Patient agreed to having Dr Maudie Mercury address this when she returns to the office on 4/1.

## 2017-07-03 NOTE — Telephone Encounter (Signed)
Most folks are continuing this medication unless their particular pharmacy does not carry a losartan from un-impacted batch or manufacturer. He can check with pharmacy. Thanks. If he still wants to change happy to change but then would nee follow up to check bp 1 month later.

## 2017-07-04 NOTE — Telephone Encounter (Signed)
Unable to leave a message due to voicemail being full. 

## 2017-07-10 NOTE — Telephone Encounter (Signed)
I left a detailed message with the information below.  I asked that the pt call back to let us know if he would like to change to the suggested medication and if so he would need a follow up visit 1 month later.

## 2017-10-16 ENCOUNTER — Other Ambulatory Visit: Payer: Self-pay | Admitting: Family Medicine

## 2018-01-06 ENCOUNTER — Other Ambulatory Visit: Payer: Self-pay | Admitting: Family Medicine

## 2018-03-13 DIAGNOSIS — F419 Anxiety disorder, unspecified: Secondary | ICD-10-CM | POA: Diagnosis not present

## 2018-03-13 DIAGNOSIS — Z79899 Other long term (current) drug therapy: Secondary | ICD-10-CM | POA: Diagnosis not present

## 2018-03-13 DIAGNOSIS — F902 Attention-deficit hyperactivity disorder, combined type: Secondary | ICD-10-CM | POA: Diagnosis not present

## 2018-06-01 ENCOUNTER — Other Ambulatory Visit: Payer: Self-pay | Admitting: Family Medicine

## 2018-06-05 ENCOUNTER — Other Ambulatory Visit: Payer: Self-pay | Admitting: Family Medicine

## 2018-06-09 ENCOUNTER — Other Ambulatory Visit: Payer: Self-pay | Admitting: Family Medicine

## 2018-06-24 ENCOUNTER — Other Ambulatory Visit: Payer: Self-pay | Admitting: Family Medicine

## 2018-06-29 ENCOUNTER — Telehealth: Payer: Self-pay | Admitting: Family Medicine

## 2018-06-29 NOTE — Telephone Encounter (Signed)
Copied from [place] [id number]. Topic: Quick Communication - Rx Refill/Question >> Jun 29, 2018  1:48 PM Blase Mess A wrote: Medication: metFORMIN (GLUCOPHAGE) 500 MG tablet [[id number]] only given 30 pills  Normally given 4 bottles -120pills  losartan (COZAAR) 100 MG tablet [[id number]] only given 4 pills  Appt scheduled 07/23/18  Has the patient contacted their pharmacy? Yes  (Agent: If no, request that the patient contact the pharmacy for the refill.) (Agent: If yes, when and what did the pharmacy advise?)  Preferred Pharmacy (with phone number or street name): [name] [address] [place] - [address] [phone] (Phone) [phone] (Fax)    Agent: Please be advised that RX refills may take up to 3 business days. We ask that you follow-up with your pharmacy.

## 2018-07-02 MED ORDER — LOSARTAN POTASSIUM 100 MG PO TABS: ORAL_TABLET | ORAL | 0 refills | Status: DC

## 2018-07-02 MED ORDER — METFORMIN HCL 500 MG PO TABS: ORAL_TABLET | ORAL | 0 refills | Status: DC

## 2018-07-02 NOTE — Telephone Encounter (Signed)
Rx done. 

## 2018-07-04 NOTE — Telephone Encounter (Signed)
Pt called in to make provider aware that he did not receive his complete Rx for his metformin pt says that he is coming in on 4/13 and is currently taking 2 tablets 2 times daily. Pt would like assistance with receiving his complete Rx until his apt if possible.  Please advise and assist.

## 2018-07-09 NOTE — Telephone Encounter (Signed)
Unable to leave a message due to voicemail being full. 

## 2018-07-11 ENCOUNTER — Encounter: Payer: Self-pay | Admitting: Family Medicine

## 2018-07-17 MED ORDER — METFORMIN HCL 500 MG PO TABS: ORAL_TABLET | ORAL | 0 refills | Status: DC

## 2018-07-17 NOTE — Telephone Encounter (Signed)
See Mychart note 

## 2018-07-23 ENCOUNTER — Ambulatory Visit (INDEPENDENT_AMBULATORY_CARE_PROVIDER_SITE_OTHER): Payer: BLUE CROSS/BLUE SHIELD | Admitting: Family Medicine

## 2018-07-23 ENCOUNTER — Telehealth: Payer: Self-pay | Admitting: *Deleted

## 2018-07-23 ENCOUNTER — Other Ambulatory Visit: Payer: Self-pay

## 2018-07-23 DIAGNOSIS — R911 Solitary pulmonary nodule: Secondary | ICD-10-CM | POA: Diagnosis not present

## 2018-07-23 DIAGNOSIS — E7849 Other hyperlipidemia: Secondary | ICD-10-CM

## 2018-07-23 DIAGNOSIS — E119 Type 2 diabetes mellitus without complications: Secondary | ICD-10-CM

## 2018-07-23 DIAGNOSIS — J4520 Mild intermittent asthma, uncomplicated: Secondary | ICD-10-CM

## 2018-07-23 DIAGNOSIS — I10 Essential (primary) hypertension: Secondary | ICD-10-CM | POA: Diagnosis not present

## 2018-07-23 MED ORDER — LOSARTAN POTASSIUM 100 MG PO TABS: ORAL_TABLET | ORAL | 0 refills | Status: DC

## 2018-07-23 MED ORDER — METFORMIN HCL 1000 MG PO TABS: ORAL_TABLET | ORAL | 0 refills | Status: DC

## 2018-07-23 MED ORDER — FENOFIBRATE 54 MG PO TABS: 54.0000 mg | ORAL_TABLET | Freq: Every day | ORAL | 0 refills | Status: DC

## 2018-07-23 MED ORDER — PRAVASTATIN SODIUM 40 MG PO TABS: ORAL_TABLET | ORAL | 0 refills | Status: DC

## 2018-07-23 MED ORDER — AMLODIPINE BESYLATE 5 MG PO TABS: ORAL_TABLET | ORAL | 0 refills | Status: DC

## 2018-07-23 NOTE — Progress Notes (Signed)
Virtual Visit via Video Note  I connected with [name]  on 07/23/18 at 10:30 AM EDT by a video enabled telemedicine application and verified that I am speaking with the correct person using two identifiers.  Location patient: home Location provider:work or home office Persons participating in the virtual visit: patient, provider  I discussed the limitations of evaluation and management by telemedicine and the availability of in person appointments. The patient expressed understanding and agreed to proceed.   HPI:  Michael Jacobs is a pleasant 48 y.o. here for follow up. Chronic medical problems summarized below were reviewed for changes and stability and were updated as needed below. These issues and their treatment remain stable for the most part.  Reports started exercising more and is doing a low carb low sugar diet and has lost about 25 lbs. Unfortunately, gained som back the last few weeks as has not been exercising as much. Has been staying home/working for home. BS in the 90s fasting. Denies CP, SOB, DOE, treatment intolerance or new symptoms.  DM: -meds: metformin 1000bid, arb, statin  HTN: -meds: amlodipine 5, losartan 100  HLD/Obesity/Fatty liver: -meds fenofibrate, pravastatin  ED: -meds: viagra  Anxiety, focus issues and Depression: -seeing psychiatrist for management -meds: adderall, xanax  Has hx of mild asthma and pulm nodules: -his insurance refused a follow up CT for the nodules as they felt was not needed - this was even with me talking with the insurance company physician -he would like to readdress this once establishes with new doctor and COVID19 pandemic is better as would like to do -requests refill on alb to have as sometimes has symptoms with spring allergies  ROS: See pertinent positives and negatives per HPI.  Past Medical History:  Diagnosis Date  . Anxiety   . Asthma   . Cecal ulcer   . Diabetes mellitus ([place])   . GERD (gastroesophageal  reflux disease)   . Hiatal hernia   . Hypercholesterolemia   . Hypertension     Past Surgical History:  Procedure Laterality Date  . TONSILLECTOMY AND ADENOIDECTOMY    . UPPER GASTROINTESTINAL ENDOSCOPY    . WISDOM TOOTH EXTRACTION      Family History  Problem Relation Age of Onset  . Colon polyps Mother   . Colon cancer Maternal Grandmother   . Diabetes Maternal Grandmother   . Heart disease Maternal Grandmother   . Colon polyps Maternal Grandmother   . Breast cancer Paternal Grandmother   . Stomach cancer Paternal Grandmother        mets to stomach  . Prostate cancer Maternal Grandfather   . Esophageal cancer Neg Hx   . Rectal cancer Neg Hx   . Lung cancer Paternal Grandmother     SOCIAL HX: see hpi   Current Outpatient Medications:  .  albuterol (PROVENTIL HFA;VENTOLIN HFA) 108 (90 BASE) MCG/ACT inhaler, Inhale 2 puffs into the lungs every 6 (six) hours as needed., Disp: 8.5 g, Rfl: 5 .  ALPRAZolam (XANAX) 0.25 MG tablet, Take 1 tablet (0.25 mg total) by mouth daily as needed for sleep or anxiety., Disp: 10 tablet, Rfl: 0 .  amLODipine (NORVASC) 5 MG tablet, TAKE ONE AND ONE-HALF (1 & 1/2) TABLET BY MOUTH DAILY, Disp: 135 tablet, Rfl: 0 .  fenofibrate 54 MG tablet, Take 1 tablet (54 mg total) by mouth daily., Disp: 90 tablet, Rfl: 0 .  losartan (COZAAR) 100 MG tablet, TAKE ONE TABLET BY MOUTH DAILY, Disp: 30 tablet, Rfl: 0 .  metFORMIN (  GLUCOPHAGE) 1000 MG tablet, TAKE ONE Tablet BY MOUTH TWICE A DAY WITH A MEAL, Disp: 120 tablet, Rfl: 0 .  pantoprazole (PROTONIX) 40 MG tablet, Take 1 tablet (40 mg total) by mouth 2 (two) times daily., Disp: 180 tablet, Rfl: 3 .  pravastatin (PRAVACHOL) 40 MG tablet, TAKE ONE TABLET BY MOUTH DAILY, Disp: 90 tablet, Rfl: 0 .  sildenafil (REVATIO) 20 MG tablet, Take 1 tablet (20 mg total) by mouth 3 (three) times daily., Disp: 90 tablet, Rfl: 0 .  sildenafil (REVATIO) 20 MG tablet, TAKE 3-5 TABLETS AS NEEDED PRIOR TO SEXUAL INTERCOURSE. DO  NOT TAKE MORE THAN 1 DOSE IN 24 HOURS, Disp: 90 tablet, Rfl: 0 .  sildenafil (REVATIO) 20 MG tablet, TAKE 3 TO 5 TABLETS BY MOUTH AS NEEDED PRIOR TO SEXUAL INTERCOURSE. DO NOT TAKE MORE THAN ONE DOSE IN 24 HOURS, Disp: 30 tablet, Rfl: 0  EXAM:  VITALS per patient if applicable: wt 244, 010/27, P 81  GENERAL: alert, oriented, appears well and in no acute distress  HEENT: atraumatic, conjunttiva clear, no obvious abnormalities on inspection of external nose and ears  NECK: normal movements of the head and neck  LUNGS: on inspection no signs of respiratory distress, breathing rate appears normal, no obvious gross SOB, gasping or wheezing  CV: no obvious cyanosis  MS: moves all visible extremities without noticeable abnormality  PSYCH/NEURO: pleasant and cooperative, no obvious depression or anxiety, speech and thought processing grossly intact  ASSESSMENT AND PLAN:  Discussed the following assessment and plan:  Other hyperlipidemia  Type 2 diabetes mellitus without complication, without long-term current use of insulin (HCC)  Essential hypertension  Pulmonary nodule  Mild intermittent asthma without complication  Congratulated on lifestyle changes and weight reduction! He is due for labs, but requested to avoid the office for now in light of the COVID 19 pandemic. Medications refill. He is monitoring his BP and BS at home. He plans to continue to monitor his BP for now and review the log with his new PCP. He has not taken his blood pressure yet. Plans to get back into exercising.  Will get him a TOC of visit with Dr. Ethlyn Gallery per his preference.    I discussed the assessment and treatment plan with the patient. The patient was provided an opportunity to ask questions and all were answered. The patient agreed with the plan and demonstrated an understanding of the instructions.   The patient was advised to call back or seek an in-person evaluation if the symptoms worsen or if the  condition fails to improve as anticipated.    Follow up instructions: Advised assistant Wendie Simmer to help patient arrange the following: -TOC with Dr. Ethlyn Gallery in about 2 weeks -Please have Sheena call him about a bill they got that they are not sure what it is about  Lucretia Kern, DO

## 2018-07-23 NOTE — Telephone Encounter (Signed)
Attempted to contact pt. VM is full so unable to leave message.   Per account there is no outstanding balance or recent bills. Pt has not been seen in this office since 2018, until today. Not sure what he is referencing. He needs to call billing office [phone] for further assistance.

## 2018-07-23 NOTE — Telephone Encounter (Signed)
Per office notes from 4/13, I attempted to call the pt to schedule a transfer of care visit and could not leave a message due to the voicemail being full.  Staff message sent to [facility] to call the pt regarding a bill.

## 2018-08-14 NOTE — Telephone Encounter (Signed)
I left a detailed message at the pts cell number to call for a transfer of care visit with Dr Ethlyn Gallery.

## 2018-08-21 DIAGNOSIS — F902 Attention-deficit hyperactivity disorder, combined type: Secondary | ICD-10-CM | POA: Diagnosis not present

## 2018-08-21 DIAGNOSIS — F419 Anxiety disorder, unspecified: Secondary | ICD-10-CM | POA: Diagnosis not present

## 2018-09-21 ENCOUNTER — Other Ambulatory Visit: Payer: Self-pay | Admitting: Family Medicine

## 2018-11-09 ENCOUNTER — Other Ambulatory Visit: Payer: Self-pay | Admitting: Family Medicine

## 2018-11-10 ENCOUNTER — Other Ambulatory Visit: Payer: Self-pay | Admitting: Family Medicine

## 2018-11-12 MED ORDER — SILDENAFIL CITRATE 20 MG PO TABS: ORAL_TABLET | ORAL | 0 refills | Status: DC

## 2018-11-13 ENCOUNTER — Encounter: Payer: Self-pay | Admitting: Family Medicine

## 2018-11-13 ENCOUNTER — Telehealth: Payer: Self-pay | Admitting: Family Medicine

## 2018-11-13 ENCOUNTER — Other Ambulatory Visit: Payer: Self-pay | Admitting: Family Medicine

## 2018-11-13 MED ORDER — SILDENAFIL CITRATE 20 MG PO TABS: ORAL_TABLET | ORAL | 0 refills | Status: DC

## 2018-11-13 NOTE — Telephone Encounter (Signed)
See previous Mychart and Rx request sent to PCP.

## 2018-11-13 NOTE — Telephone Encounter (Signed)
Pt stated he has requested 90 day supply and not 30 day supply because he has to pay more for 30 day supply. Please call pt to advise if 90 day supply can be done. Pt also need an appt for his medication refill.

## 2018-11-15 DIAGNOSIS — M25512 Pain in left shoulder: Secondary | ICD-10-CM | POA: Diagnosis not present

## 2018-11-15 DIAGNOSIS — M25511 Pain in right shoulder: Secondary | ICD-10-CM | POA: Diagnosis not present

## 2018-11-15 NOTE — Telephone Encounter (Signed)
Already addressed

## 2018-11-28 DIAGNOSIS — F902 Attention-deficit hyperactivity disorder, combined type: Secondary | ICD-10-CM | POA: Diagnosis not present

## 2018-11-28 DIAGNOSIS — Z79899 Other long term (current) drug therapy: Secondary | ICD-10-CM | POA: Diagnosis not present

## 2018-11-28 DIAGNOSIS — F419 Anxiety disorder, unspecified: Secondary | ICD-10-CM | POA: Diagnosis not present

## 2018-11-30 ENCOUNTER — Other Ambulatory Visit: Payer: Self-pay | Admitting: Family Medicine

## 2018-12-03 ENCOUNTER — Other Ambulatory Visit: Payer: Self-pay | Admitting: Family Medicine

## 2018-12-03 ENCOUNTER — Encounter: Payer: Self-pay | Admitting: Family Medicine

## 2018-12-03 MED ORDER — LOSARTAN POTASSIUM 100 MG PO TABS: ORAL_TABLET | ORAL | 2 refills | Status: DC

## 2019-01-04 ENCOUNTER — Other Ambulatory Visit: Payer: Self-pay | Admitting: Family Medicine

## 2019-01-11 ENCOUNTER — Encounter: Payer: Self-pay | Admitting: Family Medicine

## 2019-01-11 ENCOUNTER — Other Ambulatory Visit: Payer: Self-pay

## 2019-01-11 ENCOUNTER — Ambulatory Visit (INDEPENDENT_AMBULATORY_CARE_PROVIDER_SITE_OTHER): Payer: BC Managed Care – PPO | Admitting: Family Medicine

## 2019-01-11 VITALS — BP 138/100 | HR 104 | Temp 98.0°F | Ht 73.0 in | Wt 259.8 lb

## 2019-01-11 DIAGNOSIS — Z8042 Family history of malignant neoplasm of prostate: Secondary | ICD-10-CM

## 2019-01-11 DIAGNOSIS — E119 Type 2 diabetes mellitus without complications: Secondary | ICD-10-CM

## 2019-01-11 DIAGNOSIS — Z23 Encounter for immunization: Secondary | ICD-10-CM

## 2019-01-11 DIAGNOSIS — Z800 Family history of malignant neoplasm of digestive organs: Secondary | ICD-10-CM

## 2019-01-11 DIAGNOSIS — R918 Other nonspecific abnormal finding of lung field: Secondary | ICD-10-CM | POA: Diagnosis not present

## 2019-01-11 DIAGNOSIS — I10 Essential (primary) hypertension: Secondary | ICD-10-CM | POA: Diagnosis not present

## 2019-01-11 DIAGNOSIS — E7849 Other hyperlipidemia: Secondary | ICD-10-CM

## 2019-01-11 MED ORDER — PRAVASTATIN SODIUM 40 MG PO TABS: ORAL_TABLET | ORAL | 1 refills | Status: DC

## 2019-01-11 MED ORDER — FENOFIBRATE 54 MG PO TABS: 54.0000 mg | ORAL_TABLET | Freq: Every day | ORAL | 1 refills | Status: DC

## 2019-01-11 MED ORDER — SILDENAFIL CITRATE 20 MG PO TABS: ORAL_TABLET | ORAL | 2 refills | Status: DC

## 2019-01-11 MED ORDER — PANTOPRAZOLE SODIUM 40 MG PO TBEC: 40.0000 mg | DELAYED_RELEASE_TABLET | Freq: Two times a day (BID) | ORAL | 1 refills | Status: DC

## 2019-01-11 MED ORDER — LOSARTAN POTASSIUM 100 MG PO TABS: ORAL_TABLET | ORAL | 1 refills | Status: DC

## 2019-01-11 MED ORDER — AMLODIPINE BESYLATE 5 MG PO TABS: ORAL_TABLET | ORAL | 1 refills | Status: DC

## 2019-01-11 MED ORDER — METFORMIN HCL 1000 MG PO TABS: ORAL_TABLET | ORAL | 1 refills | Status: DC

## 2019-01-11 NOTE — Progress Notes (Signed)
Michael Jacobs DOB: [birth year] Encounter date: 01/11/2019  This is a 48 y.o. male who presents to establish care. Chief Complaint  Patient presents with  . Establish Care    requests a 90-day refill on all medications    History of present illness: Was losing weight but did gain some back during COVID. Back on work out plan. Was all the way up to 305 when diagnosed with diabetes. Goes on eliptycal 45 min/day. Coaching soccer so running/walking with team.   HTN: job is stressful and feels that bp is usually higher when coming to office in mid day. Does check bp at home. When at best it is 120/88. Weight loss did help with bp. Misses his 1/2 tab of the amlodipine so has just been doing 5mg .   A couple of years ago when had colonoscopy had some discomfort afterwards and had CT abd/pelvis as follow up in 12/15. Pulm nodule was seen. Then in 09/2014 there was CT chest completed:  Had a repeat and there was another nodule (this was not commented on in the first CT was was documented as stable). Recommended follow up chest in 18 months. Repeat in 10/2015 showed stable pulmonary nodules and recommended repeat in 6 months to establish 2 years of stability.   Asthma: hasn't needed inhaler in a couple of years. Just keeps inhaler on hand if needed.   Anxiety/ADHD: still follows with psychiatry for this.   GERD:protonix 40mg  control symptoms.  Without this medication he has significant heartburn.    Past Medical History:  Diagnosis Date  . Anxiety   . Asthma   . Cecal ulcer   . Diabetes mellitus ([place])   . GERD (gastroesophageal reflux disease)   . Hiatal hernia   . Hypercholesterolemia   . Hypertension    Past Surgical History:  Procedure Laterality Date  . TONSILLECTOMY AND ADENOIDECTOMY    . UPPER GASTROINTESTINAL ENDOSCOPY    . WISDOM TOOTH EXTRACTION     Allergies  Allergen Reactions  . Ace Inhibitors Cough  . Phenergan [Promethazine Hcl] Anxiety   Current Meds  Medication Sig   . albuterol (PROVENTIL HFA;VENTOLIN HFA) 108 (90 BASE) MCG/ACT inhaler Inhale 2 puffs into the lungs every 6 (six) hours as needed.  . ALPRAZolam (XANAX) 0.25 MG tablet Take 1 tablet (0.25 mg total) by mouth daily as needed for sleep or anxiety.  Marland Kitchen amLODipine (NORVASC) 5 MG tablet TAKE ONE AND ONE-HALF (1 & 1/2) TABLET BY MOUTH DAILY  . fenofibrate 54 MG tablet Take 1 tablet (54 mg total) by mouth daily.  Marland Kitchen losartan (COZAAR) 100 MG tablet TAKE ONE TABLET BY MOUTH DAILY  . metFORMIN (GLUCOPHAGE) 1000 MG tablet TAKE ONE TABLET BY MOUTH TWICE A DAY WITH A MEAL.  . pantoprazole (PROTONIX) 40 MG tablet Take 1 tablet (40 mg total) by mouth 2 (two) times daily.  . pravastatin (PRAVACHOL) 40 MG tablet TAKE ONE TABLET BY MOUTH DAILY  . sildenafil (REVATIO) 20 MG tablet TAKE 3-5 TABLETS AS NEEDED PRIOR TO SEXUAL INTERCOURSE. DO NOT TAKE MORE THAN 1 DOSE IN 24 HOURS  . [DISCONTINUED] amLODipine (NORVASC) 5 MG tablet TAKE ONE AND ONE-HALF (1 & 1/2) TABLET BY MOUTH DAILY  . [DISCONTINUED] fenofibrate 54 MG tablet Take 1 tablet (54 mg total) by mouth daily.  . [DISCONTINUED] losartan (COZAAR) 100 MG tablet TAKE ONE TABLET BY MOUTH DAILY  . [DISCONTINUED] metFORMIN (GLUCOPHAGE) 1000 MG tablet TAKE ONE TABLET BY MOUTH TWICE A DAY WITH A MEAL.  . [DISCONTINUED]  pantoprazole (PROTONIX) 40 MG tablet TAKE ONE TABLET BY MOUTH TWICE A DAY  . [DISCONTINUED] pravastatin (PRAVACHOL) 40 MG tablet TAKE ONE TABLET BY MOUTH DAILY  . [DISCONTINUED] sildenafil (REVATIO) 20 MG tablet TAKE 3-5 TABLETS AS NEEDED PRIOR TO SEXUAL INTERCOURSE. DO NOT TAKE MORE THAN 1 DOSE IN 24 HOURS   Social History   Tobacco Use  . Smoking status: Former Smoker    Packs/day: 0.25    Years: 3.00    Pack years: 0.75    Types: Cigarettes, Cigars  . Smokeless tobacco: Never Used  Substance Use Topics  . Alcohol use: Yes    Alcohol/week: 0.0 standard drinks    Comment: occ   Family History  Problem Relation Age of Onset  . Colon polyps  Mother   . Colon cancer Maternal Grandmother   . Diabetes Maternal Grandmother   . Heart disease Maternal Grandmother   . Colon polyps Maternal Grandmother   . Breast cancer Paternal Grandmother   . Stomach cancer Paternal Grandmother        mets to stomach  . Prostate cancer Maternal Grandfather   . Esophageal cancer Neg Hx   . Rectal cancer Neg Hx   . Lung cancer Paternal Grandmother      Review of Systems  Constitutional: Negative for chills, fatigue and fever.  Respiratory: Negative for cough, chest tightness, shortness of breath and wheezing.   Cardiovascular: Negative for chest pain, palpitations and leg swelling.    Objective:  BP (!) 138/100 (BP Location: Left Arm, Patient Position: Sitting, Cuff Size: Large)   Pulse (!) 104   Temp 98 F (36.7 C) (Temporal)   Ht 6\' 1"  (1.854 m)   Wt 259 lb 12.8 oz (117.8 kg)   SpO2 98%   BMI 34.28 kg/m   Weight: 259 lb 12.8 oz (117.8 kg)   BP Readings from Last 3 Encounters:  01/11/19 (!) 138/100  02/28/17 (!) 120/92  08/30/16 122/90   Wt Readings from Last 3 Encounters:  01/11/19 259 lb 12.8 oz (117.8 kg)  02/28/17 271 lb 1.6 oz (123 kg)  08/30/16 264 lb 14.4 oz (120.2 kg)    EXAM: Physical Exam Constitutional:      General: He is not in acute distress.    Appearance: He is well-developed.  HENT:     Head: Normocephalic and atraumatic.  Cardiovascular:     Rate and Rhythm: Normal rate and regular rhythm.     Heart sounds: Normal heart sounds. No murmur.  Pulmonary:     Effort: Pulmonary effort is normal.     Breath sounds: Normal breath sounds.  Abdominal:     General: Bowel sounds are normal. There is no distension.     Palpations: Abdomen is soft.     Tenderness: There is no abdominal tenderness. There is no guarding.  Skin:    General: Skin is warm and dry.     Comments: Sensory exam of the foot is normal, tested with the monofilament. Good pulses, no lesions or ulcers, good peripheral pulses.  Psychiatric:         Judgment: Judgment normal.     Assessment/Plan  1. Essential hypertension He has not been taking his medication as directed (the amlodipine 1-1/2 tablets daily) but rather just taking 1 tablet daily.  I encouraged him to make sure he is taking the 1-1/2 as I think this will help improve his blood pressure.  Continue losartan at 100 mg.  We will need to follow-up to make sure  that we are meeting blood pressure goals. - Comprehensive metabolic panel; Future - CBC with Differential/Platelet; Future - CBC with Differential/Platelet - Comprehensive metabolic panel  2. Type 2 diabetes mellitus without complication, without long-term current use of insulin ([facility]) - HM DIABETES FOOT EXAM; Future - Microalbumin / creatinine urine ratio; Future - Hemoglobin A1c; Future - Hemoglobin A1c - Microalbumin / creatinine urine ratio  3. Other hyperlipidemia - TSH; Future - Lipid panel; Future - Lipid panel - TSH  4. Pulmonary nodules - CT CHEST NODULE FOLLOW UP LOW DOSE W/O; Future  5. Family history of colon cancer - Ambulatory referral to Gastroenterology  6. Family history of prostate cancer - PSA; Future - PSA  7. Need for immunization against influenza - Flu Vaccine QUAD 6+ mos PF IM (Fluarix Quad PF)    Return for pending bloodwork.     Micheline Rough, MD

## 2019-01-12 LAB — COMPREHENSIVE METABOLIC PANEL
AG Ratio: 2 (calc) (ref 1.0–2.5)
ALT: 35 U/L (ref 9–46)
AST: 17 U/L (ref 10–40)
Albumin: 4.7 g/dL (ref 3.6–5.1)
Alkaline phosphatase (APISO): 62 U/L (ref 36–130)
BUN: 15 mg/dL (ref 7–25)
CO2: 24 mmol/L (ref 20–32)
Calcium: 10.2 mg/dL (ref 8.6–10.3)
Chloride: 106 mmol/L (ref 98–110)
Creat: 1.02 mg/dL (ref 0.60–1.35)
Globulin: 2.3 g/dL (calc) (ref 1.9–3.7)
Glucose, Bld: 101 mg/dL — ABNORMAL HIGH (ref 65–99)
Potassium: 5 mmol/L (ref 3.5–5.3)
Sodium: 142 mmol/L (ref 135–146)
Total Bilirubin: 0.9 mg/dL (ref 0.2–1.2)
Total Protein: 7 g/dL (ref 6.1–8.1)

## 2019-01-12 LAB — MICROALBUMIN / CREATININE URINE RATIO
Creatinine, Urine: 148 mg/dL (ref 20–320)
Microalb Creat Ratio: 13 mcg/mg creat (ref <30)
Microalb, Ur: 1.9 mg/dL

## 2019-01-12 LAB — CBC WITH DIFFERENTIAL/PLATELET
Absolute Monocytes: 639 cells/uL (ref 200–950)
Basophils Absolute: 31 cells/uL (ref 0–200)
Basophils Relative: 0.3 %
Eosinophils Absolute: 82 cells/uL (ref 15–500)
Eosinophils Relative: 0.8 %
HCT: 46.9 % (ref 38.5–50.0)
Hemoglobin: 15.8 g/dL (ref 13.2–17.1)
Lymphs Abs: 3255 cells/uL (ref 850–3900)
MCH: 29.3 pg (ref 27.0–33.0)
MCHC: 33.7 g/dL (ref 32.0–36.0)
MCV: 87 fL (ref 80.0–100.0)
MPV: 11.2 fL (ref 7.5–12.5)
Monocytes Relative: 6.2 %
Neutro Abs: 6293 cells/uL (ref 1500–7800)
Neutrophils Relative %: 61.1 %
Platelets: 334 10*3/uL (ref 140–400)
RBC: 5.39 10*6/uL (ref 4.20–5.80)
RDW: 13.8 % (ref 11.0–15.0)
Total Lymphocyte: 31.6 %
WBC: 10.3 10*3/uL (ref 3.8–10.8)

## 2019-01-12 LAB — LIPID PANEL
Cholesterol: 176 mg/dL (ref <200)
HDL: 41 mg/dL (ref >=40)
LDL Cholesterol (Calc): 101 mg/dL (calc) — ABNORMAL HIGH
Non-HDL Cholesterol (Calc): 135 mg/dL (calc) — ABNORMAL HIGH (ref <130)
Total CHOL/HDL Ratio: 4.3 (calc) (ref <5.0)
Triglycerides: 225 mg/dL — ABNORMAL HIGH (ref <150)

## 2019-01-12 LAB — PSA: PSA: 0.4 ng/mL (ref <=4.0)

## 2019-01-12 LAB — TSH: TSH: 1.67 mIU/L (ref 0.40–4.50)

## 2019-01-12 LAB — HEMOGLOBIN A1C
Hgb A1c MFr Bld: 6.5 % of total Hgb — ABNORMAL HIGH (ref <5.7)
Mean Plasma Glucose: 140 (calc)
eAG (mmol/L): 7.7 (calc)

## 2019-02-11 ENCOUNTER — Encounter: Payer: Self-pay | Admitting: Gastroenterology

## 2019-02-22 DIAGNOSIS — Z79899 Other long term (current) drug therapy: Secondary | ICD-10-CM | POA: Diagnosis not present

## 2019-02-22 DIAGNOSIS — F902 Attention-deficit hyperactivity disorder, combined type: Secondary | ICD-10-CM | POA: Diagnosis not present

## 2019-02-22 DIAGNOSIS — F419 Anxiety disorder, unspecified: Secondary | ICD-10-CM | POA: Diagnosis not present

## 2019-03-04 ENCOUNTER — Encounter: Payer: Self-pay | Admitting: Gastroenterology

## 2019-03-04 ENCOUNTER — Ambulatory Visit (AMBULATORY_SURGERY_CENTER): Payer: BC Managed Care – PPO

## 2019-03-04 ENCOUNTER — Other Ambulatory Visit: Payer: Self-pay

## 2019-03-04 VITALS — Temp 97.4°F | Ht 73.0 in | Wt 267.4 lb

## 2019-03-04 DIAGNOSIS — Z86010 Personal history of colonic polyps: Secondary | ICD-10-CM

## 2019-03-04 DIAGNOSIS — Z1159 Encounter for screening for other viral diseases: Secondary | ICD-10-CM

## 2019-03-04 MED ORDER — NA SULFATE-K SULFATE-MG SULF 17.5-3.13-1.6 GM/177ML PO SOLN: 1.0000 | Freq: Once | ORAL | 0 refills | Status: AC

## 2019-03-04 NOTE — Progress Notes (Signed)
No egg or soy allergy known to patient  No issues with past sedation with any surgeries  or procedures, no intubation problems  No diet pills per patient No home 02 use per patient  No blood thinners per patient  Pt denies issues with constipation  No A fib or A flutter  EMMI video sent to pt's e mail   Due to the COVID-19 pandemic we are asking patients to follow these guidelines. Please only bring one care partner. Please be aware that your care partner may wait in the car in the parking lot or if they feel like they will be too hot to wait in the car, they may wait in the lobby on the 4th floor. All care partners are required to wear a mask the entire time (we do not have any that we can provide them), they need to practice social distancing, and we will do a Covid check for all patient's and care partners when you arrive. Also we will check their temperature and your temperature. If the care partner waits in their car they need to stay in the parking lot the entire time and we will call them on their cell phone when the patient is ready for discharge so they can bring the car to the front of the building. Also all patient's will need to wear a mask into building.  Coupon for suprep given.

## 2019-03-18 ENCOUNTER — Telehealth: Payer: Self-pay | Admitting: Gastroenterology

## 2019-03-19 ENCOUNTER — Ambulatory Visit (INDEPENDENT_AMBULATORY_CARE_PROVIDER_SITE_OTHER): Payer: BC Managed Care – PPO

## 2019-03-19 ENCOUNTER — Other Ambulatory Visit: Payer: Self-pay | Admitting: Gastroenterology

## 2019-03-19 DIAGNOSIS — Z1159 Encounter for screening for other viral diseases: Secondary | ICD-10-CM

## 2019-03-20 LAB — SARS CORONAVIRUS 2 (TAT 6-24 HRS): SARS Coronavirus 2: NEGATIVE

## 2019-03-22 ENCOUNTER — Other Ambulatory Visit: Payer: Self-pay

## 2019-03-22 ENCOUNTER — Ambulatory Visit (AMBULATORY_SURGERY_CENTER): Payer: BC Managed Care – PPO | Admitting: Gastroenterology

## 2019-03-22 ENCOUNTER — Encounter: Payer: Self-pay | Admitting: Gastroenterology

## 2019-03-22 VITALS — BP 136/91 | HR 71 | Temp 98.2°F | Resp 15 | Ht 73.0 in | Wt 267.0 lb

## 2019-03-22 DIAGNOSIS — D123 Benign neoplasm of transverse colon: Secondary | ICD-10-CM

## 2019-03-22 DIAGNOSIS — Z86010 Personal history of colonic polyps: Secondary | ICD-10-CM

## 2019-03-22 DIAGNOSIS — K621 Rectal polyp: Secondary | ICD-10-CM

## 2019-03-22 DIAGNOSIS — D127 Benign neoplasm of rectosigmoid junction: Secondary | ICD-10-CM | POA: Diagnosis not present

## 2019-03-22 DIAGNOSIS — K635 Polyp of colon: Secondary | ICD-10-CM | POA: Diagnosis not present

## 2019-03-22 DIAGNOSIS — Z8371 Family history of colonic polyps: Secondary | ICD-10-CM

## 2019-03-22 DIAGNOSIS — D128 Benign neoplasm of rectum: Secondary | ICD-10-CM

## 2019-03-22 DIAGNOSIS — D125 Benign neoplasm of sigmoid colon: Secondary | ICD-10-CM

## 2019-03-22 MED ORDER — SODIUM CHLORIDE 0.9 % IV SOLN: 500.0000 mL | Freq: Once | INTRAVENOUS | Status: DC

## 2019-03-22 NOTE — Progress Notes (Signed)
Pt's states no medical or surgical changes since previsit or office visit.  Temp taken by JB VS taken by KA

## 2019-03-22 NOTE — Patient Instructions (Signed)
YOU HAD AN ENDOSCOPIC PROCEDURE TODAY AT THE [facility]:   Refer to the procedure report that was given to you for any specific questions about what was found during the examination.  If the procedure report does not answer your questions, please call your gastroenterologist to clarify.  If you requested that your care partner not be given the details of your procedure findings, then the procedure report has been included in a sealed envelope for you to review at your convenience later.  YOU SHOULD EXPECT: Some feelings of bloating in the abdomen. Passage of more gas than usual.  Walking can help get rid of the air that was put into your GI tract during the procedure and reduce the bloating. If you had a lower endoscopy (such as a colonoscopy or flexible sigmoidoscopy) you may notice spotting of blood in your stool or on the toilet paper. If you underwent a bowel prep for your procedure, you may not have a normal bowel movement for a few days.  Please Note:  You might notice some irritation and congestion in your nose or some drainage.  This is from the oxygen used during your procedure.  There is no need for concern and it should clear up in a day or so.  SYMPTOMS TO REPORT IMMEDIATELY:   Following lower endoscopy (colonoscopy or flexible sigmoidoscopy):  Excessive amounts of blood in the stool  Significant tenderness or worsening of abdominal pains  Swelling of the abdomen that is new, acute  Fever of 100F or higher  For urgent or emergent issues, a gastroenterologist can be reached at any hour by calling [phone].   DIET:  We do recommend a small meal at first, but then you may proceed to your regular diet.  Drink plenty of fluids but you should avoid alcoholic beverages for 24 hours.  ACTIVITY:  You should plan to take it easy for the rest of today and you should NOT DRIVE or use heavy machinery until tomorrow (because of the sedation medicines used during the test).     FOLLOW UP: Our staff will call the number listed on your records 48-72 hours following your procedure to check on you and address any questions or concerns that you may have regarding the information given to you following your procedure. If we do not reach you, we will leave a message.  We will attempt to reach you two times.  During this call, we will ask if you have developed any symptoms of COVID 19. If you develop any symptoms (ie: fever, flu-like symptoms, shortness of breath, cough etc.) before then, please call [phone].  If you test positive for Covid 19 in the 2 weeks post procedure, please call and report this information to us.    If any biopsies were taken you will be contacted by phone or by letter within the next 1-3 weeks.  Please call us at [phone] if you have not heard about the biopsies in 3 weeks.    SIGNATURES/CONFIDENTIALITY: You and/or your care partner have signed paperwork which will be entered into your electronic medical record.  These signatures attest to the fact that that the information above on your After Visit Summary has been reviewed and is understood.  Full responsibility of the confidentiality of this discharge information lies with you and/or your care-partner. 

## 2019-03-22 NOTE — Op Note (Addendum)
[facility] Patient Name: Michael Jacobs Procedure Date: 03/22/2019 8:30 AM MRN: [id number] Endoscopist: Ladene Artist , MD Age: 48 Referring MD:  Date of Birth: 05-06-1970 Gender: Male Account #: [id number] Procedure:                Colonoscopy Indications:              Surveillance: Personal history of adenomatous                            polyps on last colonoscopy 5 years ago. Family                            history of colon polyps. Medicines:                Monitored Anesthesia Care Procedure:                Pre-Anesthesia Assessment:                           - Prior to the procedure, a History and Physical                            was performed, and patient medications and                            allergies were reviewed. The patient's tolerance of                            previous anesthesia was also reviewed. The risks                            and benefits of the procedure and the sedation                            options and risks were discussed with the patient.                            All questions were answered, and informed consent                            was obtained. Prior Anticoagulants: The patient has                            taken no previous anticoagulant or antiplatelet                            agents. ASA Grade Assessment: II - A patient with                            mild systemic disease. After reviewing the risks                            and benefits, the patient was deemed in  satisfactory condition to undergo the procedure.                           After obtaining informed consent, the colonoscope                            was passed under direct vision. Throughout the                            procedure, the patient's blood pressure, pulse, and                            oxygen saturations were monitored continuously. The                            Colonoscope was introduced through the  anus and                            advanced to the the cecum, identified by                            appendiceal orifice and ileocecal valve. The                            ileocecal valve, appendiceal orifice, and rectum                            were photographed. The quality of the bowel                            preparation was good. The colonoscopy was performed                            without difficulty. The patient tolerated the                            procedure well. Scope In: 8:33:22 AM Scope Out: 8:48:57 AM Scope Withdrawal Time: 0 hours 13 minutes 21 seconds  Total Procedure Duration: 0 hours 15 minutes 35 seconds  Findings:                 The perianal and digital rectal examinations were                            normal.                           Three sessile polyps were found in the rectum,                            sigmoid colon and transverse colon. The polyps were                            6 to 7 mm in size. These polyps were removed with a  cold snare. Resection and retrieval were complete.                           A few small-mouthed diverticula were found in the                            left colon. There was no evidence of diverticular                            bleeding.                           Internal hemorrhoids were found during                            retroflexion. The hemorrhoids were medium-sized and                            Grade I (internal hemorrhoids that do not prolapse).                           The exam was otherwise without abnormality on                            direct and retroflexion views. Complications:            No immediate complications. Estimated blood loss:                            None. Estimated Blood Loss:     Estimated blood loss: none. Impression:               - Three 6 to 7 mm polyps in the rectum, in the                            sigmoid colon and in the transverse colon,  removed                            with a cold snare. Resected and retrieved.                           - Mild diverticulosis in the left colon.                           - Internal hemorrhoids.                           - The examination was otherwise normal on direct                            and retroflexion views. Recommendation:           - Repeat colonoscopy after studies are complete for                            surveillance based on pathology results.                           -  Patient has a contact number available for                            emergencies. The signs and symptoms of potential                            delayed complications were discussed with the                            patient. Return to normal activities tomorrow.                            Written discharge instructions were provided to the                            patient.                           - High fiber diet.                           - Continue present medications.                           - Await pathology results. Ladene Artist, MD 03/22/2019 8:53:56 AM This report has been signed electronically.

## 2019-03-22 NOTE — Progress Notes (Signed)
Called to room to assist during endoscopic procedure.  Patient ID and intended procedure confirmed with present staff. Received instructions for my participation in the procedure from the performing physician.  

## 2019-03-22 NOTE — Progress Notes (Signed)
Report to PACU, RN, vss, BBS= Clear.  

## 2019-03-26 ENCOUNTER — Telehealth: Payer: Self-pay

## 2019-03-26 ENCOUNTER — Telehealth: Payer: Self-pay | Admitting: *Deleted

## 2019-03-26 NOTE — Telephone Encounter (Signed)
No answer on follow up phone call in the morning.

## 2019-03-26 NOTE — Telephone Encounter (Signed)
  Follow up Call-  Call back number 03/22/2019  Post procedure Call Back phone  # [phone]  Permission to leave phone message Yes  Some recent data might be hidden     No answer at # given.  LM on VM.

## 2019-03-27 ENCOUNTER — Encounter: Payer: Self-pay | Admitting: Gastroenterology

## 2019-04-26 ENCOUNTER — Other Ambulatory Visit: Payer: Self-pay

## 2019-04-26 ENCOUNTER — Other Ambulatory Visit: Payer: Self-pay | Admitting: Family Medicine

## 2019-04-26 ENCOUNTER — Other Ambulatory Visit (INDEPENDENT_AMBULATORY_CARE_PROVIDER_SITE_OTHER): Payer: BC Managed Care – PPO

## 2019-04-26 DIAGNOSIS — I10 Essential (primary) hypertension: Secondary | ICD-10-CM | POA: Diagnosis not present

## 2019-04-26 DIAGNOSIS — E119 Type 2 diabetes mellitus without complications: Secondary | ICD-10-CM

## 2019-04-26 DIAGNOSIS — E7849 Other hyperlipidemia: Secondary | ICD-10-CM

## 2019-04-26 LAB — CBC WITH DIFFERENTIAL/PLATELET
Basophils Absolute: 0 10*3/uL (ref 0.0–0.1)
Basophils Relative: 0.4 % (ref 0.0–3.0)
Eosinophils Absolute: 0.2 10*3/uL (ref 0.0–0.7)
Eosinophils Relative: 1.6 % (ref 0.0–5.0)
HCT: 46.9 % (ref 39.0–52.0)
Hemoglobin: 15.8 g/dL (ref 13.0–17.0)
Lymphocytes Relative: 35.6 % (ref 12.0–46.0)
Lymphs Abs: 3.5 10*3/uL (ref 0.7–4.0)
MCHC: 33.6 g/dL (ref 30.0–36.0)
MCV: 87.7 fl (ref 78.0–100.0)
Monocytes Absolute: 0.6 10*3/uL (ref 0.1–1.0)
Monocytes Relative: 6 % (ref 3.0–12.0)
Neutro Abs: 5.6 10*3/uL (ref 1.4–7.7)
Neutrophils Relative %: 56.4 % (ref 43.0–77.0)
Platelets: 314 10*3/uL (ref 150.0–400.0)
RBC: 5.34 Mil/uL (ref 4.22–5.81)
RDW: 14.2 % (ref 11.5–15.5)
WBC: 9.9 10*3/uL (ref 4.0–10.5)

## 2019-04-26 LAB — COMPREHENSIVE METABOLIC PANEL
ALT: 41 U/L (ref 0–53)
AST: 20 U/L (ref 0–37)
Albumin: 4.4 g/dL (ref 3.5–5.2)
Alkaline Phosphatase: 79 U/L (ref 39–117)
BUN: 21 mg/dL (ref 6–23)
CO2: 24 mEq/L (ref 19–32)
Calcium: 9.7 mg/dL (ref 8.4–10.5)
Chloride: 104 mEq/L (ref 96–112)
Creatinine, Ser: 0.95 mg/dL (ref 0.40–1.50)
GFR: 85.12 mL/min (ref >60.00)
Glucose, Bld: 154 mg/dL — ABNORMAL HIGH (ref 70–99)
Potassium: 4.4 mEq/L (ref 3.5–5.1)
Sodium: 139 mEq/L (ref 135–145)
Total Bilirubin: 0.7 mg/dL (ref 0.2–1.2)
Total Protein: 6.7 g/dL (ref 6.0–8.3)

## 2019-04-26 LAB — LIPID PANEL
Cholesterol: 185 mg/dL (ref 0–200)
HDL: 32.7 mg/dL — ABNORMAL LOW (ref >39.00)
Total CHOL/HDL Ratio: 6
Triglycerides: 479 mg/dL — ABNORMAL HIGH (ref 0.0–149.0)

## 2019-04-26 LAB — LDL CHOLESTEROL, DIRECT: Direct LDL: 94 mg/dL

## 2019-04-26 LAB — TSH: TSH: 3.02 u[IU]/mL (ref 0.35–4.50)

## 2019-04-26 LAB — HEMOGLOBIN A1C: Hgb A1c MFr Bld: 7.2 % — ABNORMAL HIGH (ref 4.6–6.5)

## 2019-04-29 LAB — ABO AND RH

## 2019-05-03 ENCOUNTER — Encounter: Payer: Self-pay | Admitting: Family Medicine

## 2019-05-03 ENCOUNTER — Telehealth (INDEPENDENT_AMBULATORY_CARE_PROVIDER_SITE_OTHER): Payer: BC Managed Care – PPO | Admitting: Family Medicine

## 2019-05-03 ENCOUNTER — Other Ambulatory Visit: Payer: Self-pay

## 2019-05-03 VITALS — Wt 262.0 lb

## 2019-05-03 DIAGNOSIS — K219 Gastro-esophageal reflux disease without esophagitis: Secondary | ICD-10-CM | POA: Diagnosis not present

## 2019-05-03 DIAGNOSIS — R911 Solitary pulmonary nodule: Secondary | ICD-10-CM

## 2019-05-03 DIAGNOSIS — E7849 Other hyperlipidemia: Secondary | ICD-10-CM | POA: Diagnosis not present

## 2019-05-03 DIAGNOSIS — E119 Type 2 diabetes mellitus without complications: Secondary | ICD-10-CM

## 2019-05-03 DIAGNOSIS — I10 Essential (primary) hypertension: Secondary | ICD-10-CM

## 2019-05-03 DIAGNOSIS — F4322 Adjustment disorder with anxiety: Secondary | ICD-10-CM

## 2019-05-03 MED ORDER — JARDIANCE 10 MG PO TABS: 10.0000 mg | ORAL_TABLET | Freq: Every day | ORAL | 1 refills | Status: DC

## 2019-05-03 NOTE — Progress Notes (Signed)
Virtual Visit via Video Note  I connected with Michael Jacobs  on 05/03/19 at 10:00 AM EST by a video enabled telemedicine application and verified that I am speaking with the correct person using two identifiers.  Location patient: home Location provider:work or home office Persons participating in the virtual visit: patient, provider  I discussed the limitations of evaluation and management by telemedicine and the availability of in person appointments. The patient expressed understanding and agreed to proceed.   Michael Jacobs DOB: [birth year] Encounter date: 05/03/2019  This is a 49 y.o. male who presents with Chief Complaint  Patient presents with  . Follow-up    History of present illness:  Hypertension: Amlodipine 7.5 mg daily, losartan 100 mg daily. Last bp was 121/87 (a week ago).   Anxiety/ADHD: still follows with psychiatry for this.  Vyvanse 30 mg daily, Klonopin 0.5 mg at bedtime  Hyperlipidemia: Pravastatin 40 mg daily.  LDL slightly above goal on recent blood work.  Also taking fenofibrate 54 mg daily. Suspects eating contributed to this.   GERD:protonix 40mg  control symptoms.  Without this medication he has significant heartburn. Hasn't had issues with this on the protonix.   Diabetes type 2: A1c 7.2 on recent blood work.  This is up from 6.5 back in October.  Metformin 1000 mg twice daily. Had a feeling that sugar would be a little high after the holidays. Hasn't been best about getting exercise and avoiding starches/carbs. Back on routine now. He has already made some changes to daily foods to keep carb level lower.   Follow-up CT chest was ordered due to history of pulmonary nodule.  This is not yet been completed. He was told that this was not covered for him. Took initial scan as his first scan; they are saying [employer]) that it is denied.   Has lost weight since last visit.   Allergies  Allergen Reactions  . Ace Inhibitors Cough  . Phenergan  [Promethazine Hcl] Anxiety   Current Meds  Medication Sig  . albuterol (PROVENTIL HFA;VENTOLIN HFA) 108 (90 BASE) MCG/ACT inhaler Inhale 2 puffs into the lungs every 6 (six) hours as needed.  . ALPRAZolam (XANAX) 0.25 MG tablet Take 1 tablet (0.25 mg total) by mouth daily as needed for sleep or anxiety.  Marland Kitchen amLODipine (NORVASC) 5 MG tablet TAKE ONE AND ONE-HALF (1 & 1/2) TABLET BY MOUTH DAILY  . clonazePAM (KLONOPIN) 0.5 MG tablet Take 1 mg by mouth at bedtime.  . fenofibrate 54 MG tablet Take 1 tablet (54 mg total) by mouth daily.  Marland Kitchen losartan (COZAAR) 100 MG tablet TAKE ONE TABLET BY MOUTH DAILY  . metFORMIN (GLUCOPHAGE) 1000 MG tablet TAKE ONE TABLET BY MOUTH TWICE A DAY WITH A MEAL.  . Multiple Vitamins-Minerals (MENS MULTIVITAMIN PO) Take 1 tablet by mouth daily.  . pantoprazole (PROTONIX) 40 MG tablet Take 1 tablet (40 mg total) by mouth 2 (two) times daily.  . pravastatin (PRAVACHOL) 40 MG tablet TAKE ONE TABLET BY MOUTH DAILY  . sildenafil (REVATIO) 20 MG tablet TAKE 3-5 TABLETS AS NEEDED PRIOR TO SEXUAL INTERCOURSE. DO NOT TAKE MORE THAN 1 DOSE IN 24 HOURS  . VYVANSE 30 MG capsule Take 30 mg by mouth daily.    Review of Systems  Constitutional: Negative for chills, fatigue and fever.  Respiratory: Negative for cough, chest tightness, shortness of breath and wheezing.   Cardiovascular: Negative for chest pain, palpitations and leg swelling.    Objective:  Wt 262 lb (118.8 kg)  BMI 34.57 kg/m   Weight: 262 lb (118.8 kg)   BP Readings from Last 3 Encounters:  03/22/19 (!) 136/91  01/11/19 (!) 138/100  02/28/17 (!) 120/92   Wt Readings from Last 3 Encounters:  05/03/19 262 lb (118.8 kg)  03/22/19 267 lb (121.1 kg)  03/04/19 267 lb 6.4 oz (121.3 kg)    EXAM:  GENERAL: alert, oriented, appears well and in no acute distress  HEENT: atraumatic, conjunctiva clear, no obvious abnormalities on inspection of external nose and ears  NECK: normal movements of the head and  neck  LUNGS: on inspection no signs of respiratory distress, breathing rate appears normal, no obvious gross SOB, gasping or wheezing  CV: no obvious cyanosis  MS: moves all visible extremities without noticeable abnormality  PSYCH/NEURO: pleasant and cooperative, no obvious depression or anxiety, speech and thought processing grossly intact  Assessment/Plan  1. Essential hypertension Improved control.  Continue current doses of medication.  2. Gastroesophageal reflux disease, unspecified whether esophagitis present Stable on PPI.  Continue current medication.  3. Type 2 diabetes mellitus without complication, without long-term current use of insulin ([place]) Source control.  We are can add Jardiance after discussion. Discussed new medication(s) today with patient. Discussed potential side effects and patient verbalized understanding.  He is also going to work on exercise and healthier eating.  We will plan to recheck A1c in 3 months time.  - Hemoglobin A1c; Future  4. Other hyperlipidemia Elevated cholesterol.  He does not wish to adjust medications, but instead work on diet and exercise.  We will plan to recheck in 3 months time. - Lipid panel; Future  5. Adjustment disorder with anxiety Mood has been stable.  6. Pulmonary nodule *Was initially noted on a CT of the abdomen and pelvis on 04/03/2014.  Right middle lobe 5.5 and 3.3 mm nodules.  Recommendation was to follow-up in 6 to 12 months.  Repeat CT of the chest done on 09/25/2014 showed this nodule stable.  Follow-up was recommended in 18 months for 2 years of stability.  CT done on 11/02/2015 showed stable pulmonary nodules for 1.5 years.  Radiology read consider repeat for 2-year stability.  I did look back through the scans, as well as the Fleischner guidelines, and feels that the confusion with insurance covering his repeat CT is secondary to change in the guidelines.  Currently, for pulmonary nodules less than 6 mm in size, for  low risk patients there is no follow-up for high risk there would be a 12-year follow-up for stability in size.  Patient has a lot of anxiety due to history of smoking and would like to have a CT done to ensure stability in these nodules. I am going to try to look into other self-pay options for him with this regard.    I discussed the assessment and treatment plan with the patient. The patient was provided an opportunity to ask questions and all were answered. The patient agreed with the plan and demonstrated an understanding of the instructions.   The patient was advised to call back or seek an in-person evaluation if the symptoms worsen or if the condition fails to improve as anticipated.  I provided 30 minutes of non-face-to-face time during this encounter.   Micheline Rough, MD

## 2019-05-04 ENCOUNTER — Encounter: Payer: Self-pay | Admitting: Family Medicine

## 2019-05-07 NOTE — Progress Notes (Signed)
Spoke with the pt and scheduled an appts as below.

## 2019-06-21 ENCOUNTER — Ambulatory Visit: Payer: BC Managed Care – PPO | Attending: Internal Medicine

## 2019-06-21 DIAGNOSIS — Z23 Encounter for immunization: Secondary | ICD-10-CM

## 2019-06-21 NOTE — Progress Notes (Signed)
   Covid-19 Vaccination Clinic  Name:  YVON DECK    MRN: [id number] DOB: Oct 30, 1970  06/21/2019  Mr. Robarge was observed post Covid-19 immunization for 15 minutes without incident. He was provided with Vaccine Information Sheet and instruction to access the V-Safe system.   Mr. Murach was instructed to call 911 with any severe reactions post vaccine: Marland Kitchen Difficulty breathing  . Swelling of face and throat  . A fast heartbeat  . A bad rash all over body  . Dizziness and weakness   Immunizations Administered    Name Date Dose VIS Date Route   Pfizer COVID-19 Vaccine 06/21/2019 11:37 AM 0.3 mL 03/22/2019 Intramuscular   Manufacturer: [place]   Lot: [id number]   [place]: [id number]

## 2019-07-17 ENCOUNTER — Ambulatory Visit: Payer: BC Managed Care – PPO | Attending: Internal Medicine

## 2019-07-17 DIAGNOSIS — Z23 Encounter for immunization: Secondary | ICD-10-CM

## 2019-07-17 NOTE — Progress Notes (Signed)
   Covid-19 Vaccination Clinic  Name:  Michael Jacobs    MRN: [id number] DOB: 08-Jun-1970  07/17/2019  [name] was observed post Covid-19 immunization for 15 minutes without incident. He was provided with Vaccine Information Sheet and instruction to access the V-Safe system.   [name] was instructed to call 911 with any severe reactions post vaccine: Marland Kitchen Difficulty breathing  . Swelling of face and throat  . A fast heartbeat  . A bad rash all over body  . Dizziness and weakness   Immunizations Administered    Name Date Dose VIS Date Route   Pfizer COVID-19 Vaccine 07/17/2019 10:38 AM 0.3 mL 03/22/2019 Intramuscular   Manufacturer: [employer], [employer]   Lot: [id number]   [place]: [id number]

## 2019-07-17 NOTE — Progress Notes (Signed)
   Covid-19 Vaccination Clinic  Name:  [name]    MRN: [id number] DOB: 05-20-1970  07/17/2019  [name] was observed post Covid-19 immunization for 15 minutes without incident. He was provided with Vaccine Information Sheet and instruction to access the V-Safe system.   [name] was instructed to call 911 with any severe reactions post vaccine: Marland Kitchen Difficulty breathing  . Swelling of face and throat  . A fast heartbeat  . A bad rash all over body  . Dizziness and weakness   Immunizations Administered    Name Date Dose VIS Date Route   Pfizer COVID-19 Vaccine 07/17/2019 10:38 AM 0.3 mL 03/22/2019 Intramuscular   Manufacturer: [employer], [employer]   Lot: [id number]   [place]: [id number]

## 2019-08-05 ENCOUNTER — Other Ambulatory Visit: Payer: Self-pay

## 2019-08-05 ENCOUNTER — Other Ambulatory Visit (INDEPENDENT_AMBULATORY_CARE_PROVIDER_SITE_OTHER): Payer: BC Managed Care – PPO

## 2019-08-05 DIAGNOSIS — E119 Type 2 diabetes mellitus without complications: Secondary | ICD-10-CM | POA: Diagnosis not present

## 2019-08-05 DIAGNOSIS — E7849 Other hyperlipidemia: Secondary | ICD-10-CM | POA: Diagnosis not present

## 2019-08-05 LAB — LIPID PANEL
Cholesterol: 174 mg/dL (ref 0–200)
HDL: 34.2 mg/dL — ABNORMAL LOW (ref >39.00)
LDL Cholesterol: 101 mg/dL — ABNORMAL HIGH (ref 0–99)
NonHDL: 139.55
Total CHOL/HDL Ratio: 5
Triglycerides: 193 mg/dL — ABNORMAL HIGH (ref 0.0–149.0)
VLDL: 38.6 mg/dL (ref 0.0–40.0)

## 2019-08-05 LAB — HEMOGLOBIN A1C: Hgb A1c MFr Bld: 6.6 % — ABNORMAL HIGH (ref 4.6–6.5)

## 2019-08-07 ENCOUNTER — Other Ambulatory Visit: Payer: BC Managed Care – PPO

## 2019-08-09 ENCOUNTER — Encounter: Payer: Self-pay | Admitting: Family Medicine

## 2019-08-09 ENCOUNTER — Telehealth (INDEPENDENT_AMBULATORY_CARE_PROVIDER_SITE_OTHER): Payer: BC Managed Care – PPO | Admitting: Family Medicine

## 2019-08-09 DIAGNOSIS — K5792 Diverticulitis of intestine, part unspecified, without perforation or abscess without bleeding: Secondary | ICD-10-CM | POA: Diagnosis not present

## 2019-08-09 MED ORDER — CIPROFLOXACIN HCL 500 MG PO TABS: 500.0000 mg | ORAL_TABLET | Freq: Two times a day (BID) | ORAL | 0 refills | Status: DC

## 2019-08-09 MED ORDER — METRONIDAZOLE 500 MG PO TABS: 500.0000 mg | ORAL_TABLET | Freq: Three times a day (TID) | ORAL | 0 refills | Status: DC

## 2019-08-09 NOTE — Progress Notes (Signed)
Virtual Visit via Video Note  I connected with Michael Jacobs on 08/09/19 at 10:45 AM EDT by a video enabled telemedicine application and verified that I am speaking with the correct person using two identifiers.  Location patient: home Location provider:work or home office Persons participating in the virtual visit: patient, provider  I discussed the limitations of evaluation and management by telemedicine and the availability of in person appointments. The patient expressed understanding and agreed to proceed.   Michael Jacobs DOB: 26-Feb-1971 Encounter date: 08/09/2019  This is a 49 y.o. male who presents with No chief complaint on file.   History of present illness: Last Thursday started with stomach issues.   Has had diverticulitis in the past. Started getting some tenderness in lower bowel. Friday night got up (he thinks) 30 times to go to the bathroom. Just a little would come out. Has stopped eating as much so this has eased off some. Has some mucous coming out (which is what happened last time). In past was put on cipro/flagyl and it worked for him.   Pain is the same as it was before. Was really bad on Friday night but did improve as he changed diet to more liquid.   Can't tell if he is constipated.   He doesn't feel like he is emptying everything out. This is how it felt for him in the past.   No blood in the stools.   Clear stools. No fevers.   Bp: 137/93 HR 75  Weight 261lb  No nausea or vomiting.   When pain gets worse he has to go to the bathroom which helps temporarily.   A week ago he had some dysuria and then this happened.   No abnormal foods; but states that he did eat a couple of bags of peanuts and this is out of his norm. Had more alcohol than normal last week as well, but hasn't been affected by this in past.   Belly is not sore to touch unless pushing hard. Is able to push in pretty hard without having significant pain.    Allergies  Allergen  Reactions  . Ace Inhibitors Cough  . Phenergan [Promethazine Hcl] Anxiety   No outpatient medications have been marked as taking for the 08/09/19 encounter (Video Visit) with Caren Macadam, MD.    Review of Systems  Constitutional: Positive for appetite change. Negative for chills, fatigue and fever.  Respiratory: Negative for cough, chest tightness, shortness of breath and wheezing.   Cardiovascular: Negative for chest pain, palpitations and leg swelling.  Gastrointestinal: Positive for abdominal pain, constipation and diarrhea. Negative for blood in stool, nausea and vomiting.  Genitourinary: Negative for frequency.    Objective:  There were no vitals taken for this visit.      BP Readings from Last 3 Encounters:  03/22/19 (!) 136/91  01/11/19 (!) 138/100  02/28/17 (!) 120/92   Wt Readings from Last 3 Encounters:  05/03/19 262 lb (118.8 kg)  03/22/19 267 lb (121.1 kg)  03/04/19 267 lb 6.4 oz (121.3 kg)    EXAM:  GENERAL: alert, oriented, appears well and in no acute distress  HEENT: atraumatic, conjunctiva clear, no obvious abnormalities on inspection of external nose and ears  NECK: normal movements of the head and neck  LUNGS: on inspection no signs of respiratory distress, breathing rate appears normal, no obvious gross SOB, gasping or wheezing  CV: no obvious cyanosis  MS: moves all visible extremities without noticeable abnormality  PSYCH/NEURO:  pleasant and cooperative, no obvious depression or anxiety, speech and thought processing grossly intact  Patient is able to push on lower abd without significant discomfort; area of tenderness is mid lower abd  Assessment/Plan  1. Diverticulitis Has had this previously and responded well to cipro/flagyl. Will give this combo again; bland, more liquid diet recommended. Needs to be evaluated if any worsening of sx. Already has appt next week for follow up. - metroNIDAZOLE (FLAGYL) 500 MG tablet; Take 1 tablet  (500 mg total) by mouth 3 (three) times daily.  Dispense: 21 tablet; Refill: 0 - ciprofloxacin (CIPRO) 500 MG tablet; Take 1 tablet (500 mg total) by mouth 2 (two) times daily for 7 days.  Dispense: 14 tablet; Refill: 0  I discussed the assessment and treatment plan with the patient. The patient was provided an opportunity to ask questions and all were answered. The patient agreed with the plan and demonstrated an understanding of the instructions.   The patient was advised to call back or seek an in-person evaluation if the symptoms worsen or if the condition fails to improve as anticipated.  I provided 22 minutes talking with patient and additional 5 minutes of charting.   Micheline Rough, MD

## 2019-08-14 ENCOUNTER — Telehealth (INDEPENDENT_AMBULATORY_CARE_PROVIDER_SITE_OTHER): Payer: BC Managed Care – PPO | Admitting: Family Medicine

## 2019-08-14 VITALS — BP 128/93

## 2019-08-14 DIAGNOSIS — K5792 Diverticulitis of intestine, part unspecified, without perforation or abscess without bleeding: Secondary | ICD-10-CM | POA: Diagnosis not present

## 2019-08-14 DIAGNOSIS — E119 Type 2 diabetes mellitus without complications: Secondary | ICD-10-CM | POA: Diagnosis not present

## 2019-08-14 DIAGNOSIS — E7849 Other hyperlipidemia: Secondary | ICD-10-CM | POA: Diagnosis not present

## 2019-08-14 DIAGNOSIS — I10 Essential (primary) hypertension: Secondary | ICD-10-CM

## 2019-08-14 NOTE — Progress Notes (Signed)
Virtual Visit via Video Note  I connected with Michael Jacobs on 08/14/19 at 11:00 AM EDT by a video enabled telemedicine application and verified that I am speaking with the correct person using two identifiers.  Location patient: home Location provider:work office Persons participating in the virtual visit: patient, provider  I discussed the limitations of evaluation and management by telemedicine and the availability of in person appointments. The patient expressed understanding and agreed to proceed.   Michael Jacobs DOB: 1971/01/20 Encounter date: 08/14/2019  This is a 49 y.o. male who presents with Chief Complaint  Patient presents with  . Abdominal Pain    History of present illness: Took medicine starting on Friday and started to feel better but sat ate something he shouldn't have eaten and then in more pain on Sunday. Changed diet on Monday to water, liquids and started to get better. Has continued with medication. Taking some advil for inflammation. (had eggs benedict and potatoes at breakfast). Hurt from 4pm-4am.   Finally had better bowel movement last night that was more than just mucous. No blood in stools. Had bowel movement even better today. Pain still coming between belly button , lower and in middle.   Weight down to 256. Drinking fluids, applesauce, pudding. bp was 128/93 this morning.   Allergies  Allergen Reactions  . Ace Inhibitors Cough  . Phenergan [Promethazine Hcl] Anxiety   No outpatient medications have been marked as taking for the 08/14/19 encounter (Video Visit) with Caren Macadam, MD.    Review of Systems  Constitutional: Negative for chills, fatigue and fever.  Respiratory: Negative for cough, chest tightness, shortness of breath and wheezing.   Cardiovascular: Negative for chest pain, palpitations and leg swelling.  Gastrointestinal: Positive for abdominal pain (improved). Negative for blood in stool, constipation, nausea and vomiting.   Genitourinary: Negative for difficulty urinating and dysuria.    Objective:  There were no vitals taken for this visit.      BP Readings from Last 3 Encounters:  03/22/19 (!) 136/91  01/11/19 (!) 138/100  02/28/17 (!) 120/92   Wt Readings from Last 3 Encounters:  05/03/19 262 lb (118.8 kg)  03/22/19 267 lb (121.1 kg)  03/04/19 267 lb 6.4 oz (121.3 kg)    Physical Exam   VITALS per patient if applicable:  GENERAL: alert, oriented, appears well and in no acute distress  HEENT: atraumatic, conjunctiva clear, no obvious abnormalities on inspection of external nose and ears  NECK: normal movements of the head and neck  LUNGS: on inspection no signs of respiratory distress, breathing rate appears normal, no obvious gross SOB, gasping or wheezing  CV: no obvious cyanosis  MS: moves all visible extremities without noticeable abnormality  PSYCH/NEURO: pleasant and cooperative, no obvious depression or anxiety, speech and thought processing grossly intact  Assessment/Plan  1. Diverticulitis Symptoms are improving. We discussed importance of seeking care with worsening of discomfort. He is planning to stay on a bland diet and work on liquids until he feels that he returns to baseline. Complete antibiotics and let me know if not continuing to improve by the end of the week.  2. Essential hypertension He will continue to monitor. Still slightly elevated, but improved from previous. - CBC with Differential/Platelet; Future - Comprehensive metabolic panel; Future  3. Type 2 diabetes mellitus without complication, without long-term current use of insulin ([place]) He is working on healthier eating overall. We will complete lab work once he is feeling better. - Hemoglobin A1c;  Future  4. Other hyperlipidemia - Lipid panel; Future    I discussed the assessment and treatment plan with the patient. The patient was provided an opportunity to ask questions and all were answered. The  patient agreed with the plan and demonstrated an understanding of the instructions.   The patient was advised to call back or seek an in-person evaluation if the symptoms worsen or if the condition fails to improve as anticipated.  I provided 20 minutes of non-face-to-face time during this encounter. 15 minutes spent for visit and 5 minutes for charting and follow-up discussion.     Micheline Rough, MD

## 2019-08-15 ENCOUNTER — Telehealth: Payer: Self-pay | Admitting: *Deleted

## 2019-08-15 NOTE — Telephone Encounter (Signed)
Spoke with the pt and scheduled appts as below.  

## 2019-08-15 NOTE — Telephone Encounter (Signed)
-----   Message from Caren Macadam, MD sent at 08/14/2019 11:44 AM EDT ----- Please set up lab visit and office visit to follow

## 2019-09-05 ENCOUNTER — Other Ambulatory Visit: Payer: Self-pay | Admitting: Family Medicine

## 2019-10-25 DIAGNOSIS — Z79899 Other long term (current) drug therapy: Secondary | ICD-10-CM | POA: Diagnosis not present

## 2019-10-25 DIAGNOSIS — F902 Attention-deficit hyperactivity disorder, combined type: Secondary | ICD-10-CM | POA: Diagnosis not present

## 2019-10-25 DIAGNOSIS — F419 Anxiety disorder, unspecified: Secondary | ICD-10-CM | POA: Diagnosis not present

## 2019-10-28 NOTE — Addendum Note (Signed)
Addended by: Marrion Coy on: 10/28/2019 08:52 AM   Modules accepted: Orders

## 2019-11-03 ENCOUNTER — Other Ambulatory Visit: Payer: Self-pay | Admitting: Family Medicine

## 2019-11-05 ENCOUNTER — Other Ambulatory Visit: Payer: BC Managed Care – PPO

## 2019-11-05 ENCOUNTER — Other Ambulatory Visit: Payer: Self-pay

## 2019-11-05 DIAGNOSIS — I10 Essential (primary) hypertension: Secondary | ICD-10-CM | POA: Diagnosis not present

## 2019-11-05 DIAGNOSIS — E119 Type 2 diabetes mellitus without complications: Secondary | ICD-10-CM | POA: Diagnosis not present

## 2019-11-05 DIAGNOSIS — E7849 Other hyperlipidemia: Secondary | ICD-10-CM

## 2019-11-06 LAB — COMPREHENSIVE METABOLIC PANEL
AG Ratio: 2 (calc) (ref 1.0–2.5)
ALT: 29 U/L (ref 9–46)
AST: 14 U/L (ref 10–40)
Albumin: 4.3 g/dL (ref 3.6–5.1)
Alkaline phosphatase (APISO): 68 U/L (ref 36–130)
BUN: 16 mg/dL (ref 7–25)
CO2: 24 mmol/L (ref 20–32)
Calcium: 9.7 mg/dL (ref 8.6–10.3)
Chloride: 107 mmol/L (ref 98–110)
Creat: 1.05 mg/dL (ref 0.60–1.35)
Globulin: 2.2 g/dL (calc) (ref 1.9–3.7)
Glucose, Bld: 124 mg/dL — ABNORMAL HIGH (ref 65–99)
Potassium: 4.6 mmol/L (ref 3.5–5.3)
Sodium: 140 mmol/L (ref 135–146)
Total Bilirubin: 0.6 mg/dL (ref 0.2–1.2)
Total Protein: 6.5 g/dL (ref 6.1–8.1)

## 2019-11-06 LAB — CBC WITH DIFFERENTIAL/PLATELET
Absolute Monocytes: 608 cells/uL (ref 200–950)
Basophils Absolute: 29 cells/uL (ref 0–200)
Basophils Relative: 0.3 %
Eosinophils Absolute: 152 cells/uL (ref 15–500)
Eosinophils Relative: 1.6 %
HCT: 47.9 % (ref 38.5–50.0)
Hemoglobin: 15.9 g/dL (ref 13.2–17.1)
Lymphs Abs: 3392 cells/uL (ref 850–3900)
MCH: 28.2 pg (ref 27.0–33.0)
MCHC: 33.2 g/dL (ref 32.0–36.0)
MCV: 85.1 fL (ref 80.0–100.0)
MPV: 11.3 fL (ref 7.5–12.5)
Monocytes Relative: 6.4 %
Neutro Abs: 5320 cells/uL (ref 1500–7800)
Neutrophils Relative %: 56 %
Platelets: 324 10*3/uL (ref 140–400)
RBC: 5.63 10*6/uL (ref 4.20–5.80)
RDW: 14.7 % (ref 11.0–15.0)
Total Lymphocyte: 35.7 %
WBC: 9.5 10*3/uL (ref 3.8–10.8)

## 2019-11-06 LAB — LIPID PANEL
Cholesterol: 174 mg/dL (ref <200)
HDL: 32 mg/dL — ABNORMAL LOW (ref >=40)
LDL Cholesterol (Calc): 105 mg/dL (calc) — ABNORMAL HIGH
Non-HDL Cholesterol (Calc): 142 mg/dL (calc) — ABNORMAL HIGH (ref <130)
Total CHOL/HDL Ratio: 5.4 (calc) — ABNORMAL HIGH (ref <5.0)
Triglycerides: 261 mg/dL — ABNORMAL HIGH (ref <150)

## 2019-11-06 LAB — HEMOGLOBIN A1C
Hgb A1c MFr Bld: 6.6 % of total Hgb — ABNORMAL HIGH (ref <5.7)
Mean Plasma Glucose: 143 (calc)
eAG (mmol/L): 7.9 (calc)

## 2019-11-13 ENCOUNTER — Ambulatory Visit (INDEPENDENT_AMBULATORY_CARE_PROVIDER_SITE_OTHER): Payer: BC Managed Care – PPO | Admitting: Family Medicine

## 2019-11-13 ENCOUNTER — Other Ambulatory Visit: Payer: Self-pay

## 2019-11-13 ENCOUNTER — Encounter: Payer: Self-pay | Admitting: Family Medicine

## 2019-11-13 VITALS — BP 128/80 | HR 77 | Temp 98.0°F | Ht 73.0 in | Wt 264.6 lb

## 2019-11-13 DIAGNOSIS — E1165 Type 2 diabetes mellitus with hyperglycemia: Secondary | ICD-10-CM

## 2019-11-13 DIAGNOSIS — I10 Essential (primary) hypertension: Secondary | ICD-10-CM

## 2019-11-13 DIAGNOSIS — E7849 Other hyperlipidemia: Secondary | ICD-10-CM | POA: Diagnosis not present

## 2019-11-13 MED ORDER — ROSUVASTATIN CALCIUM 10 MG PO TABS: 10.0000 mg | ORAL_TABLET | Freq: Every day | ORAL | 3 refills | Status: DC

## 2019-11-13 NOTE — Progress Notes (Signed)
Michael Jacobs DOB: 02-26-1971 Encounter date: 11/13/2019  This is a 49 y.o. male who presents with Chief Complaint  Patient presents with  . Follow-up    History of present illness: Doing well since recovering from diverticulitis.   Hypertension: Amlodipine 7.5 mg daily, losartan 100 mg daily. Has not been checking at home. Suspects down because started exercising, do more outside for soccer with daughter, and joined [employer].  Anxiety/ADHD: still follows with psychiatry for this.  Vyvanse 30 mg daily, Klonopin 0.5 mg at bedtime  Hyperlipidemia: Pravastatin 40 mg daily.  LDL slightly above goal on recent blood work.  Also taking fenofibrate 54 mg daily. Eating healthier, but LDL is still higher.   GERD:protonix 40mg control symptoms. controlled with medication. Repeats dose if needed.   Diabetes type 2: A1c 6.6 on recent bloodwork.   Wanting to get weight down to 230lb.   Allergies  Allergen Reactions  . Ace Inhibitors Cough  . Phenergan [Promethazine Hcl] Anxiety   Current Meds  Medication Sig  . albuterol (PROVENTIL HFA;VENTOLIN HFA) 108 (90 BASE) MCG/ACT inhaler Inhale 2 puffs into the lungs every 6 (six) hours as needed.  . ALPRAZolam (XANAX) 0.25 MG tablet Take 1 tablet (0.25 mg total) by mouth daily as needed for sleep or anxiety.  Marland Kitchen amLODipine (NORVASC) 5 MG tablet TAKE 1 AND 1/2 TABLET BY MOUTH DAILY, LAST REFILL **MUST CALL MD FOR APPOINTMENT  . clonazePAM (KLONOPIN) 0.5 MG tablet Take 1 mg by mouth at bedtime.  . fenofibrate 54 MG tablet TAKE ONE TABLET BY MOUTH DAILY  . JARDIANCE 10 MG TABS tablet TAKE ONE TABLET BY MOUTH DAILY BEFORE BREAKFAST  . losartan (COZAAR) 100 MG tablet TAKE ONE TABLET BY MOUTH DAILY  . metFORMIN (GLUCOPHAGE) 1000 MG tablet TAKE ONE TABLET BY MOUTH TWICE A DAY WITH A MEAL  . metroNIDAZOLE (FLAGYL) 500 MG tablet Take 1 tablet (500 mg total) by mouth 3 (three) times daily.  . Multiple Vitamins-Minerals (MENS MULTIVITAMIN PO)  Take 1 tablet by mouth daily.  . pantoprazole (PROTONIX) 40 MG tablet Take 1 tablet (40 mg total) by mouth 2 (two) times daily.  . sildenafil (REVATIO) 20 MG tablet TAKE 3-5 TABLETS AS NEEDED PRIOR TO SEXUAL INTERCOURSE. DO NOT TAKE MORE THAN 1 DOSE IN 24 HOURS  . VYVANSE 30 MG capsule Take 30 mg by mouth daily.  . [DISCONTINUED] pravastatin (PRAVACHOL) 40 MG tablet TAKE ONE TABLET BY MOUTH DAILY    Review of Systems  Constitutional: Negative for chills, fatigue and fever.  Respiratory: Negative for cough, chest tightness, shortness of breath and wheezing.   Cardiovascular: Negative for chest pain, palpitations and leg swelling.    Objective:  BP 128/80 (BP Location: Left Arm, Patient Position: Sitting, Cuff Size: Large)   Pulse 77   Temp 98 F (36.7 C) (Oral)   Ht 6\' 1"  (1.854 m)   Wt 264 lb 9.6 oz (120 kg)   BMI 34.91 kg/m   Weight: 264 lb 9.6 oz (120 kg)   BP Readings from Last 3 Encounters:  11/13/19 128/80  08/14/19 (!) 128/93  03/22/19 (!) 136/91   Wt Readings from Last 3 Encounters:  11/13/19 264 lb 9.6 oz (120 kg)  05/03/19 262 lb (118.8 kg)  03/22/19 267 lb (121.1 kg)    Physical Exam Constitutional:      General: He is not in acute distress.    Appearance: He is well-developed and overweight.  HENT:     Head: Normocephalic and atraumatic.  Cardiovascular:  Rate and Rhythm: Normal rate and regular rhythm.     Heart sounds: Normal heart sounds. No murmur heard.   Pulmonary:     Effort: Pulmonary effort is normal.     Breath sounds: Normal breath sounds.  Abdominal:     General: Bowel sounds are normal. There is no distension.     Palpations: Abdomen is soft.     Tenderness: There is no abdominal tenderness. There is no guarding.  Skin:    General: Skin is warm and dry.     Comments: Sensory exam of the foot is normal, tested with the monofilament. Good pulses, no lesions or ulcers, good peripheral pulses.  Psychiatric:        Judgment: Judgment  normal.     Assessment/Plan  1. Essential hypertension Well-controlled.  Continue current medications.  Suspect this will continue to improve as he works on increasing activity level and weight loss. - CBC with Differential/Platelet; Future - Comprehensive metabolic panel; Future  2. Type 2 diabetes mellitus with hyperglycemia, without long-term current use of insulin ([place]) Working on lower carbohydrate diet and regular exercise to help lower sugar level.  Blood sugars have been stable. - Hemoglobin A1c; Future - [facility] DIABETES FOOT EXAM  3. Other hyperlipidemia We are going to change from pravastatin to Crestor for hopefully better result of LDL lowering.  We will plan to recheck in 6 months time. - Lipid panel; Future    Return in about 6 months (around 05/15/2020) for physical exam with bloodwork prior, nurse visit: BP recheck.    Micheline Rough, MD

## 2019-12-07 ENCOUNTER — Other Ambulatory Visit: Payer: Self-pay | Admitting: Family Medicine

## 2019-12-24 ENCOUNTER — Encounter: Payer: Self-pay | Admitting: Family Medicine

## 2019-12-24 DIAGNOSIS — R0683 Snoring: Secondary | ICD-10-CM

## 2019-12-25 ENCOUNTER — Encounter: Payer: Self-pay | Admitting: Family Medicine

## 2019-12-25 ENCOUNTER — Telehealth (INDEPENDENT_AMBULATORY_CARE_PROVIDER_SITE_OTHER): Payer: BC Managed Care – PPO | Admitting: Family Medicine

## 2019-12-25 VITALS — HR 88

## 2019-12-25 DIAGNOSIS — R05 Cough: Secondary | ICD-10-CM

## 2019-12-25 DIAGNOSIS — R059 Cough, unspecified: Secondary | ICD-10-CM

## 2019-12-25 DIAGNOSIS — R0989 Other specified symptoms and signs involving the circulatory and respiratory systems: Secondary | ICD-10-CM

## 2019-12-25 NOTE — Addendum Note (Signed)
Addended by: Caren Macadam on: 12/25/2019 10:59 AM   Modules accepted: Orders

## 2019-12-25 NOTE — Progress Notes (Signed)
Patient ID: Michael Jacobs, male   DOB: [birth year], 49 y.o.   MRN: [id number]  This visit type was conducted due to national recommendations for restrictions regarding the COVID-19 pandemic in an effort to limit this patient's exposure and mitigate transmission in our community.   Virtual Visit via Video Note  I connected with Manus Gunning on 12/25/19 at 11:15 AM EDT by a video enabled telemedicine application and verified that I am speaking with the correct person using two identifiers.  Location patient: home Location provider:work or home office Persons participating in the virtual visit: patient, provider  I discussed the limitations of evaluation and management by telemedicine and the availability of in person appointments. The patient expressed understanding and agreed to proceed.   HPI: Michael Jacobs called with onset Monday of some cough and nasal congestion.  He also had low-grade fever 100.6.  Has had some headaches and malaise.  Last weekend he went to [place] for a soccer tournament.  Was not aware of any obvious sick contacts then.  However, late last week he was around his parents who were diagnosed with Covid about 2-1/2 to 3 weeks ago.  He felt like they were probably past the quarantine.  Michael Jacobs has had Covid vaccine with [employer].  He is concerned because of his comorbidities of obesity, type 2 diabetes, hypertension.  He went yesterday to [facility] and had Covid test done which is still pending.  He hopes to hear back later today.  No major dyspnea at rest.  No nausea or vomiting.  No diarrhea.   ROS: See pertinent positives and negatives per HPI.  Past Medical History:  Diagnosis Date  . Anxiety   . Asthma   . Cecal ulcer   . Diabetes mellitus ([place]) 2017  . GERD (gastroesophageal reflux disease)   . Hiatal hernia   . Hypercholesterolemia   . Hypertension     Past Surgical History:  Procedure Laterality Date  . COLONOSCOPY  2015  . lasik Bilateral   . POLYPECTOMY    .  TONSILLECTOMY AND ADENOIDECTOMY    . UPPER GASTROINTESTINAL ENDOSCOPY    . WISDOM TOOTH EXTRACTION      Family History  Problem Relation Age of Onset  . Colon polyps Mother   . High blood pressure Mother   . High Cholesterol Mother        triglycerides  . Aneurysm Father        brain  . Colon cancer Maternal Grandmother   . Diabetes Maternal Grandmother   . Heart disease Maternal Grandmother   . Colon polyps Maternal Grandmother   . Arthritis Maternal Grandmother   . Breast cancer Paternal Grandmother   . Stomach cancer Paternal Grandmother        mets to stomach  . Lung cancer Paternal Grandmother   . Prostate cancer Maternal Grandfather   . High blood pressure Maternal Grandfather   . Obesity Sister   . Obesity Brother   . Esophageal cancer Neg Hx   . Rectal cancer Neg Hx     SOCIAL HX: Non-smoker   Current Outpatient Medications:  .  albuterol (PROVENTIL HFA;VENTOLIN HFA) 108 (90 BASE) MCG/ACT inhaler, Inhale 2 puffs into the lungs every 6 (six) hours as needed., Disp: 8.5 g, Rfl: 5 .  ALPRAZolam (XANAX) 0.25 MG tablet, Take 1 tablet (0.25 mg total) by mouth daily as needed for sleep or anxiety., Disp: 10 tablet, Rfl: 0 .  amLODipine (NORVASC) 5 MG tablet, TAKE 1 AND 1/2 TABLET BY  MOUTH DAILY, LAST REFILL **MUST CALL MD FOR APPOINTMENT, Disp: 135 tablet, Rfl: 0 .  clonazePAM (KLONOPIN) 0.5 MG tablet, Take 1 mg by mouth at bedtime., Disp: , Rfl:  .  fenofibrate 54 MG tablet, TAKE ONE TABLET BY MOUTH DAILY, Disp: 90 tablet, Rfl: 0 .  JARDIANCE 10 MG TABS tablet, TAKE ONE TABLET BY MOUTH DAILY BEFORE BREAKFAST, Disp: 90 tablet, Rfl: 0 .  losartan (COZAAR) 100 MG tablet, TAKE ONE TABLET BY MOUTH DAILY, Disp: 90 tablet, Rfl: 1 .  metFORMIN (GLUCOPHAGE) 1000 MG tablet, TAKE ONE TABLET BY MOUTH TWICE A DAY WITH A MEAL, Disp: 180 tablet, Rfl: 0 .  Multiple Vitamins-Minerals (MENS MULTIVITAMIN PO), Take 1 tablet by mouth daily., Disp: , Rfl:  .  pantoprazole (PROTONIX) 40 MG  tablet, Take 1 tablet (40 mg total) by mouth 2 (two) times daily., Disp: 180 tablet, Rfl: 1 .  rosuvastatin (CRESTOR) 10 MG tablet, Take 1 tablet (10 mg total) by mouth daily., Disp: 90 tablet, Rfl: 3 .  sildenafil (REVATIO) 20 MG tablet, TAKE 3-5 TABLETS AS NEEDED PRIOR TO SEXUAL INTERCOURSE. DO NOT TAKE MORE THAN 1 DOSE IN 24 HOURS, Disp: 90 tablet, Rfl: 2 .  VYVANSE 30 MG capsule, Take 30 mg by mouth daily., Disp: , Rfl:   EXAM:  VITALS per patient if applicable:  GENERAL: alert, oriented, appears well and in no acute distress  HEENT: atraumatic, conjunttiva clear, no obvious abnormalities on inspection of external nose and ears  NECK: normal movements of the head and neck  LUNGS: on inspection no signs of respiratory distress, breathing rate appears normal, no obvious gross SOB, gasping or wheezing  CV: no obvious cyanosis  MS: moves all visible extremities without noticeable abnormality  PSYCH/NEURO: pleasant and cooperative, no obvious depression or anxiety, speech and thought processing grossly intact  ASSESSMENT AND PLAN:  Discussed the following assessment and plan:  3-day history of upper respiratory symptoms of nasal congestion, cough, low-grade fever, headache, malaise.  Rule out Covid infection.  Covid PCR test pending as above.  -We recommend he promptly notifies [place] after his results are back.  If Covid positive would recommend referral for consideration for monoclonal antibody infusion since his symptoms have worsened past couple days and this needs to be offered early in the course if possible. -Plenty of fluids and rest in the meantime.  He knows to stay quarantined until further clarified     I discussed the assessment and treatment plan with the patient. The patient was provided an opportunity to ask questions and all were answered. The patient agreed with the plan and demonstrated an understanding of the instructions.   The patient was advised to call back or  seek an in-person evaluation if the symptoms worsen or if the condition fails to improve as anticipated.     Carolann Littler, MD

## 2019-12-25 NOTE — Telephone Encounter (Signed)
Spoke with the pt and scheduled a virtual visit for today with Dr Elease Hashimoto as PCP is out of the office.  Patient stated his wife is concerned with him snoring and feels he needs a CPAP.  I also advised the pt to schedule an appt with Dr Ethlyn Gallery to discuss snoring and if a CPAP is needed and he agreed.

## 2019-12-26 ENCOUNTER — Encounter: Payer: Self-pay | Admitting: Family Medicine

## 2019-12-26 ENCOUNTER — Telehealth: Payer: Self-pay | Admitting: *Deleted

## 2019-12-26 ENCOUNTER — Other Ambulatory Visit: Payer: Self-pay | Admitting: *Deleted

## 2019-12-26 DIAGNOSIS — R0989 Other specified symptoms and signs involving the circulatory and respiratory systems: Secondary | ICD-10-CM

## 2019-12-26 DIAGNOSIS — R059 Cough, unspecified: Secondary | ICD-10-CM

## 2019-12-26 MED ORDER — ALBUTEROL SULFATE HFA 108 (90 BASE) MCG/ACT IN AERS: 2.0000 | INHALATION_SPRAY | Freq: Four times a day (QID) | RESPIRATORY_TRACT | 2 refills | Status: AC | PRN

## 2019-12-26 NOTE — Telephone Encounter (Signed)
Patient called and states that he tested positive for COVID19 and Dr. Elease Hashimoto wanted him to get the infusion. Dr. Elease Hashimoto advised him to call him on his cell phone and that is when Dr.Burchette advised him to get the infusion. He is trying to follow up on how to get an order for the infusion.  Clinic RN sent a secure chat to [facility] with patient information attached. Per Janine Ores, RN the team willdd him to the list to be called and screened.

## 2019-12-27 ENCOUNTER — Encounter: Payer: Self-pay | Admitting: Family Medicine

## 2019-12-27 ENCOUNTER — Other Ambulatory Visit: Payer: Self-pay | Admitting: Physician Assistant

## 2019-12-27 DIAGNOSIS — U071 COVID-19: Secondary | ICD-10-CM

## 2019-12-27 DIAGNOSIS — I10 Essential (primary) hypertension: Secondary | ICD-10-CM

## 2019-12-27 DIAGNOSIS — E1165 Type 2 diabetes mellitus with hyperglycemia: Secondary | ICD-10-CM

## 2019-12-27 NOTE — Telephone Encounter (Signed)
See prior message

## 2019-12-27 NOTE — Telephone Encounter (Signed)
Pt has already been referred to monoclonal antibody clinic

## 2019-12-27 NOTE — Progress Notes (Signed)
I connected by phone with Michael Jacobs on 12/27/2019 at 6:58 AM to discuss the potential use of a new treatment for mild to moderate COVID-19 viral infection in non-hospitalized patients.  This patient is a 49 y.o. male that meets the FDA criteria for Emergency Use Authorization of COVID monoclonal antibody casirivimab/imdevimab.  Has a (+) direct SARS-CoV-2 viral test result  Has mild or moderate COVID-19   Is NOT hospitalized due to COVID-19  Is within 10 days of symptom onset  Has at least one of the high risk factor(s) for progression to severe COVID-19 and/or hospitalization as defined in EUA.  Specific high risk criteria : BMI > 25, Diabetes and Cardiovascular disease or hypertension   I have spoken and communicated the following to the patient or parent/caregiver regarding COVID monoclonal antibody treatment:  1. FDA has authorized the emergency use for the treatment of mild to moderate COVID-19 in adults and pediatric patients with positive results of direct SARS-CoV-2 viral testing who are 64 years of age and older weighing at least 40 kg, and who are at high risk for progressing to severe COVID-19 and/or hospitalization.  2. The significant known and potential risks and benefits of COVID monoclonal antibody, and the extent to which such potential risks and benefits are unknown.  3. Information on available alternative treatments and the risks and benefits of those alternatives, including clinical trials.  4. Patients treated with COVID monoclonal antibody should continue to self-isolate and use infection control measures (e.g., wear mask, isolate, social distance, avoid sharing personal items, clean and disinfect "high touch" surfaces, and frequent handwashing) according to CDC guidelines.   5. The patient or parent/caregiver has the option to accept or refuse COVID monoclonal antibody treatment.  After reviewing this information with the patient, The patient agreed to  proceed with receiving casirivimab\imdevimab infusion and will be provided a copy of the Fact sheet prior to receiving the infusion.  Sx onset 9/11. Set up for infusion on 9/18 @ 8:30am. Directions given to [facility]. Pt is aware that insurance will be charged an infusion fee.   Angelena Form 12/27/2019 6:58 AM

## 2019-12-28 ENCOUNTER — Ambulatory Visit (HOSPITAL_COMMUNITY)
Admission: RE | Admit: 2019-12-28 | Discharge: 2019-12-28 | Disposition: A | Discharge status: Home / Self Care | Payer: BC Managed Care – PPO | Source: Ambulatory Visit | Attending: Pulmonary Disease | Admitting: Pulmonary Disease

## 2019-12-28 ENCOUNTER — Other Ambulatory Visit (HOSPITAL_COMMUNITY): Payer: Self-pay

## 2019-12-28 DIAGNOSIS — E1165 Type 2 diabetes mellitus with hyperglycemia: Secondary | ICD-10-CM | POA: Diagnosis not present

## 2019-12-28 DIAGNOSIS — U071 COVID-19: Secondary | ICD-10-CM

## 2019-12-28 DIAGNOSIS — I10 Essential (primary) hypertension: Secondary | ICD-10-CM | POA: Diagnosis not present

## 2019-12-28 MED ORDER — DIPHENHYDRAMINE HCL 50 MG/ML IJ SOLN: 50.0000 mg | Freq: Once | INTRAMUSCULAR | Status: DC | PRN

## 2019-12-28 MED ORDER — FAMOTIDINE IN NACL 20-0.9 MG/50ML-% IV SOLN: 20.0000 mg | Freq: Once | INTRAVENOUS | Status: DC | PRN

## 2019-12-28 MED ORDER — SODIUM CHLORIDE 0.9 % IV SOLN: INTRAVENOUS | Status: DC | PRN

## 2019-12-28 MED ORDER — ALBUTEROL SULFATE HFA 108 (90 BASE) MCG/ACT IN AERS: 2.0000 | INHALATION_SPRAY | Freq: Once | RESPIRATORY_TRACT | Status: DC | PRN

## 2019-12-28 MED ORDER — EPINEPHRINE 0.3 MG/0.3ML IJ SOAJ: 0.3000 mg | Freq: Once | INTRAMUSCULAR | Status: DC | PRN

## 2019-12-28 MED ORDER — SODIUM CHLORIDE 0.9 % IV SOLN
1200.0000 mg | Freq: Once | INTRAVENOUS | Status: AC
  Administered 2019-12-28: 1200 mg via INTRAVENOUS

## 2019-12-28 MED ORDER — METHYLPREDNISOLONE SODIUM SUCC 125 MG IJ SOLR: 125.0000 mg | Freq: Once | INTRAMUSCULAR | Status: DC | PRN

## 2019-12-28 NOTE — Discharge Instructions (Signed)

## 2019-12-28 NOTE — Progress Notes (Signed)
  Diagnosis: COVID-19  Physician:Dr Wright  Procedure: [facility] Med: casirivimab\imdevimab infusion - Provided patient with casirivimab\imdevimab fact sheet for patients, parents and caregivers prior to infusion.  Complications: No immediate complications noted.  Discharge: Discharged home   Michael Jacobs 12/28/2019  

## 2019-12-30 ENCOUNTER — Other Ambulatory Visit: Payer: Self-pay | Admitting: Family Medicine

## 2019-12-31 ENCOUNTER — Other Ambulatory Visit: Payer: Self-pay | Admitting: Family Medicine

## 2019-12-31 MED ORDER — AMLODIPINE BESYLATE 5 MG PO TABS: ORAL_TABLET | ORAL | 0 refills | Status: DC

## 2019-12-31 MED ORDER — FENOFIBRATE 54 MG PO TABS
54.0000 mg | ORAL_TABLET | Freq: Every day | ORAL | 0 refills | Status: DC
Start: 2019-12-31 — End: 2020-05-21

## 2019-12-31 NOTE — Telephone Encounter (Signed)
Please the below Rx request

## 2020-01-28 DIAGNOSIS — F419 Anxiety disorder, unspecified: Secondary | ICD-10-CM | POA: Diagnosis not present

## 2020-01-28 DIAGNOSIS — F902 Attention-deficit hyperactivity disorder, combined type: Secondary | ICD-10-CM | POA: Diagnosis not present

## 2020-01-28 DIAGNOSIS — Z79899 Other long term (current) drug therapy: Secondary | ICD-10-CM | POA: Diagnosis not present

## 2020-02-02 ENCOUNTER — Other Ambulatory Visit: Payer: Self-pay | Admitting: Family Medicine

## 2020-03-09 ENCOUNTER — Other Ambulatory Visit: Payer: Self-pay | Admitting: Family Medicine

## 2020-03-16 ENCOUNTER — Encounter: Payer: Self-pay | Admitting: Family Medicine

## 2020-03-17 ENCOUNTER — Other Ambulatory Visit: Payer: Self-pay | Admitting: Family Medicine

## 2020-03-17 DIAGNOSIS — K5792 Diverticulitis of intestine, part unspecified, without perforation or abscess without bleeding: Secondary | ICD-10-CM

## 2020-03-17 MED ORDER — METRONIDAZOLE 500 MG PO TABS: 500.0000 mg | ORAL_TABLET | Freq: Three times a day (TID) | ORAL | 0 refills | Status: DC

## 2020-03-17 MED ORDER — CIPROFLOXACIN HCL 500 MG PO TABS: 500.0000 mg | ORAL_TABLET | Freq: Two times a day (BID) | ORAL | 0 refills | Status: DC

## 2020-03-19 DIAGNOSIS — M7541 Impingement syndrome of right shoulder: Secondary | ICD-10-CM | POA: Diagnosis not present

## 2020-03-19 DIAGNOSIS — M25512 Pain in left shoulder: Secondary | ICD-10-CM | POA: Diagnosis not present

## 2020-03-19 DIAGNOSIS — M79644 Pain in right finger(s): Secondary | ICD-10-CM | POA: Diagnosis not present

## 2020-04-22 DIAGNOSIS — H25013 Cortical age-related cataract, bilateral: Secondary | ICD-10-CM | POA: Diagnosis not present

## 2020-04-22 DIAGNOSIS — E119 Type 2 diabetes mellitus without complications: Secondary | ICD-10-CM | POA: Diagnosis not present

## 2020-04-22 DIAGNOSIS — Z9889 Other specified postprocedural states: Secondary | ICD-10-CM | POA: Diagnosis not present

## 2020-04-22 DIAGNOSIS — H35413 Lattice degeneration of retina, bilateral: Secondary | ICD-10-CM | POA: Diagnosis not present

## 2020-04-23 ENCOUNTER — Other Ambulatory Visit: Payer: Self-pay

## 2020-04-23 ENCOUNTER — Encounter (INDEPENDENT_AMBULATORY_CARE_PROVIDER_SITE_OTHER): Payer: PRIVATE HEALTH INSURANCE | Admitting: Ophthalmology

## 2020-04-23 DIAGNOSIS — D2261 Melanocytic nevi of right upper limb, including shoulder: Secondary | ICD-10-CM | POA: Diagnosis not present

## 2020-04-23 DIAGNOSIS — I10 Essential (primary) hypertension: Secondary | ICD-10-CM

## 2020-04-23 DIAGNOSIS — H35033 Hypertensive retinopathy, bilateral: Secondary | ICD-10-CM

## 2020-04-23 DIAGNOSIS — D485 Neoplasm of uncertain behavior of skin: Secondary | ICD-10-CM | POA: Diagnosis not present

## 2020-04-23 DIAGNOSIS — H35413 Lattice degeneration of retina, bilateral: Secondary | ICD-10-CM

## 2020-04-23 DIAGNOSIS — H33303 Unspecified retinal break, bilateral: Secondary | ICD-10-CM

## 2020-04-23 DIAGNOSIS — H43813 Vitreous degeneration, bilateral: Secondary | ICD-10-CM | POA: Diagnosis not present

## 2020-04-23 DIAGNOSIS — D2262 Melanocytic nevi of left upper limb, including shoulder: Secondary | ICD-10-CM | POA: Diagnosis not present

## 2020-04-23 DIAGNOSIS — D225 Melanocytic nevi of trunk: Secondary | ICD-10-CM | POA: Diagnosis not present

## 2020-04-23 DIAGNOSIS — L858 Other specified epidermal thickening: Secondary | ICD-10-CM | POA: Diagnosis not present

## 2020-04-27 ENCOUNTER — Other Ambulatory Visit: Payer: Self-pay | Admitting: Family Medicine

## 2020-04-27 ENCOUNTER — Encounter: Payer: Self-pay | Admitting: Family Medicine

## 2020-04-28 NOTE — Addendum Note (Signed)
Addended by: Marrion Coy on: 04/28/2020 02:06 PM   Modules accepted: Orders

## 2020-04-30 ENCOUNTER — Encounter (INDEPENDENT_AMBULATORY_CARE_PROVIDER_SITE_OTHER): Payer: BC Managed Care – PPO | Admitting: Ophthalmology

## 2020-04-30 ENCOUNTER — Other Ambulatory Visit: Payer: Self-pay

## 2020-04-30 DIAGNOSIS — H33302 Unspecified retinal break, left eye: Secondary | ICD-10-CM

## 2020-05-13 ENCOUNTER — Other Ambulatory Visit: Payer: Self-pay

## 2020-05-13 ENCOUNTER — Other Ambulatory Visit (INDEPENDENT_AMBULATORY_CARE_PROVIDER_SITE_OTHER): Payer: BC Managed Care – PPO

## 2020-05-13 DIAGNOSIS — E7849 Other hyperlipidemia: Secondary | ICD-10-CM

## 2020-05-13 DIAGNOSIS — I10 Essential (primary) hypertension: Secondary | ICD-10-CM | POA: Diagnosis not present

## 2020-05-13 DIAGNOSIS — E1165 Type 2 diabetes mellitus with hyperglycemia: Secondary | ICD-10-CM | POA: Diagnosis not present

## 2020-05-13 LAB — CBC WITH DIFFERENTIAL/PLATELET
Basophils Absolute: 0 10*3/uL (ref 0.0–0.1)
Basophils Relative: 0.5 % (ref 0.0–3.0)
Eosinophils Absolute: 0.1 10*3/uL (ref 0.0–0.7)
Eosinophils Relative: 1 % (ref 0.0–5.0)
HCT: 47.4 % (ref 39.0–52.0)
Hemoglobin: 16 g/dL (ref 13.0–17.0)
Lymphocytes Relative: 37.4 % (ref 12.0–46.0)
Lymphs Abs: 3.4 10*3/uL (ref 0.7–4.0)
MCHC: 33.6 g/dL (ref 30.0–36.0)
MCV: 87.3 fl (ref 78.0–100.0)
Monocytes Absolute: 0.6 10*3/uL (ref 0.1–1.0)
Monocytes Relative: 6.8 % (ref 3.0–12.0)
Neutro Abs: 4.9 10*3/uL (ref 1.4–7.7)
Neutrophils Relative %: 54.3 % (ref 43.0–77.0)
Platelets: 303 10*3/uL (ref 150.0–400.0)
RBC: 5.43 Mil/uL (ref 4.22–5.81)
RDW: 14.4 % (ref 11.5–15.5)
WBC: 9 10*3/uL (ref 4.0–10.5)

## 2020-05-13 LAB — COMPREHENSIVE METABOLIC PANEL
ALT: 33 U/L (ref 0–53)
AST: 17 U/L (ref 0–37)
Albumin: 4.5 g/dL (ref 3.5–5.2)
Alkaline Phosphatase: 63 U/L (ref 39–117)
BUN: 19 mg/dL (ref 6–23)
CO2: 30 mEq/L (ref 19–32)
Calcium: 10.5 mg/dL (ref 8.4–10.5)
Chloride: 103 mEq/L (ref 96–112)
Creatinine, Ser: 0.99 mg/dL (ref 0.40–1.50)
GFR: 90.62 mL/min (ref >60.00)
Glucose, Bld: 118 mg/dL — ABNORMAL HIGH (ref 70–99)
Potassium: 4.3 mEq/L (ref 3.5–5.1)
Sodium: 140 mEq/L (ref 135–145)
Total Bilirubin: 0.8 mg/dL (ref 0.2–1.2)
Total Protein: 6.8 g/dL (ref 6.0–8.3)

## 2020-05-13 LAB — LIPID PANEL
Cholesterol: 119 mg/dL (ref 0–200)
HDL: 35.5 mg/dL — ABNORMAL LOW (ref >39.00)
LDL Cholesterol: 45 mg/dL (ref 0–99)
NonHDL: 83.1
Total CHOL/HDL Ratio: 3
Triglycerides: 192 mg/dL — ABNORMAL HIGH (ref 0.0–149.0)
VLDL: 38.4 mg/dL (ref 0.0–40.0)

## 2020-05-13 LAB — HEMOGLOBIN A1C: Hgb A1c MFr Bld: 7 % — ABNORMAL HIGH (ref 4.6–6.5)

## 2020-05-14 ENCOUNTER — Encounter (INDEPENDENT_AMBULATORY_CARE_PROVIDER_SITE_OTHER): Payer: BC Managed Care – PPO | Admitting: Ophthalmology

## 2020-05-14 DIAGNOSIS — H33303 Unspecified retinal break, bilateral: Secondary | ICD-10-CM

## 2020-05-15 ENCOUNTER — Encounter (INDEPENDENT_AMBULATORY_CARE_PROVIDER_SITE_OTHER): Payer: PRIVATE HEALTH INSURANCE | Admitting: Ophthalmology

## 2020-05-18 DIAGNOSIS — D485 Neoplasm of uncertain behavior of skin: Secondary | ICD-10-CM | POA: Diagnosis not present

## 2020-05-18 DIAGNOSIS — L988 Other specified disorders of the skin and subcutaneous tissue: Secondary | ICD-10-CM | POA: Diagnosis not present

## 2020-05-19 ENCOUNTER — Encounter: Payer: Self-pay | Admitting: Pulmonary Disease

## 2020-05-19 ENCOUNTER — Other Ambulatory Visit: Payer: Self-pay

## 2020-05-19 ENCOUNTER — Ambulatory Visit (INDEPENDENT_AMBULATORY_CARE_PROVIDER_SITE_OTHER): Payer: BC Managed Care – PPO | Admitting: Pulmonary Disease

## 2020-05-19 VITALS — BP 124/84 | HR 98 | Temp 98.0°F | Ht 72.0 in | Wt 257.0 lb

## 2020-05-19 DIAGNOSIS — G4733 Obstructive sleep apnea (adult) (pediatric): Secondary | ICD-10-CM | POA: Diagnosis not present

## 2020-05-19 NOTE — Progress Notes (Signed)
Michael Jacobs    [id number]    [birth year]  Primary Care Physician:Koberlein, Steele Berg, MD  Referring Physician: Caren Macadam, MD [place],  [place] [address]  Chief complaint:  Patient with a history of snoring, witnessed apneas  HPI:  He states he wakes himself up sometimes when he snores a lot Loud snoring Witnessed apneas Dryness of his mouth in the mornings Usually goes to bed between 10 and 12 About 30 to 45 minutes to fall asleep 6-7 awakenings Final wake up time between 8 AM and 9 AM  He has lost about 50 pounds recently from going on a diet This is related to concerns about diabetes  Dad did have obstructive sleep apnea  Admits to dryness of his mouth in the mornings, occasional night sweats Occasional headaches  Non-smoker Hypertension is controlled   Outpatient Encounter Medications as of 05/19/2020  Medication Sig  . albuterol (VENTOLIN HFA) 108 (90 Base) MCG/ACT inhaler Inhale 2 puffs into the lungs every 6 (six) hours as needed.  . ALPRAZolam (XANAX) 0.25 MG tablet Take 1 tablet (0.25 mg total) by mouth daily as needed for sleep or anxiety.  Marland Kitchen amLODipine (NORVASC) 5 MG tablet TAKE 1 AND 1/2 TABLET BY MOUTH DAILY, LAST REFILL **MUST CALL MD FOR APPOINTMENT  . clonazePAM (KLONOPIN) 0.5 MG tablet Take 1 mg by mouth at bedtime.  . fenofibrate 54 MG tablet Take 1 tablet (54 mg total) by mouth daily.  Marland Kitchen JARDIANCE 10 MG TABS tablet TAKE ONE TABLET BY MOUTH DAILY BEFORE BREAKFAST  . losartan (COZAAR) 100 MG tablet TAKE ONE TABLET BY MOUTH DAILY  . metFORMIN (GLUCOPHAGE) 1000 MG tablet TAKE ONE TABLET BY MOUTH TWICE A DAY WITH MEALS  . pantoprazole (PROTONIX) 40 MG tablet Take 1 tablet (40 mg total) by mouth 2 (two) times daily.  . rosuvastatin (CRESTOR) 10 MG tablet Take 1 tablet (10 mg total) by mouth daily.  . sildenafil (REVATIO) 20 MG tablet TAKE 3-5 TABLETS AS NEEDED PRIOR TO SEXUAL INTERCOURSE, DO NOT TAKE MORE THAN 1  DOSE IN 24 HOURS, **MUST CALL MD FOR APPOINTMENT WITH NEW PCP  . VYVANSE 30 MG capsule Take 30 mg by mouth daily.  . ciprofloxacin (CIPRO) 500 MG tablet Take 1 tablet (500 mg total) by mouth 2 (two) times daily. (Patient not taking: Reported on 05/19/2020)  . metroNIDAZOLE (FLAGYL) 500 MG tablet Take 1 tablet (500 mg total) by mouth 3 (three) times daily. (Patient not taking: Reported on 05/19/2020)  . Multiple Vitamins-Minerals (MENS MULTIVITAMIN PO) Take 1 tablet by mouth daily. (Patient not taking: Reported on 05/19/2020)   No facility-administered encounter medications on file as of 05/19/2020.    Allergies as of 05/19/2020 - Review Complete 05/19/2020  Allergen Reaction Noted  . Ace inhibitors Cough 03/07/2012  . Phenergan [promethazine hcl] Anxiety 02/06/2014    Past Medical History:  Diagnosis Date  . Anxiety   . Asthma   . Cecal ulcer   . Diabetes mellitus ([place]) 2017  . GERD (gastroesophageal reflux disease)   . Hiatal hernia   . Hypercholesterolemia   . Hypertension     Past Surgical History:  Procedure Laterality Date  . COLONOSCOPY  2015  . lasik Bilateral   . POLYPECTOMY    . TONSILLECTOMY AND ADENOIDECTOMY    . UPPER GASTROINTESTINAL ENDOSCOPY    . WISDOM TOOTH EXTRACTION      Family History  Problem Relation Age of Onset  .  Colon polyps Mother   . High blood pressure Mother   . High Cholesterol Mother        triglycerides  . Aneurysm Father        brain  . Colon cancer Maternal Grandmother   . Diabetes Maternal Grandmother   . Heart disease Maternal Grandmother   . Colon polyps Maternal Grandmother   . Arthritis Maternal Grandmother   . Breast cancer Paternal Grandmother   . Stomach cancer Paternal Grandmother        mets to stomach  . Lung cancer Paternal Grandmother   . Prostate cancer Maternal Grandfather   . High blood pressure Maternal Grandfather   . Obesity Sister   . Obesity Brother   . Esophageal cancer Neg Hx   . Rectal cancer Neg Hx      Social History   Socioeconomic History  . Marital status: Married    Spouse name: Not on file  . Number of children: 2  . Years of education: Not on file  . Highest education level: Not on file  Occupational History    Employer: [employer]  Tobacco Use  . Smoking status: Former Smoker    Packs/day: 0.25    Years: 3.00    Pack years: 0.75    Types: Cigarettes, Cigars  . Smokeless tobacco: Never Used  Vaping Use  . Vaping Use: Never used  Substance and Sexual Activity  . Alcohol use: Yes    Alcohol/week: 3.0 standard drinks    Types: 3 Glasses of wine per week  . Drug use: No  . Sexual activity: Not on file  Other Topics Concern  . Not on file  Social History Narrative  . Not on file   Social Determinants of Health   Financial Resource Strain: Not on file  Food Insecurity: Not on file  Transportation Needs: Not on file  Physical Activity: Not on file  Stress: Not on file  Social Connections: Not on file  Intimate Partner Violence: Not on file    Review of Systems  Respiratory: Negative for apnea.   Psychiatric/Behavioral: Positive for sleep disturbance.    Vitals:   05/19/20 1113  BP: 124/84  Pulse: 98  Temp: 98 F (36.7 C)  SpO2: 97%     Physical Exam Constitutional:      Appearance: He is obese.  HENT:     Head: Normocephalic and atraumatic.     Mouth/Throat:     Mouth: Mucous membranes are moist.     Comments: Mallampati 4, crowded oropharynx Eyes:     Pupils: Pupils are equal, round, and reactive to light.  Neck:     Comments: Neck is thick Cardiovascular:     Rate and Rhythm: Normal rate and regular rhythm.     Heart sounds: No murmur heard. No friction rub.  Pulmonary:     Effort: No respiratory distress.     Breath sounds: No stridor. No wheezing or rhonchi.  Musculoskeletal:     Cervical back: No rigidity or tenderness.  Neurological:     Mental Status: He is alert.  Psychiatric:        Mood and Affect: Mood normal.     Results of the Epworth flowsheet 05/19/2020  Sitting and reading 0  Watching TV 1  Sitting, inactive in a public place (e.g. a theatre or a meeting) 0  As a passenger in a car for an hour without a break 1  Lying down to rest in the afternoon when circumstances  permit 1  Sitting and talking to someone 0  Sitting quietly after a lunch without alcohol 0  In a car, while stopped for a few minutes in traffic 0  Total score 3   Assessment:  Moderate probability of significant obstructive sleep apnea with witnessed apneas, significant snoring Body habitus  He has lost 50 pounds recently and this will continue to help sleep apnea severity  Pathophysiology of sleep disordered breathing discussed Treatment options discussed  Plan/Recommendations: Will schedule patient for home sleep study  Treatment options will be initiated once study reviewed  Tentative follow-up in about 3 to 4 months  Encouraged to continue weight loss journey   Sherrilyn Rist MD [facility] 05/19/2020, 11:35 AM  CC: Caren Macadam, MD

## 2020-05-19 NOTE — Patient Instructions (Signed)
Moderate probability of significant obstructive sleep apnea  We will schedule you for home sleep study Call you with results as soon as reviewed  Treatment options as we discussed  Tentative follow-up 1 to 2 months following initiation of treatment Sleep Apnea Sleep apnea affects breathing during sleep. It causes breathing to stop for a short time or to become shallow. It can also increase the risk of:  Heart attack.  Stroke.  Being very overweight (obese).  Diabetes.  Heart failure.  Irregular heartbeat. The goal of treatment is to help you breathe normally again. What are the causes? There are three kinds of sleep apnea:  Obstructive sleep apnea. This is caused by a blocked or collapsed airway.  Central sleep apnea. This happens when the brain does not send the right signals to the muscles that control breathing.  Mixed sleep apnea. This is a combination of obstructive and central sleep apnea. The most common cause of this condition is a collapsed or blocked airway. This can happen if:  Your throat muscles are too relaxed.  Your tongue and tonsils are too large.  You are overweight.  Your airway is too small.   What increases the risk?  Being overweight.  Smoking.  Having a small airway.  Being older.  Being male.  Drinking alcohol.  Taking medicines to calm yourself (sedatives or tranquilizers).  Having family members with the condition. What are the signs or symptoms?  Trouble staying asleep.  Being sleepy or tired during the day.  Getting angry a lot.  Loud snoring.  Headaches in the morning.  Not being able to focus your mind (concentrate).  Forgetting things.  Less interest in sex.  Mood swings.  Personality changes.  Feelings of sadness (depression).  Waking up a lot during the night to pee (urinate).  Dry mouth.  Sore throat. How is this diagnosed?  Your medical history.  A physical exam.  A test that is done when  you are sleeping (sleep study). The test is most often done in a sleep lab but may also be done at home. How is this treated?  Sleeping on your side.  Using a medicine to get rid of mucus in your nose (decongestant).  Avoiding the use of alcohol, medicines to help you relax, or certain pain medicines (narcotics).  Losing weight, if needed.  Changing your diet.  Not smoking.  Using a machine to open your airway while you sleep, such as: ? An oral appliance. This is a mouthpiece that shifts your lower jaw forward. ? A CPAP device. This device blows air through a mask when you breathe out (exhale). ? An EPAP device. This has valves that you put in each nostril. ? A BPAP device. This device blows air through a mask when you breathe in (inhale) and breathe out.  Having surgery if other treatments do not work. It is important to get treatment for sleep apnea. Without treatment, it can lead to:  High blood pressure.  Coronary artery disease.  In men, not being able to have an erection (impotence).  Reduced thinking ability.   Follow these instructions at home: Lifestyle  Make changes that your doctor recommends.  Eat a healthy diet.  Lose weight if needed.  Avoid alcohol, medicines to help you relax, and some pain medicines.  Do not use any products that contain nicotine or tobacco, such as cigarettes, e-cigarettes, and chewing tobacco. If you need help quitting, ask your doctor. General instructions  Take over-the-counter and prescription  medicines only as told by your doctor.  If you were given a machine to use while you sleep, use it only as told by your doctor.  If you are having surgery, make sure to tell your doctor you have sleep apnea. You may need to bring your device with you.  Keep all follow-up visits as told by your doctor. This is important. Contact a doctor if:  The machine that you were given to use during sleep bothers you or does not seem to be  working.  You do not get better.  You get worse. Get help right away if:  Your chest hurts.  You have trouble breathing in enough air.  You have an uncomfortable feeling in your back, arms, or stomach.  You have trouble talking.  One side of your body feels weak.  A part of your face is hanging down. These symptoms may be an emergency. Do not wait to see if the symptoms will go away. Get medical help right away. Call your local emergency services (911 in the [place] Do not drive yourself to the hospital. Summary  This condition affects breathing during sleep.  The most common cause is a collapsed or blocked airway.  The goal of treatment is to help you breathe normally while you sleep. This information is not intended to replace advice given to you by your health care provider. Make sure you discuss any questions you have with your health care provider. Document Revised: 01/12/2018 Document Reviewed: 11/21/2017 Elsevier Patient Education  [place].

## 2020-05-20 ENCOUNTER — Encounter: Payer: Self-pay | Admitting: Family Medicine

## 2020-05-20 ENCOUNTER — Ambulatory Visit (INDEPENDENT_AMBULATORY_CARE_PROVIDER_SITE_OTHER): Payer: BC Managed Care – PPO | Admitting: Family Medicine

## 2020-05-20 VITALS — BP 118/78 | HR 63 | Temp 97.5°F | Ht 72.0 in | Wt 251.9 lb

## 2020-05-20 DIAGNOSIS — Z23 Encounter for immunization: Secondary | ICD-10-CM

## 2020-05-20 DIAGNOSIS — E7849 Other hyperlipidemia: Secondary | ICD-10-CM | POA: Diagnosis not present

## 2020-05-20 DIAGNOSIS — I10 Essential (primary) hypertension: Secondary | ICD-10-CM | POA: Diagnosis not present

## 2020-05-20 DIAGNOSIS — E1165 Type 2 diabetes mellitus with hyperglycemia: Secondary | ICD-10-CM | POA: Diagnosis not present

## 2020-05-20 DIAGNOSIS — Z0000 Encounter for general adult medical examination without abnormal findings: Secondary | ICD-10-CM

## 2020-05-20 NOTE — Progress Notes (Signed)
Michael Jacobs DOB: [birth year] Encounter date: 05/20/2020  This is a 50 y.o. 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 5 tears in one eye and 2 in another - had laser surgery to repair these.   Just had mole removed on back.   Yesterday went to [facility] and has sleep study planned.   Hypertension: Amlodipine 7.5 mg daily, losartan 100 mg daily.  has been checking here and there at home. 120's/80-90. Yesterday 120/84. Lowest diastolic 82; lowest systolic 725. He is back on keto diet but doing it healthy way now - not doing any greasy/fried etc. More salads, veggies. Goal is to have everything rechecked in about 3 months - thinks everything will be better at that point.  Anxiety/ADHD: still follows with psychiatry for this.Vyvanse 30 mg daily, Klonopin 0.5 mg at bedtime  Hyperlipidemia: crestor 10mg  daily and fenofibrate.  GERD:protonix 40mg control symptoms. controlled with medication. Repeats dose if needed. trying to drink at least 32 oz daily water.   Diabetes type 2: A1c up to 7; plans to work to get this back down. Metformin 1000mg  BID, jardiance 10mg  daily.    Past Medical History:  Diagnosis Date  . Anxiety   . Asthma   . Cecal ulcer   . Diabetes mellitus ([place]) 2017  . GERD (gastroesophageal reflux disease)   . Hiatal hernia   . Hypercholesterolemia   . Hypertension    Past Surgical History:  Procedure Laterality Date  . COLONOSCOPY  2015  . EYE SURGERY     04/2020- Retina repair bilateral eyes by Dr. Zigmund Daniel per patient   . lasik Bilateral   . POLYPECTOMY    . TONSILLECTOMY AND ADENOIDECTOMY    . UPPER GASTROINTESTINAL ENDOSCOPY    . WISDOM TOOTH EXTRACTION     Allergies  Allergen Reactions  . Ace Inhibitors Cough  . Phenergan [Promethazine Hcl] Anxiety   Current Meds  Medication Sig  . albuterol (VENTOLIN HFA)  108 (90 Base) MCG/ACT inhaler Inhale 2 puffs into the lungs every 6 (six) hours as needed.  . ALPRAZolam (XANAX) 0.25 MG tablet Take 1 tablet (0.25 mg total) by mouth daily as needed for sleep or anxiety.  Marland Kitchen amLODipine (NORVASC) 5 MG tablet TAKE 1 AND 1/2 TABLET BY MOUTH DAILY, LAST REFILL **MUST CALL MD FOR APPOINTMENT  . clonazePAM (KLONOPIN) 0.5 MG tablet Take 1 mg by mouth at bedtime.  . fenofibrate 54 MG tablet Take 1 tablet (54 mg total) by mouth daily.  Marland Kitchen JARDIANCE 10 MG TABS tablet TAKE ONE TABLET BY MOUTH DAILY BEFORE BREAKFAST  . losartan (COZAAR) 100 MG tablet TAKE ONE TABLET BY MOUTH DAILY  . metFORMIN (GLUCOPHAGE) 1000 MG tablet TAKE ONE TABLET BY MOUTH TWICE A DAY WITH MEALS  . Multiple Vitamins-Minerals (MENS MULTIVITAMIN PO) Take 1 tablet by mouth daily.  . pantoprazole (PROTONIX) 40 MG tablet Take 1 tablet (40 mg total) by mouth 2 (two) times daily.  . rosuvastatin (CRESTOR) 10 MG tablet Take 1 tablet (10 mg total) by mouth daily.  . sildenafil (REVATIO) 20 MG tablet TAKE 3-5 TABLETS AS NEEDED PRIOR TO SEXUAL INTERCOURSE, DO NOT TAKE MORE THAN 1 DOSE IN 24 HOURS, **MUST CALL MD FOR APPOINTMENT WITH NEW PCP  . VYVANSE 30 MG capsule Take 30 mg by mouth daily.   Social History   Tobacco Use  . Smoking status: Former  Smoker    Packs/day: 0.25    Years: 3.00    Pack years: 0.75    Types: Cigarettes, Cigars  . Smokeless tobacco: Never Used  Substance Use Topics  . Alcohol use: Yes    Alcohol/week: 3.0 standard drinks    Types: 3 Glasses of wine per week   Family History  Problem Relation Age of Onset  . Colon polyps Mother   . High blood pressure Mother   . High Cholesterol Mother        triglycerides  . Aneurysm Father        brain  . Colon cancer Maternal Grandmother   . Diabetes Maternal Grandmother   . Heart disease Maternal Grandmother   . Colon polyps Maternal Grandmother   . Arthritis Maternal Grandmother   . Breast cancer Paternal Grandmother   . Stomach  cancer Paternal Grandmother        mets to stomach  . Lung cancer Paternal Grandmother   . Prostate cancer Maternal Grandfather   . High blood pressure Maternal Grandfather   . Obesity Sister   . Obesity Brother   . Esophageal cancer Neg Hx   . Rectal cancer Neg Hx      Review of Systems  Constitutional: Negative for activity change, appetite change, chills, fatigue, fever and unexpected weight change.  HENT: Negative for congestion, ear pain, hearing loss, sinus pressure, sinus pain, sore throat and trouble swallowing.   Eyes: Negative for pain and visual disturbance.  Respiratory: Negative for cough, chest tightness, shortness of breath and wheezing.   Cardiovascular: Negative for chest pain, palpitations and leg swelling.  Gastrointestinal: Negative for abdominal distention, abdominal pain, blood in stool, constipation, diarrhea, nausea and vomiting.  Genitourinary: Negative for decreased urine volume, difficulty urinating, dysuria, penile pain and testicular pain.  Musculoskeletal: Negative for arthralgias, back pain and joint swelling.  Skin: Negative for rash.  Neurological: Negative for dizziness, weakness, numbness and headaches.  Hematological: Negative for adenopathy. Does not bruise/bleed easily.  Psychiatric/Behavioral: Negative for agitation, sleep disturbance and suicidal ideas. The patient is not nervous/anxious.     CBC:  Lab Results  Component Value Date   WBC 9.0 05/13/2020   HGB 16.0 05/13/2020   HCT 47.4 05/13/2020   MCH 28.2 11/05/2019   MCHC 33.6 05/13/2020   RDW 14.4 05/13/2020   PLT 303.0 05/13/2020   MPV 11.3 11/05/2019   CMP: Lab Results  Component Value Date   NA 140 05/13/2020   K 4.3 05/13/2020   CL 103 05/13/2020   CO2 30 05/13/2020   GLUCOSE 118 (H) 05/13/2020   BUN 19 05/13/2020   CREATININE 0.99 05/13/2020   CREATININE 1.05 11/05/2019   GFRAA >90 06/19/2011   CALCIUM 10.5 05/13/2020   PROT 6.8 05/13/2020   BILITOT 0.8 05/13/2020    ALKPHOS 63 05/13/2020   ALT 33 05/13/2020   AST 17 05/13/2020   LIPID: Lab Results  Component Value Date   CHOL 119 05/13/2020   TRIG 192.0 (H) 05/13/2020   HDL 35.50 (L) 05/13/2020   LDLCALC 45 05/13/2020   LDLCALC 105 (H) 11/05/2019    Objective:  BP 118/90 (BP Location: Left Arm, Patient Position: Sitting, Cuff Size: Normal)   Pulse 63   Temp (!) 97.5 F (36.4 C) (Oral)   Ht 6' (1.829 m)   Wt 251 lb 14.4 oz (114.3 kg)   SpO2 96%   BMI 34.16 kg/m   Weight: 251 lb 14.4 oz (114.3 kg)   BP Readings from  Last 3 Encounters:  05/20/20 118/90  05/19/20 124/84  12/28/19 124/86   Wt Readings from Last 3 Encounters:  05/20/20 251 lb 14.4 oz (114.3 kg)  05/19/20 257 lb (116.6 kg)  11/13/19 264 lb 9.6 oz (120 kg)    Physical Exam Constitutional:      General: He is not in acute distress.    Appearance: He is well-developed and well-nourished.  HENT:     Head: Normocephalic and atraumatic.     Right Ear: External ear normal.     Left Ear: External ear normal.     Nose: Nose normal.     Mouth/Throat:     Mouth: Oropharynx is clear and moist.     Pharynx: No oropharyngeal exudate.  Eyes:     Conjunctiva/sclera: Conjunctivae normal.     Pupils: Pupils are equal, round, and reactive to light.  Neck:     Thyroid: No thyromegaly.  Cardiovascular:     Rate and Rhythm: Normal rate and regular rhythm.     Pulses: Intact distal pulses.     Heart sounds: Normal heart sounds. No murmur heard. No friction rub. No gallop.   Pulmonary:     Effort: Pulmonary effort is normal. No respiratory distress.     Breath sounds: Normal breath sounds. No stridor. No wheezing or rales.  Abdominal:     General: Bowel sounds are normal.     Palpations: Abdomen is soft.     Hernia: There is no hernia in the left inguinal area or right inguinal area.  Genitourinary:    Penis: Normal.      Testes: Normal.  Musculoskeletal:        General: Normal range of motion.     Cervical back: Neck  supple.  Skin:    General: Skin is warm and dry.  Neurological:     Mental Status: He is alert and oriented to person, place, and time.  Psychiatric:        Mood and Affect: Mood and affect normal.        Behavior: Behavior normal.        Thought Content: Thought content normal.        Judgment: Judgment normal.     Assessment/Plan: Health Maintenance Due  Topic Date Due  . INFLUENZA VACCINE  11/10/2019   Health Maintenance reviewed.  1. Preventative health care He is up to date with preventative health care.   2. Primary hypertension Has been well controlled. We discussed as he continues to lose weight and work on healthier eating and regular exercise, we can hopefully decrease medications.   3. Type 2 diabetes mellitus with hyperglycemia, without long-term current use of insulin (HCC) Currently taking jardiance 10mg  daily, metformin 1000mg  BID. Working on weight loss and exercise; we will recheck A1C in 3 mo at follow up visit; hoping we may drop jardiance at that time.  4. Other hyperlipidemia Continue with Crestor 10 mg daily.  LDL is much better controlled with this.  Continue with fenofibrate 54 mg daily.  I do suspect cholesterol also improved with diet and exercise.  We will plan to recheck in 6 months time.  5. Need for influenza vaccination - Flu Vaccine QUAD 6+ mos PF IM (Fluarix Quad PF)  Return in about 3 months (around 08/17/2020) for Chronic condition visit.  Micheline Rough, MD

## 2020-05-21 ENCOUNTER — Other Ambulatory Visit: Payer: Self-pay | Admitting: Family Medicine

## 2020-06-15 ENCOUNTER — Encounter: Payer: Self-pay | Admitting: Pulmonary Disease

## 2020-06-17 ENCOUNTER — Ambulatory Visit: Payer: BC Managed Care – PPO | Admitting: Pulmonary Disease

## 2020-06-17 ENCOUNTER — Other Ambulatory Visit: Payer: Self-pay

## 2020-06-17 ENCOUNTER — Ambulatory Visit: Payer: BC Managed Care – PPO

## 2020-06-17 DIAGNOSIS — G4733 Obstructive sleep apnea (adult) (pediatric): Secondary | ICD-10-CM | POA: Diagnosis not present

## 2020-06-22 ENCOUNTER — Telehealth: Payer: Self-pay | Admitting: Pulmonary Disease

## 2020-06-22 DIAGNOSIS — G4733 Obstructive sleep apnea (adult) (pediatric): Secondary | ICD-10-CM

## 2020-06-22 NOTE — Telephone Encounter (Signed)
  Call patient  Sleep study result  Date of study: 06/17/2020  Impression: Mild obstructive sleep apnea Mild oxygen desaturations  Recommendation: Options for treatment of mild obstructive sleep apnea will include 1.  CPAP therapy may be considered if he has significant daytime symptoms -Auto titrating CPAP with pressure settings of 5-15 will be appropriate  2.  An oral device may be considered as an option of treatment for mild sleep disordered breathing-we will require referral to a dentist to fashion an oral device  3.  Watchful waiting with focus on weight loss and sleep position modification-if not very symptomatic  Encourage aggressive weight loss measures  Follow-up as previously scheduled

## 2020-06-22 NOTE — Telephone Encounter (Signed)
ATC patient unable to reach LM to call back office (x1)  

## 2020-06-22 NOTE — Telephone Encounter (Signed)
Spoke with the pt and notified of results  He verbalized understanding  Prefers CPAP  Order sent to [facility]  Notified pt about the potential delay in receiving CPAP machine due to back order  Pt aware to call here for his 31-90 day rov   Pt wants to be notified of what DME his order is sent to and wants to speak with [facility] once they have faxed the orders  Will forward to [facility] basket, thanks

## 2020-06-25 NOTE — Telephone Encounter (Signed)
Spoke to the patient gave him the info he needed

## 2020-07-18 ENCOUNTER — Other Ambulatory Visit: Payer: Self-pay | Admitting: Family Medicine

## 2020-07-20 DIAGNOSIS — R0681 Apnea, not elsewhere classified: Secondary | ICD-10-CM | POA: Diagnosis not present

## 2020-07-20 DIAGNOSIS — G4733 Obstructive sleep apnea (adult) (pediatric): Secondary | ICD-10-CM | POA: Diagnosis not present

## 2020-07-20 DIAGNOSIS — R0683 Snoring: Secondary | ICD-10-CM | POA: Diagnosis not present

## 2020-07-20 DIAGNOSIS — I10 Essential (primary) hypertension: Secondary | ICD-10-CM | POA: Diagnosis not present

## 2020-07-23 ENCOUNTER — Other Ambulatory Visit: Payer: Self-pay | Admitting: Family Medicine

## 2020-08-07 ENCOUNTER — Other Ambulatory Visit: Payer: Self-pay | Admitting: Family Medicine

## 2020-08-07 MED ORDER — METFORMIN HCL 1000 MG PO TABS: ORAL_TABLET | ORAL | 0 refills | Status: DC

## 2020-08-07 NOTE — Telephone Encounter (Signed)
I think he usually gets this from psychiatry? Please check with him.

## 2020-08-10 NOTE — Telephone Encounter (Signed)
Left a detailed message at the pts cell number to contact his psychiatrist for refills.

## 2020-08-12 ENCOUNTER — Other Ambulatory Visit: Payer: Self-pay | Admitting: Family Medicine

## 2020-08-12 MED ORDER — LOSARTAN POTASSIUM 100 MG PO TABS
1.0000 | ORAL_TABLET | Freq: Every day | ORAL | 1 refills | Status: DC
Start: 2020-08-12 — End: 2021-05-30

## 2020-08-12 NOTE — Telephone Encounter (Signed)
Losartan sent.

## 2020-08-14 ENCOUNTER — Other Ambulatory Visit: Payer: Self-pay

## 2020-08-17 ENCOUNTER — Encounter: Payer: Self-pay | Admitting: Family Medicine

## 2020-08-17 ENCOUNTER — Ambulatory Visit (INDEPENDENT_AMBULATORY_CARE_PROVIDER_SITE_OTHER): Payer: BC Managed Care – PPO | Admitting: Family Medicine

## 2020-08-17 ENCOUNTER — Other Ambulatory Visit: Payer: Self-pay

## 2020-08-17 VITALS — BP 120/90 | HR 70 | Temp 97.7°F | Ht 72.0 in | Wt 252.5 lb

## 2020-08-17 DIAGNOSIS — E7849 Other hyperlipidemia: Secondary | ICD-10-CM | POA: Diagnosis not present

## 2020-08-17 DIAGNOSIS — E1165 Type 2 diabetes mellitus with hyperglycemia: Secondary | ICD-10-CM

## 2020-08-17 DIAGNOSIS — I10 Essential (primary) hypertension: Secondary | ICD-10-CM | POA: Diagnosis not present

## 2020-08-17 DIAGNOSIS — K5792 Diverticulitis of intestine, part unspecified, without perforation or abscess without bleeding: Secondary | ICD-10-CM

## 2020-08-17 DIAGNOSIS — K219 Gastro-esophageal reflux disease without esophagitis: Secondary | ICD-10-CM | POA: Diagnosis not present

## 2020-08-17 LAB — POCT GLYCOSYLATED HEMOGLOBIN (HGB A1C): Hemoglobin A1C: 5.9 % — AB (ref 4.0–5.6)

## 2020-08-17 MED ORDER — METRONIDAZOLE 500 MG PO TABS: 500.0000 mg | ORAL_TABLET | Freq: Three times a day (TID) | ORAL | 0 refills | Status: DC

## 2020-08-17 MED ORDER — CIPROFLOXACIN HCL 500 MG PO TABS: 500.0000 mg | ORAL_TABLET | Freq: Two times a day (BID) | ORAL | 0 refills | Status: AC

## 2020-08-17 NOTE — Patient Instructions (Addendum)
*  you could try to decrease the protonix to 20mg  daily (half tab) and see if this still works for you.   *keep up great work with exercise and healthier eating.   *start taking the amlodipine 1.5 tablets daily (ok to split dose if desired). Update me in 2-3 weeks with numbers.   *OK to drop the metformin to once daily dosing.

## 2020-08-17 NOTE — Progress Notes (Signed)
Michael Jacobs DOB: 1970/12/15 Encounter date: 08/17/2020  This is a 50 y.o. male who presents with Chief Complaint  Patient presents with  . Follow-up    History of present illness: Sleep study: mild apnea - does have cpap; having harder time with fit on this.   Has been following with ophtho - had a lot of work on retina.   Hypertension: taking amlodipine 5mg , losartan 100mg  daily. Still finding diastolic at 90.   Anxiety/ADHD: Follows with psychiatry.  Vyvanse 30 mg daily, Klonopin 0.5 mg at bedtime  Hyperlipidemia: Crestor 10 mg daily, fenofibrate  GERD: Protonix 40 mg; symptoms require second dose during the day to control symptoms. Not been able to go off of this medication. Symptoms flare as soon as he stops medication.   Type 2 diabetes: Metformin 1000 mg twice daily, Jardiance 10 mg daily.  At last visit, he was working on healthier lifestyle to help improve diabetic control. A1C down to 5.9 today from 7 last time. Gained some in last couple of weeks, but generally has been eating better and doing regular work outs.   Diverticulitis flared last week after eating popcorn - had 2 days worth of antibiotics - today is last day. States that it is helping; had first bowel movement this morning.   Allergies  Allergen Reactions  . Ace Inhibitors Cough  . Phenergan [Promethazine Hcl] Anxiety   Current Meds  Medication Sig  . albuterol (VENTOLIN HFA) 108 (90 Base) MCG/ACT inhaler Inhale 2 puffs into the lungs every 6 (six) hours as needed.  . ALPRAZolam (XANAX) 0.25 MG tablet Take 1 tablet (0.25 mg total) by mouth daily as needed for sleep or anxiety.  Marland Kitchen amLODipine (NORVASC) 5 MG tablet TAKE 1 AND 1/2 TABLET BY MOUTH DAILY **MUST CALL MD FOR APPOINTMENT (LAST REFILL!)  . clonazePAM (KLONOPIN) 0.5 MG tablet Take 1 mg by mouth at bedtime.  . fenofibrate 54 MG tablet TAKE ONE TABLET BY MOUTH DAILY  . JARDIANCE 10 MG TABS tablet TAKE ONE TABLET BY MOUTH DAILY BEFORE BREAKFAST  .  losartan (COZAAR) 100 MG tablet Take 1 tablet (100 mg total) by mouth daily.  . metFORMIN (GLUCOPHAGE) 1000 MG tablet TAKE ONE TABLET BY MOUTH TWICE A DAY WITH MEALS  . Multiple Vitamins-Minerals (MENS MULTIVITAMIN PO) Take 1 tablet by mouth daily.  . pantoprazole (PROTONIX) 40 MG tablet Take 1 tablet (40 mg total) by mouth 2 (two) times daily.  . rosuvastatin (CRESTOR) 10 MG tablet Take 1 tablet (10 mg total) by mouth daily.  . sildenafil (REVATIO) 20 MG tablet TAKE 3 TO 5 TABLETS BY MOUTH AS NEEDED PRIOR TO SEXUAL INTERCOURSE; DO NOT TAKE MORE THAN 1 DOSE IN 24 HOURS !  . VYVANSE 30 MG capsule Take 30 mg by mouth daily.    Review of Systems  Constitutional: Negative for chills, fatigue and fever.  Respiratory: Negative for cough, chest tightness, shortness of breath and wheezing.   Cardiovascular: Negative for chest pain, palpitations and leg swelling.    Objective:  BP 120/90 (BP Location: Left Arm, Patient Position: Sitting, Cuff Size: Large)   Pulse 70   Temp 97.7 F (36.5 C) (Oral)   Ht 6' (1.829 m)   Wt 252 lb 8 oz (114.5 kg)   SpO2 97%   BMI 34.25 kg/m   Weight: 252 lb 8 oz (114.5 kg)   BP Readings from Last 3 Encounters:  08/17/20 120/90  05/20/20 118/78  05/19/20 124/84   Wt Readings from Last 3  Encounters:  08/17/20 252 lb 8 oz (114.5 kg)  05/20/20 251 lb 14.4 oz (114.3 kg)  05/19/20 257 lb (116.6 kg)    Physical Exam Constitutional:      General: He is not in acute distress.    Appearance: He is well-developed.  Cardiovascular:     Rate and Rhythm: Normal rate and regular rhythm.     Heart sounds: Normal heart sounds. No murmur heard. No friction rub.  Pulmonary:     Effort: Pulmonary effort is normal. No respiratory distress.     Breath sounds: Normal breath sounds. No wheezing or rales.  Musculoskeletal:     Right lower leg: No edema.     Left lower leg: No edema.  Neurological:     Mental Status: He is alert and oriented to person, place, and time.   Psychiatric:        Behavior: Behavior normal.     Assessment/Plan  1. Primary hypertension Start amlodipine 7.5mg  daily - we discussed importance of better bp control. Update me through mychart in 2-3 weeks with numbers.  2. Gastroesophageal reflux disease, unspecified whether esophagitis present Can try to decrease protonix to 20mg , but if acid reflux sx then return to 40mg .  3. Type 2 diabetes mellitus with hyperglycemia, without long-term current use of insulin (HCC) Significant improvement in A1C! Ok to decrease metformin to once daily dosing. - POC HgB A1c  4. Other hyperlipidemia Continue crestor 10mg  daily. Well controlled with this.  5. Diverticulitis Will give full supply but he only needs a few additional days; this will give him a few extra in case he has another flare over weekend when he cannot see provider. Currently sx are significantly improved. - ciprofloxacin (CIPRO) 500 MG tablet; Take 1 tablet (500 mg total) by mouth 2 (two) times daily for 7 days.  Dispense: 14 tablet; Refill: 0 - metroNIDAZOLE (FLAGYL) 500 MG tablet; Take 1 tablet (500 mg total) by mouth 3 (three) times daily.  Dispense: 21 tablet; Refill: 0    Return in about 3 months (around 11/17/2020) for Chronic condition visit.    Micheline Rough, MD

## 2020-08-19 DIAGNOSIS — R0683 Snoring: Secondary | ICD-10-CM | POA: Diagnosis not present

## 2020-08-19 DIAGNOSIS — I10 Essential (primary) hypertension: Secondary | ICD-10-CM | POA: Diagnosis not present

## 2020-08-19 DIAGNOSIS — R0681 Apnea, not elsewhere classified: Secondary | ICD-10-CM | POA: Diagnosis not present

## 2020-08-19 DIAGNOSIS — G4733 Obstructive sleep apnea (adult) (pediatric): Secondary | ICD-10-CM | POA: Diagnosis not present

## 2020-08-25 DIAGNOSIS — F902 Attention-deficit hyperactivity disorder, combined type: Secondary | ICD-10-CM | POA: Diagnosis not present

## 2020-08-25 DIAGNOSIS — Z79899 Other long term (current) drug therapy: Secondary | ICD-10-CM | POA: Diagnosis not present

## 2020-08-26 DIAGNOSIS — F902 Attention-deficit hyperactivity disorder, combined type: Secondary | ICD-10-CM | POA: Diagnosis not present

## 2020-09-10 ENCOUNTER — Encounter (INDEPENDENT_AMBULATORY_CARE_PROVIDER_SITE_OTHER): Payer: BC Managed Care – PPO | Admitting: Ophthalmology

## 2020-09-19 DIAGNOSIS — G4733 Obstructive sleep apnea (adult) (pediatric): Secondary | ICD-10-CM | POA: Diagnosis not present

## 2020-09-19 DIAGNOSIS — R0683 Snoring: Secondary | ICD-10-CM | POA: Diagnosis not present

## 2020-09-19 DIAGNOSIS — R0681 Apnea, not elsewhere classified: Secondary | ICD-10-CM | POA: Diagnosis not present

## 2020-09-19 DIAGNOSIS — I10 Essential (primary) hypertension: Secondary | ICD-10-CM | POA: Diagnosis not present

## 2020-10-08 ENCOUNTER — Encounter (INDEPENDENT_AMBULATORY_CARE_PROVIDER_SITE_OTHER): Payer: BC Managed Care – PPO | Admitting: Ophthalmology

## 2020-10-14 ENCOUNTER — Other Ambulatory Visit: Payer: Self-pay

## 2020-10-14 ENCOUNTER — Encounter (INDEPENDENT_AMBULATORY_CARE_PROVIDER_SITE_OTHER): Payer: BC Managed Care – PPO | Admitting: Ophthalmology

## 2020-10-14 DIAGNOSIS — H33303 Unspecified retinal break, bilateral: Secondary | ICD-10-CM

## 2020-10-14 DIAGNOSIS — H43813 Vitreous degeneration, bilateral: Secondary | ICD-10-CM | POA: Diagnosis not present

## 2020-10-14 DIAGNOSIS — H35413 Lattice degeneration of retina, bilateral: Secondary | ICD-10-CM

## 2020-10-14 DIAGNOSIS — I10 Essential (primary) hypertension: Secondary | ICD-10-CM

## 2020-10-14 DIAGNOSIS — H35033 Hypertensive retinopathy, bilateral: Secondary | ICD-10-CM

## 2020-10-19 DIAGNOSIS — G4733 Obstructive sleep apnea (adult) (pediatric): Secondary | ICD-10-CM | POA: Diagnosis not present

## 2020-10-19 DIAGNOSIS — M9903 Segmental and somatic dysfunction of lumbar region: Secondary | ICD-10-CM | POA: Diagnosis not present

## 2020-10-19 DIAGNOSIS — M9905 Segmental and somatic dysfunction of pelvic region: Secondary | ICD-10-CM | POA: Diagnosis not present

## 2020-10-19 DIAGNOSIS — R0681 Apnea, not elsewhere classified: Secondary | ICD-10-CM | POA: Diagnosis not present

## 2020-10-19 DIAGNOSIS — R0683 Snoring: Secondary | ICD-10-CM | POA: Diagnosis not present

## 2020-10-19 DIAGNOSIS — I10 Essential (primary) hypertension: Secondary | ICD-10-CM | POA: Diagnosis not present

## 2020-10-19 DIAGNOSIS — M9901 Segmental and somatic dysfunction of cervical region: Secondary | ICD-10-CM | POA: Diagnosis not present

## 2020-10-19 DIAGNOSIS — M9902 Segmental and somatic dysfunction of thoracic region: Secondary | ICD-10-CM | POA: Diagnosis not present

## 2020-10-28 DIAGNOSIS — I10 Essential (primary) hypertension: Secondary | ICD-10-CM | POA: Diagnosis not present

## 2020-10-28 DIAGNOSIS — G4733 Obstructive sleep apnea (adult) (pediatric): Secondary | ICD-10-CM | POA: Diagnosis not present

## 2020-11-10 DIAGNOSIS — G4733 Obstructive sleep apnea (adult) (pediatric): Secondary | ICD-10-CM | POA: Diagnosis not present

## 2020-11-10 DIAGNOSIS — I10 Essential (primary) hypertension: Secondary | ICD-10-CM | POA: Diagnosis not present

## 2020-11-18 ENCOUNTER — Ambulatory Visit: Payer: BC Managed Care – PPO | Admitting: Family Medicine

## 2020-11-22 ENCOUNTER — Other Ambulatory Visit: Payer: Self-pay | Admitting: Family Medicine

## 2020-11-23 ENCOUNTER — Other Ambulatory Visit: Payer: Self-pay | Admitting: Family Medicine

## 2020-11-30 DIAGNOSIS — F419 Anxiety disorder, unspecified: Secondary | ICD-10-CM | POA: Diagnosis not present

## 2020-11-30 DIAGNOSIS — F902 Attention-deficit hyperactivity disorder, combined type: Secondary | ICD-10-CM | POA: Diagnosis not present

## 2020-11-30 DIAGNOSIS — Z79899 Other long term (current) drug therapy: Secondary | ICD-10-CM | POA: Diagnosis not present

## 2020-12-01 ENCOUNTER — Other Ambulatory Visit: Payer: Self-pay | Admitting: *Deleted

## 2020-12-01 MED ORDER — SILDENAFIL CITRATE 20 MG PO TABS: ORAL_TABLET | ORAL | 0 refills | Status: DC

## 2020-12-01 NOTE — Telephone Encounter (Signed)
Rx done. 

## 2021-01-01 ENCOUNTER — Ambulatory Visit: Payer: Commercial Managed Care - PPO | Admitting: Family Medicine

## 2021-01-01 ENCOUNTER — Encounter: Payer: Self-pay | Admitting: Family Medicine

## 2021-01-01 ENCOUNTER — Other Ambulatory Visit: Payer: Self-pay

## 2021-01-01 VITALS — BP 120/90 | HR 67 | Temp 97.6°F | Ht 72.0 in | Wt 257.9 lb

## 2021-01-01 DIAGNOSIS — E7849 Other hyperlipidemia: Secondary | ICD-10-CM

## 2021-01-01 DIAGNOSIS — F4322 Adjustment disorder with anxiety: Secondary | ICD-10-CM

## 2021-01-01 DIAGNOSIS — K219 Gastro-esophageal reflux disease without esophagitis: Secondary | ICD-10-CM

## 2021-01-01 DIAGNOSIS — E1165 Type 2 diabetes mellitus with hyperglycemia: Secondary | ICD-10-CM | POA: Diagnosis not present

## 2021-01-01 DIAGNOSIS — Z23 Encounter for immunization: Secondary | ICD-10-CM | POA: Diagnosis not present

## 2021-01-01 DIAGNOSIS — I10 Essential (primary) hypertension: Secondary | ICD-10-CM

## 2021-01-01 LAB — CBC WITH DIFFERENTIAL/PLATELET
Basophils Absolute: 0 10*3/uL (ref 0.0–0.1)
Basophils Relative: 0.4 % (ref 0.0–3.0)
Eosinophils Absolute: 0.2 10*3/uL (ref 0.0–0.7)
Eosinophils Relative: 2.5 % (ref 0.0–5.0)
HCT: 47.5 % (ref 39.0–52.0)
Hemoglobin: 15.7 g/dL (ref 13.0–17.0)
Lymphocytes Relative: 35.5 % (ref 12.0–46.0)
Lymphs Abs: 2.8 10*3/uL (ref 0.7–4.0)
MCHC: 33 g/dL (ref 30.0–36.0)
MCV: 88.2 fl (ref 78.0–100.0)
Monocytes Absolute: 0.5 10*3/uL (ref 0.1–1.0)
Monocytes Relative: 6.8 % (ref 3.0–12.0)
Neutro Abs: 4.3 10*3/uL (ref 1.4–7.7)
Neutrophils Relative %: 54.8 % (ref 43.0–77.0)
Platelets: 266 10*3/uL (ref 150.0–400.0)
RBC: 5.39 Mil/uL (ref 4.22–5.81)
RDW: 14.9 % (ref 11.5–15.5)
WBC: 7.8 10*3/uL (ref 4.0–10.5)

## 2021-01-01 LAB — COMPREHENSIVE METABOLIC PANEL
ALT: 27 U/L (ref 0–53)
AST: 16 U/L (ref 0–37)
Albumin: 4.4 g/dL (ref 3.5–5.2)
Alkaline Phosphatase: 59 U/L (ref 39–117)
BUN: 20 mg/dL (ref 6–23)
CO2: 26 mEq/L (ref 19–32)
Calcium: 9.8 mg/dL (ref 8.4–10.5)
Chloride: 104 mEq/L (ref 96–112)
Creatinine, Ser: 0.96 mg/dL (ref 0.40–1.50)
GFR: 93.61 mL/min (ref >60.00)
Glucose, Bld: 108 mg/dL — ABNORMAL HIGH (ref 70–99)
Potassium: 4.3 mEq/L (ref 3.5–5.1)
Sodium: 140 mEq/L (ref 135–145)
Total Bilirubin: 0.9 mg/dL (ref 0.2–1.2)
Total Protein: 6.8 g/dL (ref 6.0–8.3)

## 2021-01-01 LAB — LIPID PANEL
Cholesterol: 108 mg/dL (ref 0–200)
HDL: 37 mg/dL — ABNORMAL LOW (ref >39.00)
NonHDL: 70.85
Total CHOL/HDL Ratio: 3
Triglycerides: 254 mg/dL — ABNORMAL HIGH (ref 0.0–149.0)
VLDL: 50.8 mg/dL — ABNORMAL HIGH (ref 0.0–40.0)

## 2021-01-01 LAB — MICROALBUMIN / CREATININE URINE RATIO
Creatinine,U: 104 mg/dL
Microalb Creat Ratio: 0.8 mg/g (ref 0.0–30.0)
Microalb, Ur: 0.8 mg/dL (ref 0.0–1.9)

## 2021-01-01 LAB — LDL CHOLESTEROL, DIRECT: Direct LDL: 42 mg/dL

## 2021-01-01 LAB — HEMOGLOBIN A1C: Hgb A1c MFr Bld: 6.4 % (ref 4.6–6.5)

## 2021-01-01 MED ORDER — UNABLE TO FIND: Status: DC

## 2021-01-01 NOTE — Addendum Note (Signed)
Addended by: Agnes Lawrence on: 01/01/2021 09:18 AM   Modules accepted: Orders

## 2021-01-01 NOTE — Addendum Note (Signed)
Addended by: Caren Macadam on: 01/01/2021 09:06 AM   Modules accepted: Orders

## 2021-01-01 NOTE — Addendum Note (Signed)
Addended by: Amanda Cockayne on: 01/01/2021 09:15 AM   Modules accepted: Orders

## 2021-01-01 NOTE — Progress Notes (Signed)
Michael Jacobs DOB: 1970/10/10 Encounter date: 01/01/2021  This is a 50 y.o. male who presents with Chief Complaint  Patient presents with   Follow-up    History of present illness: Last visit was 08/17/20:   Sleep study: mild apnea - does have cpap; got nose clouds and put on regular setting and has done great in last 2-3 weeks. Not snoring. Feels better overall.    Has been following with ophtho - had a lot of work on retina.    Hypertension: increased amlodipine to 7.5 at last visit; continued losartan 100mg  daily. Hasn't been checking recently at home because was running well. Took medications about 40 minutes ago; has seen as low as 80's at home. Did have salty meals last couple of nights.    Anxiety/ADHD: Follows with psychiatry.  Vyvanse 30 mg daily, Klonopin 0.5 mg at bedtime. Still stable.    Hyperlipidemia: Crestor 10 mg daily, fenofibrate   GERD: suggested trial to decrease to 20mg  at last visit if tolerated. He was able to tolerate this and has continued with this dose.   Type 2 diabetes: Metformin 1000 mg once daily, Jardiance 10 mg daily. Stomach better with metformin just once daily. Checks weekly - around 118-120 usually. No low sugars. He does do some fasting with diet. He is still limiting carbs.    Has not had bowel issues since last episode. He has only ever gotten this after popcorn or poppy seeds.    Current Meds  Medication Sig   albuterol (VENTOLIN HFA) 108 (90 Base) MCG/ACT inhaler Inhale 2 puffs into the lungs every 6 (six) hours as needed.   ALPRAZolam (XANAX) 0.25 MG tablet Take 1 tablet (0.25 mg total) by mouth daily as needed for sleep or anxiety.   amLODipine (NORVASC) 5 MG tablet TAKE 1 AND 1/2 TABLET BY MOUTH DAILY   clonazePAM (KLONOPIN) 0.5 MG tablet Take 1 mg by mouth at bedtime.   fenofibrate 54 MG tablet TAKE ONE TABLET BY MOUTH DAILY   JARDIANCE 10 MG TABS tablet TAKE ONE TABLET BY MOUTH DAILY BEFORE BREAKFAST   losartan (COZAAR) 100 MG  tablet Take 1 tablet (100 mg total) by mouth daily.   metFORMIN (GLUCOPHAGE) 1000 MG tablet TAKE ONE TABLET BY MOUTH TWICE A DAY WITH MEALS   pantoprazole (PROTONIX) 40 MG tablet Take 1 tablet (40 mg total) by mouth 2 (two) times daily.   rosuvastatin (CRESTOR) 10 MG tablet TAKE ONE TABLET BY MOUTH DAILY   sildenafil (REVATIO) 20 MG tablet TAKE 3 TO 5 TABLETS BY MOUTH AS NEEDED PRIOR TO SEXUAL INTERCOURSE; DO NOT TAKE MORE THAN 1 DOSE IN 24 HOURS !   VYVANSE 30 MG capsule Take 30 mg by mouth daily.    Review of Systems  Constitutional:  Negative for chills, fatigue and fever.  Respiratory:  Negative for cough, chest tightness, shortness of breath and wheezing.   Cardiovascular:  Negative for chest pain, palpitations and leg swelling.   Objective:  BP 120/90   Pulse 67   Temp 97.6 F (36.4 C) (Oral)   Ht 6' (1.829 m)   Wt 257 lb 14.4 oz (117 kg)   SpO2 98%   BMI 34.98 kg/m   Weight: 257 lb 14.4 oz (117 kg)   BP Readings from Last 3 Encounters:  01/01/21 120/90  08/17/20 120/90  05/20/20 118/78   Wt Readings from Last 3 Encounters:  01/01/21 257 lb 14.4 oz (117 kg)  08/17/20 252 lb 8 oz (114.5 kg)  05/20/20 251 lb 14.4 oz (114.3 kg)    Physical Exam Constitutional:      General: He is not in acute distress.    Appearance: He is well-developed.  HENT:     Head: Normocephalic and atraumatic.  Cardiovascular:     Rate and Rhythm: Normal rate and regular rhythm.     Heart sounds: Normal heart sounds. No murmur heard. Pulmonary:     Effort: Pulmonary effort is normal.     Breath sounds: Normal breath sounds.  Abdominal:     General: Bowel sounds are normal. There is no distension.     Palpations: Abdomen is soft.     Tenderness: There is no abdominal tenderness. There is no guarding.  Skin:    General: Skin is warm and dry.     Comments: Sensory exam of the foot is normal, tested with the monofilament. Good pulses, no lesions or ulcers, good peripheral pulses.   Psychiatric:        Judgment: Judgment normal.    Assessment/Plan  1. Primary hypertension Elevated today, but weight up and increased salt last night. He does better in fall and winter typically. We will continue with current meds and he will monitor and let me know how bp looks at home. If elevated we will adjust medications.  - CBC with Differential/Platelet; Future - Comprehensive metabolic panel; Future  2. Type 2 diabetes mellitus with hyperglycemia, without long-term current use of insulin (HCC) Has been well controlled; getting back on track with eating.  - Hemoglobin A1c; Future - HM DIABETES FOOT EXAM - Microalbumin / creatinine urine ratio; Future  3. Gastroesophageal reflux disease, unspecified whether esophagitis present Stable on protonix 20mg .   4. Other hyperlipidemia Continue with current medications.  - Lipid panel; Future  5. Adjustment disorder with anxiety Stable; follows with psychiatry.    Return in about 3 months (around 04/02/2021) for Chronic condition visit.     Micheline Rough, MD

## 2021-01-17 ENCOUNTER — Other Ambulatory Visit: Payer: Self-pay | Admitting: Family Medicine

## 2021-03-23 ENCOUNTER — Other Ambulatory Visit: Payer: Self-pay | Admitting: Family Medicine

## 2021-04-02 ENCOUNTER — Ambulatory Visit (INDEPENDENT_AMBULATORY_CARE_PROVIDER_SITE_OTHER): Payer: Commercial Managed Care - PPO | Admitting: Family Medicine

## 2021-04-02 ENCOUNTER — Encounter: Payer: Self-pay | Admitting: Family Medicine

## 2021-04-02 VITALS — BP 102/80 | HR 84 | Temp 98.1°F | Ht 72.0 in | Wt 248.5 lb

## 2021-04-02 DIAGNOSIS — E7849 Other hyperlipidemia: Secondary | ICD-10-CM

## 2021-04-02 DIAGNOSIS — Z8379 Family history of other diseases of the digestive system: Secondary | ICD-10-CM | POA: Diagnosis not present

## 2021-04-02 DIAGNOSIS — I10 Essential (primary) hypertension: Secondary | ICD-10-CM | POA: Diagnosis not present

## 2021-04-02 DIAGNOSIS — K589 Irritable bowel syndrome without diarrhea: Secondary | ICD-10-CM

## 2021-04-02 DIAGNOSIS — E1165 Type 2 diabetes mellitus with hyperglycemia: Secondary | ICD-10-CM | POA: Diagnosis not present

## 2021-04-02 LAB — COMPREHENSIVE METABOLIC PANEL
ALT: 31 U/L (ref 0–53)
AST: 18 U/L (ref 0–37)
Albumin: 4.7 g/dL (ref 3.5–5.2)
Alkaline Phosphatase: 73 U/L (ref 39–117)
BUN: 20 mg/dL (ref 6–23)
CO2: 24 mEq/L (ref 19–32)
Calcium: 9.9 mg/dL (ref 8.4–10.5)
Chloride: 103 mEq/L (ref 96–112)
Creatinine, Ser: 1.12 mg/dL (ref 0.40–1.50)
GFR: 77.66 mL/min (ref >60.00)
Glucose, Bld: 94 mg/dL (ref 70–99)
Potassium: 3.9 mEq/L (ref 3.5–5.1)
Sodium: 138 mEq/L (ref 135–145)
Total Bilirubin: 0.9 mg/dL (ref 0.2–1.2)
Total Protein: 7.8 g/dL (ref 6.0–8.3)

## 2021-04-02 LAB — HEMOGLOBIN A1C: Hgb A1c MFr Bld: 5.9 % (ref 4.6–6.5)

## 2021-04-02 MED ORDER — AMLODIPINE BESYLATE 5 MG PO TABS: 5.0000 mg | ORAL_TABLET | Freq: Every day | ORAL | 1 refills | Status: DC

## 2021-04-02 NOTE — Progress Notes (Signed)
Michael Jacobs DOB: 1971/01/28 Encounter date: 04/02/2021  This is a 50 y.o. male who presents with Chief Complaint  Patient presents with   Follow-up    Patient requests celiac and C-peptide testing due to daughter testing positive for celiac    History of present illness: Last visit with me was 01/01/2021.  Blood pressure was elevated at last visit.  Hypertension: Amlodipine 5 mg daily, losartan 100 mg daily. Stopped taking the extra 2.5mg  of amlodipine - just didn't feel well with this. Seems to feel better with current dosing.  Hyperlipidemia: Fenofibrate 54 mg daily, Crestor 10 mg daily Diabetes: Has been diet controlled.  Last A1c was 6.4 (3 months ago) doing single metformin and single jardiance (10mg ) daily.   Daughter dx with celiac and he would like testing. He has had stomach issues through the years. Has tended more towards lose stools. Has frequently "bad" feeling stomach with things that he eats. Even some meds (like ADHD meds) don't sit well.   Has really been restricting what he is eating. Has cut back on carbs and has added some fat. Doing keto diet now. Has really been successful with this in the past. Will add back in vegetables but in smaller amounts.   Stopped crestor about 2 mo ago. Didn't feel like it was helpful for him- he has been reading about this. Has continued with fenofibrate 54 regularly.   Allergies  Allergen Reactions   Ace Inhibitors Cough   Phenergan [Promethazine Hcl] Anxiety   Current Meds  Medication Sig   albuterol (VENTOLIN HFA) 108 (90 Base) MCG/ACT inhaler Inhale 2 puffs into the lungs every 6 (six) hours as needed.   ALPRAZolam (XANAX) 0.25 MG tablet Take 1 tablet (0.25 mg total) by mouth daily as needed for sleep or anxiety.   amLODipine (NORVASC) 5 MG tablet TAKE 1 AND 1/2 TABLET BY MOUTH DAILY   clonazePAM (KLONOPIN) 0.5 MG tablet Take 1 mg by mouth at bedtime.   fenofibrate 54 MG tablet TAKE ONE TABLET BY MOUTH DAILY   JARDIANCE  10 MG TABS tablet TAKE ONE TABLET BY MOUTH DAILY BEFORE BREAKFAST   losartan (COZAAR) 100 MG tablet Take 1 tablet (100 mg total) by mouth daily.   metFORMIN (GLUCOPHAGE) 1000 MG tablet TAKE ONE TABLET BY MOUTH TWICE A DAY WITH MEALS   pantoprazole (PROTONIX) 40 MG tablet TAKE ONE TABLET BY MOUTH TWICE A DAY   sildenafil (REVATIO) 20 MG tablet TAKE 3 TO 5 TABLETS BY MOUTH AS NEEDED PRIOR TO SEXUAL INTERCOURSE; DO NOT TAKE MORE THAN 1 DOSE IN 24 HOURS !   UNABLE TO FIND Med Name: striction D supplement BID   VYVANSE 30 MG capsule Take 30 mg by mouth daily.   [DISCONTINUED] rosuvastatin (CRESTOR) 10 MG tablet TAKE ONE TABLET BY MOUTH DAILY    Review of Systems  Constitutional:  Negative for chills, fatigue and fever.  Respiratory:  Negative for cough, chest tightness, shortness of breath and wheezing.   Cardiovascular:  Negative for chest pain, palpitations and leg swelling.   Objective:  BP 102/80 (BP Location: Left Arm, Patient Position: Sitting, Cuff Size: Large)    Pulse 84    Temp 98.1 F (36.7 C) (Oral)    Ht 6' (1.829 m)    Wt 248 lb 8 oz (112.7 kg)    SpO2 97%    BMI 33.70 kg/m   Weight: 248 lb 8 oz (112.7 kg)   BP Readings from Last 3 Encounters:  04/02/21 102/80  01/01/21 120/90  08/17/20 120/90   Wt Readings from Last 3 Encounters:  04/02/21 248 lb 8 oz (112.7 kg)  01/01/21 257 lb 14.4 oz (117 kg)  08/17/20 252 lb 8 oz (114.5 kg)    Physical Exam Constitutional:      General: He is not in acute distress.    Appearance: He is well-developed.  Cardiovascular:     Rate and Rhythm: Normal rate and regular rhythm.     Heart sounds: Normal heart sounds. No murmur heard.   No friction rub.  Pulmonary:     Effort: Pulmonary effort is normal. No respiratory distress.     Breath sounds: Normal breath sounds. No wheezing or rales.  Musculoskeletal:     Right lower leg: No edema.     Left lower leg: No edema.  Neurological:     Mental Status: He is alert and oriented to  person, place, and time.  Psychiatric:        Behavior: Behavior normal.    Assessment/Plan  1. Primary hypertension Blood pressure is looking great today.  Continue with current dose of amlodipine 5 mg daily, losartan 100 mg daily.  2. Type 2 diabetes mellitus with hyperglycemia, without long-term current use of insulin (HCC) We will recheck A1c.  He has been losing weight and eating very low carbohydrate diet.  We discussed consideration for decrease in medications pending his lab results.  He wants to keep following a more ketogenic diet, so it may be best for him to be on medication that is less of worry in ketosis.  - C-peptide; Future - Hemoglobin A1c; Future - Hemoglobin A1c - C-peptide  3. Other hyperlipidemia He stopped his statin medication, but we discussed today the benefits of statin in light of diabetes.  We discussed it more for prevention rather than specifically looking at LDL numbers.  He has been reading resources from a doctor who promotes ketosis and states that LDL is not really a value that is as important, but we discussed the stabilization of cholesterol with statin and the increased cardiovascular risk that comes with diabetes. We will check an NMR profile for more information, and I feel we can discuss this to help determine treatment for him going forward.  - Comprehensive metabolic panel; Future - NMR, lipoprofile; Future - NMR, lipoprofile - Comprehensive metabolic panel  4. Family history of celiac disease Daughter recently diagnosed with celiac disease.  He has had intermittent irritable bowel issues and difficulty with "digestion issues" frequently.  He would like to be screened. - Celiac Panel 10; Future - Celiac Panel 10  5. Irritable bowel syndrome, unspecified type - Celiac Panel 10; Future - Celiac Panel 10   Return for pending lab results.    Micheline Rough, MD

## 2021-04-05 ENCOUNTER — Encounter: Payer: Self-pay | Admitting: Family Medicine

## 2021-04-06 LAB — C-PEPTIDE: C-Peptide: 2.94 ng/mL (ref 0.80–3.85)

## 2021-04-07 LAB — NMR, LIPOPROFILE
Cholesterol, Total: 225 mg/dL — ABNORMAL HIGH (ref 100–199)
HDL Particle Number: 28 umol/L — ABNORMAL LOW
HDL-C: 32 mg/dL — ABNORMAL LOW
LDL Particle Number: 1881 nmol/L — ABNORMAL HIGH
LDL Size: 19.8 nm — ABNORMAL LOW
LDL-C (NIH Calc): 147 mg/dL — ABNORMAL HIGH (ref 0–99)
LP-IR Score: 71 — ABNORMAL HIGH
Small LDL Particle Number: 1428 nmol/L — ABNORMAL HIGH
Triglycerides: 248 mg/dL — ABNORMAL HIGH (ref 0–149)

## 2021-04-07 LAB — CELIAC PANEL 10
Deamidated Gliadin Abs, IgA: 4 U (ref 0–19)
Deamidated Gliadin Abs, IgG: 4 U (ref 0–19)
Endomysial IgA: NEGATIVE
Immunoglobulin A, (IgA) QN, Serum: 320 mg/dL (ref 90–386)
t-Transglutaminase (tTG) IgA: <2 U/mL (ref 0–3)
t-Transglutaminase (tTG) IgG: <2 U/mL (ref 0–5)

## 2021-04-09 ENCOUNTER — Encounter: Payer: Self-pay | Admitting: Family Medicine

## 2021-04-09 NOTE — Progress Notes (Signed)
Mychart message sent.

## 2021-04-18 ENCOUNTER — Other Ambulatory Visit: Payer: Self-pay | Admitting: Family Medicine

## 2021-04-22 ENCOUNTER — Other Ambulatory Visit: Payer: Self-pay

## 2021-04-22 ENCOUNTER — Encounter (INDEPENDENT_AMBULATORY_CARE_PROVIDER_SITE_OTHER): Payer: Commercial Managed Care - PPO | Admitting: Ophthalmology

## 2021-04-22 DIAGNOSIS — I10 Essential (primary) hypertension: Secondary | ICD-10-CM | POA: Diagnosis not present

## 2021-04-22 DIAGNOSIS — H35033 Hypertensive retinopathy, bilateral: Secondary | ICD-10-CM

## 2021-04-22 DIAGNOSIS — H43813 Vitreous degeneration, bilateral: Secondary | ICD-10-CM

## 2021-04-22 DIAGNOSIS — H33303 Unspecified retinal break, bilateral: Secondary | ICD-10-CM | POA: Diagnosis not present

## 2021-04-22 LAB — HM DIABETES EYE EXAM

## 2021-04-26 ENCOUNTER — Encounter: Payer: Self-pay | Admitting: Family Medicine

## 2021-05-29 ENCOUNTER — Other Ambulatory Visit: Payer: Self-pay | Admitting: Family Medicine

## 2021-08-14 ENCOUNTER — Other Ambulatory Visit: Payer: Self-pay | Admitting: Family Medicine

## 2021-08-16 MED ORDER — SILDENAFIL CITRATE 20 MG PO TABS: ORAL_TABLET | ORAL | 0 refills | Status: DC

## 2021-08-18 MED ORDER — CLONAZEPAM 0.5 MG PO TABS
1.0000 mg | ORAL_TABLET | Freq: Every day | ORAL | 2 refills | Status: DC
Start: 2021-08-18 — End: 2022-04-29

## 2021-08-18 MED ORDER — VYVANSE 30 MG PO CAPS: 30.0000 mg | ORAL_CAPSULE | Freq: Every day | ORAL | 0 refills | Status: DC

## 2021-08-19 ENCOUNTER — Telehealth: Payer: Self-pay | Admitting: *Deleted

## 2021-08-19 NOTE — Telephone Encounter (Signed)
Michael Jacobs faxed a prior auth request for Vyvanse '30mg'$  ([id number]).  Message sent to PCP for diagnosis in order to submit PA as I did not see this noted in previous office notes. ?

## 2021-08-20 ENCOUNTER — Encounter: Payer: Self-pay | Admitting: Family Medicine

## 2021-08-20 DIAGNOSIS — F988 Other specified behavioral and emotional disorders with onset usually occurring in childhood and adolescence: Secondary | ICD-10-CM | POA: Insufficient documentation

## 2021-08-20 NOTE — Telephone Encounter (Signed)
ADD; he was always getting through psychiatry before. Not sure why refill came to [place] when they were filling for him? But I had this listed as active med for a number of years. If still seeing psychiatry, they should manage. If no longer seeing psychiatry, then he should follow up to discuss (would be due anyway in June for visit).looks like last time he filled vyvanse was 05/2021.  ?

## 2021-12-03 ENCOUNTER — Other Ambulatory Visit: Payer: Self-pay | Admitting: *Deleted

## 2021-12-06 ENCOUNTER — Other Ambulatory Visit: Payer: Self-pay | Admitting: *Deleted

## 2021-12-06 MED ORDER — LOSARTAN POTASSIUM 100 MG PO TABS
100.0000 mg | ORAL_TABLET | Freq: Every day | ORAL | 0 refills | Status: DC
Start: 2021-12-06 — End: 2021-12-06

## 2021-12-06 MED ORDER — LOSARTAN POTASSIUM 100 MG PO TABS: 100.0000 mg | ORAL_TABLET | Freq: Every day | ORAL | 0 refills | Status: DC

## 2021-12-06 NOTE — Telephone Encounter (Signed)
Patient needs appt with me by December-- I will refill today until his appt

## 2022-03-08 ENCOUNTER — Other Ambulatory Visit: Payer: Self-pay | Admitting: *Deleted

## 2022-03-08 MED ORDER — EMPAGLIFLOZIN 10 MG PO TABS: ORAL_TABLET | ORAL | 0 refills | Status: DC

## 2022-03-08 NOTE — Telephone Encounter (Signed)
Ok to fill but he needs an appt to see me in december

## 2022-03-14 ENCOUNTER — Other Ambulatory Visit: Payer: Self-pay | Admitting: *Deleted

## 2022-03-14 MED ORDER — METFORMIN HCL 1000 MG PO TABS: 1000.0000 mg | ORAL_TABLET | Freq: Two times a day (BID) | ORAL | 0 refills | Status: DC

## 2022-03-14 NOTE — Telephone Encounter (Signed)
Patient needs appointment, hasn't been seen in almost 1 year

## 2022-03-21 ENCOUNTER — Telehealth: Payer: Self-pay | Admitting: Family Medicine

## 2022-03-21 MED ORDER — EMPAGLIFLOZIN 10 MG PO TABS: ORAL_TABLET | ORAL | 0 refills | Status: DC

## 2022-03-21 NOTE — Telephone Encounter (Signed)
Rx done. 

## 2022-03-21 NOTE — Telephone Encounter (Signed)
Pt call and made a TOC of care appt with dr.Michael on 03/29/22 and need a 90 refill on empagliflozin (JARDIANCE) 10 MG TABS tablet and not 30 days because is cheaper .

## 2022-03-22 LAB — HM DIABETES EYE EXAM

## 2022-03-23 ENCOUNTER — Telehealth: Payer: Self-pay

## 2022-03-23 ENCOUNTER — Other Ambulatory Visit (HOSPITAL_COMMUNITY): Payer: Self-pay

## 2022-03-23 NOTE — Telephone Encounter (Signed)
Pharmacy Patient Advocate Encounter   Received notification from [facility] [facility] that prior authorization for Jardiance '10MG'$  tablets is required/requested.  Per Test Claim: Max 30 day supply   PA not submitted, called pharmacy to run for 30 day supply, received paid claim.  Archiving in [place]

## 2022-03-29 ENCOUNTER — Ambulatory Visit: Payer: Commercial Managed Care - PPO | Admitting: Family Medicine

## 2022-03-29 ENCOUNTER — Encounter: Payer: Self-pay | Admitting: Family Medicine

## 2022-03-29 VITALS — BP 160/100 | HR 70 | Temp 98.1°F | Ht 72.0 in | Wt 258.3 lb

## 2022-03-29 DIAGNOSIS — E7849 Other hyperlipidemia: Secondary | ICD-10-CM

## 2022-03-29 DIAGNOSIS — R002 Palpitations: Secondary | ICD-10-CM | POA: Diagnosis not present

## 2022-03-29 DIAGNOSIS — K219 Gastro-esophageal reflux disease without esophagitis: Secondary | ICD-10-CM

## 2022-03-29 DIAGNOSIS — E1165 Type 2 diabetes mellitus with hyperglycemia: Secondary | ICD-10-CM

## 2022-03-29 DIAGNOSIS — I10 Essential (primary) hypertension: Secondary | ICD-10-CM

## 2022-03-29 DIAGNOSIS — F5104 Psychophysiologic insomnia: Secondary | ICD-10-CM

## 2022-03-29 DIAGNOSIS — N529 Male erectile dysfunction, unspecified: Secondary | ICD-10-CM

## 2022-03-29 LAB — COMPREHENSIVE METABOLIC PANEL
ALT: 47 U/L (ref 0–53)
AST: 25 U/L (ref 0–37)
Albumin: 4.6 g/dL (ref 3.5–5.2)
Alkaline Phosphatase: 82 U/L (ref 39–117)
BUN: 13 mg/dL (ref 6–23)
CO2: 28 mEq/L (ref 19–32)
Calcium: 10.4 mg/dL (ref 8.4–10.5)
Chloride: 100 mEq/L (ref 96–112)
Creatinine, Ser: 1.1 mg/dL (ref 0.40–1.50)
GFR: 78.81 mL/min (ref >60.00)
Glucose, Bld: 113 mg/dL — ABNORMAL HIGH (ref 70–99)
Potassium: 4.5 mEq/L (ref 3.5–5.1)
Sodium: 138 mEq/L (ref 135–145)
Total Bilirubin: 1.4 mg/dL — ABNORMAL HIGH (ref 0.2–1.2)
Total Protein: 7.5 g/dL (ref 6.0–8.3)

## 2022-03-29 LAB — LIPID PANEL
Cholesterol: 226 mg/dL — ABNORMAL HIGH (ref 0–200)
HDL: 43.5 mg/dL (ref >39.00)
NonHDL: 182.21
Total CHOL/HDL Ratio: 5
Triglycerides: 296 mg/dL — ABNORMAL HIGH (ref 0.0–149.0)
VLDL: 59.2 mg/dL — ABNORMAL HIGH (ref 0.0–40.0)

## 2022-03-29 LAB — CBC
HCT: 51.2 % (ref 39.0–52.0)
Hemoglobin: 17.6 g/dL — ABNORMAL HIGH (ref 13.0–17.0)
MCHC: 34.3 g/dL (ref 30.0–36.0)
MCV: 88.8 fl (ref 78.0–100.0)
Platelets: 267 10*3/uL (ref 150.0–400.0)
RBC: 5.77 Mil/uL (ref 4.22–5.81)
RDW: 14.8 % (ref 11.5–15.5)
WBC: 8.7 10*3/uL (ref 4.0–10.5)

## 2022-03-29 LAB — POCT GLYCOSYLATED HEMOGLOBIN (HGB A1C): Hemoglobin A1C: 6.7 % — AB (ref 4.0–5.6)

## 2022-03-29 LAB — MICROALBUMIN / CREATININE URINE RATIO
Creatinine,U: 75.4 mg/dL
Microalb Creat Ratio: 0.9 mg/g (ref 0.0–30.0)
Microalb, Ur: <0.7 mg/dL (ref 0.0–1.9)

## 2022-03-29 LAB — TSH: TSH: 3.94 u[IU]/mL (ref 0.35–5.50)

## 2022-03-29 LAB — LDL CHOLESTEROL, DIRECT: Direct LDL: 130 mg/dL

## 2022-03-29 MED ORDER — LOSARTAN POTASSIUM 100 MG PO TABS: 100.0000 mg | ORAL_TABLET | Freq: Every day | ORAL | 3 refills | Status: DC

## 2022-03-29 MED ORDER — TRAZODONE HCL 50 MG PO TABS: 25.0000 mg | ORAL_TABLET | Freq: Every evening | ORAL | 0 refills | Status: DC | PRN

## 2022-03-29 MED ORDER — FENOFIBRATE 54 MG PO TABS: 54.0000 mg | ORAL_TABLET | Freq: Every day | ORAL | 3 refills | Status: DC

## 2022-03-29 MED ORDER — SILDENAFIL CITRATE 20 MG PO TABS: ORAL_TABLET | ORAL | 0 refills | Status: DC

## 2022-03-29 MED ORDER — PANTOPRAZOLE SODIUM 40 MG PO TBEC: 40.0000 mg | DELAYED_RELEASE_TABLET | Freq: Two times a day (BID) | ORAL | 3 refills | Status: DC

## 2022-03-29 MED ORDER — AMLODIPINE BESYLATE 5 MG PO TABS: 7.5000 mg | ORAL_TABLET | Freq: Every day | ORAL | 3 refills | Status: DC

## 2022-03-29 MED ORDER — METFORMIN HCL 1000 MG PO TABS: 1000.0000 mg | ORAL_TABLET | Freq: Two times a day (BID) | ORAL | 0 refills | Status: DC

## 2022-03-29 MED ORDER — EMPAGLIFLOZIN 10 MG PO TABS: ORAL_TABLET | ORAL | 3 refills | Status: DC

## 2022-03-29 NOTE — Progress Notes (Signed)
Established Patient Office Visit  Subjective   Patient ID: Michael Jacobs, male    DOB: Apr 03, 1971  Age: 51 y.o. MRN: [id number]  Chief Complaint  Patient presents with   Establish Care   Chest Pain    Patient complains of a sensation of his "heartbeat in the chest and throat" intermittently x4-5 months, initially suspected reflux and worse lately   Medication Refill    Patient requests a 90-day supply on all medications due to cheaper price    Patient is here for both TOC and new problem visit.   Patient is reporting that he could feel his heart beating in his chest and it went up to his throat. Started about 4-5 months ago. Lately he is getting this episode more often. States that when he eats a large meal or moves quickly. He report a family history of abnormal heart beat in his brother and sister. States that he thought that it was from acid reflux but he is already on a PPI. States that he started fasting it got better. No chest pain, no nausea, maybe some mild SOB. No dizziness or lightheadedness.   HTN -- BP is elevated today in office, he reports compliance with his medication at home, states that at home he is usually well controlled. States he did have coffee this morning which could be increasing his BP. He denies chest pain, no headaches.   Diabetes-- A1C performed in office and is higher than previous. Pt reports he stopped eating low carb and this is the reason for his elevated A1C. He reports that he isn't taking the metformin, states that he was nervous about taking it after hearing something about it "being bad" for him. We discussed the risks/benefits of the metformin, pt reports he was not having any side effects to the medication. I advised that metformin is still considered first line for the treatment of DM. Pt reports compliance with his jardiance however.   GERD-- pt reports he is taking the pantoprazole 40 mg daily, reports that he needs refills of his medications  today.   Anxiety/chronic insomnia-- pt states that he has a very stressful job and that he often works late into the night. About 1-2 times per week he has significant difficulty falling asleep. States that he was given the clonazepam for this condition and he has been using it about 1-2 times per week. States that he was seeing the [facility] for his ADHD however he had stomach upset/GI side effects to the stimulant medications and has been off of them for some time. I reviewed his PDMP. We had conversation about other medications that can help with sleep/ anxiety that are much after/ lower risk medications and that are more in alignment with the standard of care.    Current Outpatient Medications  Medication Instructions   albuterol (VENTOLIN HFA) 108 (90 Base) MCG/ACT inhaler 2 puffs, Inhalation, Every 6 hours PRN   amLODipine (NORVASC) 7.5 mg, Oral, Daily   clonazePAM (KLONOPIN) 1 mg, Oral, Daily at bedtime   empagliflozin (JARDIANCE) 10 MG TABS tablet TAKE ONE TABLET BY MOUTH DAILY BEFORE BREAKFAST   fenofibrate 54 mg, Oral, Daily   losartan (COZAAR) 100 mg, Oral, Daily   metFORMIN (GLUCOPHAGE) 1,000 mg, Oral, 2 times daily with meals   pantoprazole (PROTONIX) 40 mg, Oral, 2 times daily   sildenafil (REVATIO) 20 MG tablet TAKE 3 TO 5 TABLETS BY MOUTH AS NEEDED PRIOR TO SEXUAL INTERCOURSE; DO NOT TAKE MORE THAN  1 DOSE IN 24 HOURS !   traZODone (DESYREL) 25-50 mg, Oral, At bedtime PRN   UNABLE TO FIND Med Name: striction D supplement BID    Patient Active Problem List   Diagnosis Date Noted   ADD (attention deficit disorder) 08/20/2021   Pulmonary nodule 07/25/2014   Abnormal finding on CT scan 07/25/2014   Family history of colonic polyps 02/06/2014   Dysphagia, pharyngoesophageal phase 02/06/2014   Diabetes mellitus, type II ([place]) 07/20/2012   Hypertension 03/07/2012   Hyperlipidemia 03/07/2012   GERD (gastroesophageal reflux disease) 03/07/2012   Adjustment  disorder with anxiety 03/07/2012      Review of Systems  All other systems reviewed and are negative.     Objective:     BP (!) 160/100 Comment: repeated by Mykal--jaf  Pulse 70   Temp 98.1 F (36.7 C) (Oral)   Ht 6' (1.829 m)   Wt 258 lb 4.8 oz (117.2 kg)   SpO2 98%   BMI 35.03 kg/m  BP Readings from Last 3 Encounters:  03/29/22 (!) 160/100  04/02/21 102/80  01/01/21 120/90      Physical Exam Vitals reviewed.  Constitutional:      Appearance: Normal appearance. He is well-groomed. He is obese.  Eyes:     Extraocular Movements: Extraocular movements intact.     Conjunctiva/sclera: Conjunctivae normal.  Neck:     Thyroid: No thyromegaly.  Cardiovascular:     Rate and Rhythm: Regular rhythm.     Pulses:          Dorsalis pedis pulses are 2+ on the right side and 2+ on the left side.     Heart sounds: S1 normal and S2 normal. No murmur heard. Pulmonary:     Effort: Pulmonary effort is normal.     Breath sounds: Normal breath sounds and air entry. No rales.  Abdominal:     General: Abdomen is flat. Bowel sounds are normal.  Musculoskeletal:     Right lower leg: No edema.     Left lower leg: No edema.  Feet:     Right foot:     Protective Sensation: 4 sites tested.  4 sites sensed.     Skin integrity: Skin integrity normal.     Toenail Condition: Right toenails are normal.     Left foot:     Protective Sensation: 4 sites tested.  4 sites sensed.     Skin integrity: Skin integrity normal.     Toenail Condition: Left toenails are normal.  Neurological:     General: No focal deficit present.     Mental Status: He is alert and oriented to person, place, and time.     Gait: Gait is intact.  Psychiatric:        Mood and Affect: Mood and affect normal.      The 10-year ASCVD risk score (Arnett DK, et al., 2019) is: 14%    Assessment & Plan:   Problem List Items Addressed This Visit       Unprioritized   Hypertension    BP is uncontrolled today, I  recommended he increase his amlodipine to 10 mg daily (2 tablets) and continue to monitor his BP daily at home. This might be a reason that he is feeling his heartbeat in his chest more frequently....      Relevant Medications   losartan (COZAAR) 100 MG tablet   amLODipine (NORVASC) 5 MG tablet   sildenafil (REVATIO) 20 MG tablet   fenofibrate 54 MG  tablet   Hyperlipidemia   Relevant Medications   losartan (COZAAR) 100 MG tablet   amLODipine (NORVASC) 5 MG tablet   sildenafil (REVATIO) 20 MG tablet   fenofibrate 54 MG tablet   Other Relevant Orders   Lipid Panel (Completed)   GERD (gastroesophageal reflux disease)    Stable sx on once daily pantoprazole 40 mg. Will continue this medication.      Relevant Medications   pantoprazole (PROTONIX) 40 MG tablet   Diabetes mellitus, type II ([place]) - Primary    A1C is still controlled however it is significantly higher than previous labs. Pt is currently off his keto diet, he states he wants to get back on it soon. I encouraged him also to take the metformin at home 1000 mg BID with the 10 mg daily of the jardiance. RTC in 6 months for follow up.      Relevant Medications   losartan (COZAAR) 100 MG tablet   empagliflozin (JARDIANCE) 10 MG TABS tablet   metFORMIN (GLUCOPHAGE) 1000 MG tablet   Other Relevant Orders   POC HgB A1c (Completed)   CMP (Completed)   Microalbumin/Creatinine Ratio, Urine (Completed)   Other Visit Diagnoses     Heart palpitations       Relevant Orders   New symptom, could be related to his elevated BP, however he is not currently having the palpitations in the visit. I recommended referral to cardiology and I have ordered a cardiac event monitor to be worn for 30 days to rule out any arrhythmias. Checking TSH and CBC also today to rule out thyroid and anemia issues.   TSH (Completed)   Cardiac event monitor   Ambulatory referral to Cardiology   CBC (no diff) (Completed)   Erectile dysfunction, unspecified  erectile dysfunction type       Relevant Medications   Pt requesting refill of this medication today.   sildenafil (REVATIO) 20 MG tablet   Chronic insomnia       Relevant Medications   With intermittent anxiety. I advised that the long term use of benzodiazepines are not recommended due to the high risk nature of the medication. I advised that there are many other options in the treatment. I advised that we try trazodone 25-50 mg at bedtime PRN for insomnia. I advised patient that   traZODone (DESYREL) 50 MG tablet       Return in about 6 months (around 09/28/2022) for Follow up DM and HTN.    Farrel Conners, MD

## 2022-03-29 NOTE — Assessment & Plan Note (Signed)
Stable sx on once daily pantoprazole 40 mg. Will continue this medication.

## 2022-03-29 NOTE — Assessment & Plan Note (Signed)
A1C is still controlled however it is significantly higher than previous labs. Pt is currently off his keto diet, he states he wants to get back on it soon. I encouraged him also to take the metformin at home 1000 mg BID with the 10 mg daily of the jardiance. RTC in 6 months for follow up.

## 2022-03-29 NOTE — Assessment & Plan Note (Signed)
BP is uncontrolled today, I recommended he increase his amlodipine to 10 mg daily (2 tablets) and continue to monitor his BP daily at home. This might be a reason that he is feeling his heartbeat in his chest more frequently.Marland KitchenMarland KitchenMarland Kitchen

## 2022-04-08 NOTE — Telephone Encounter (Signed)
Patient contacted.  He will need to contact [employer] directly at [phone] with his new insurance information. If he still has his current insurance 30 days from starting the monitor it would be billed as his secondary insurance. The monitor date will start when he applies the monitor and turns it on.  It will bill out as a 30 day cardiac event monitor or 30 day mobile cardiac telemetry regardless of how many days he wears it.

## 2022-04-13 ENCOUNTER — Encounter: Payer: Self-pay | Admitting: Family Medicine

## 2022-04-19 ENCOUNTER — Telehealth: Payer: Self-pay | Admitting: *Deleted

## 2022-04-19 DIAGNOSIS — E782 Mixed hyperlipidemia: Secondary | ICD-10-CM

## 2022-04-19 MED ORDER — ROSUVASTATIN CALCIUM 20 MG PO TABS: 20.0000 mg | ORAL_TABLET | Freq: Every day | ORAL | 3 refills | Status: DC

## 2022-04-19 NOTE — Telephone Encounter (Signed)
Michael Jacobs faxed a refill request for Rosuvastatin '20mg'$  daily-message sent to PCP as the Rx is not listed on the current medication list.

## 2022-04-29 ENCOUNTER — Other Ambulatory Visit (INDEPENDENT_AMBULATORY_CARE_PROVIDER_SITE_OTHER): Payer: Commercial Managed Care - PPO

## 2022-04-29 ENCOUNTER — Encounter (HOSPITAL_BASED_OUTPATIENT_CLINIC_OR_DEPARTMENT_OTHER): Payer: Self-pay | Admitting: Cardiology

## 2022-04-29 ENCOUNTER — Encounter: Payer: Self-pay | Admitting: Family Medicine

## 2022-04-29 ENCOUNTER — Ambulatory Visit (INDEPENDENT_AMBULATORY_CARE_PROVIDER_SITE_OTHER): Payer: Commercial Managed Care - PPO | Admitting: Cardiology

## 2022-04-29 VITALS — BP 142/96 | HR 89 | Ht 72.0 in | Wt 266.8 lb

## 2022-04-29 DIAGNOSIS — I10 Essential (primary) hypertension: Secondary | ICD-10-CM

## 2022-04-29 DIAGNOSIS — R002 Palpitations: Secondary | ICD-10-CM

## 2022-04-29 DIAGNOSIS — E782 Mixed hyperlipidemia: Secondary | ICD-10-CM | POA: Diagnosis not present

## 2022-04-29 DIAGNOSIS — E1165 Type 2 diabetes mellitus with hyperglycemia: Secondary | ICD-10-CM

## 2022-04-29 DIAGNOSIS — Z6836 Body mass index (BMI) 36.0-36.9, adult: Secondary | ICD-10-CM

## 2022-04-29 MED ORDER — CHLORTHALIDONE 25 MG PO TABS: 25.0000 mg | ORAL_TABLET | Freq: Every day | ORAL | 3 refills | Status: DC

## 2022-04-29 MED ORDER — AMLODIPINE BESYLATE 10 MG PO TABS: 10.0000 mg | ORAL_TABLET | Freq: Every day | ORAL | 3 refills | Status: DC

## 2022-04-29 NOTE — Addendum Note (Signed)
Addended by: Nathanial Millman E on: 04/29/2022 12:55 PM   Modules accepted: Orders

## 2022-04-29 NOTE — Patient Instructions (Signed)
Medication Instructions:  START CHLORTHALIDONE 25 MG DAILY   *If you need a refill on your cardiac medications before your next appointment, please call your pharmacy*  Lab Work: NONE   Testing/Procedures: 2 WEEK ZIO   Follow-Up: At [facility], you and your health needs are our priority.  As part of our continuing mission to provide you with exceptional heart care, we have created designated Provider Care Teams.  These Care Teams include your primary Cardiologist (physician) and Advanced Practice Providers (APPs -  Physician Assistants and Nurse Practitioners) who all work together to provide you with the care you need, when you need it.  We recommend signing up for the patient portal called "MyChart".  Sign up information is provided on this After Visit Summary.  MyChart is used to connect with patients for Virtual Visits (Telemedicine).  Patients are able to view lab/test results, encounter notes, upcoming appointments, etc.  Non-urgent messages can be sent to your provider as well.   To learn more about what you can do with MyChart, go to [email or web address].    Your next appointment:   6 week(s)  Provider:   Buford Dresser, MD   Other Instructions [facility]- Long Term Monitor Instructions  Your physician has requested you wear a ZIO patch monitor for 14 days.  This is a single patch monitor. [employer] supplies one patch monitor per enrollment. Additional stickers are not available. Please do not apply patch if you will be having a Nuclear Stress Test,  Echocardiogram, Cardiac CT, MRI, or Chest Xray during the period you would be wearing the  monitor. The patch cannot be worn during these tests. You cannot remove and re-apply the  ZIO XT patch monitor.  Your ZIO patch monitor will be mailed 3 day USPS to your address on file. It may take 3-5 days  to receive your monitor after you have been enrolled.  Once you have received your monitor, please review the enclosed  instructions. Your monitor  has already been registered assigning a specific monitor serial # to you.  Billing and Patient Assistance Program Information  We have supplied [employer] with any of your insurance information on file for billing purposes. [employer] offers a sliding scale Patient Assistance Program for patients that do not have  insurance, or whose insurance does not completely cover the cost of the ZIO monitor.  You must apply for the Patient Assistance Program to qualify for this discounted rate.  To apply, please call [employer] at [phone], select option 4, select option 2, ask to apply for  Patient Assistance Program. Theodore Demark will ask your household income, and how many people  are in your household. They will quote your out-of-pocket cost based on that information.  [employer] will also be able to set up a 49-month interest-free payment plan if needed.  Applying the monitor   Shave hair from upper left chest.  Hold abrader disc by orange tab. Rub abrader in 40 strokes over the upper left chest as  indicated in your monitor instructions.  Clean area with 4 enclosed alcohol pads. Let dry.  Apply patch as indicated in monitor instructions. Patch will be placed under collarbone on left  side of chest with arrow pointing upward.  Rub patch adhesive wings for 2 minutes. Remove white label marked "1". Remove the white  label marked "2". Rub patch adhesive wings for 2 additional minutes.  While looking in a mirror, press and release button in center of patch. A small green light will  flash 3-4 times. This will be your only indicator that the monitor has been turned on.  Do not shower for the first 24 hours. You may shower after the first 24 hours.  Press the button if you feel a symptom. You will hear a small click. Record Date, Time and  Symptom in the Patient Logbook.  When you are ready to remove the patch, follow instructions on the last 2 pages of Patient  Logbook. Stick patch  monitor onto the last page of Patient Logbook.  Place Patient Logbook in the blue and white box. Use locking tab on box and tape box closed  securely. The blue and white box has prepaid postage on it. Please place it in the mailbox as  soon as possible. Your physician should have your test results approximately 7 days after the  monitor has been mailed back to [facility].  Call [employer] at [phone] if you have questions regarding  your ZIO XT patch monitor. Call them immediately if you see an orange light blinking on your  monitor.  If your monitor falls off in less than 4 days, contact our Monitor department at [phone].  If your monitor becomes loose or falls off after 4 days call [employer] at [phone] for  suggestions on securing your monitor

## 2022-04-29 NOTE — Progress Notes (Signed)
Cardiology Office Note:    Date:  04/29/2022   ID:  Domenic Schwab, DOB 05-Dec-1970, MRN [id number]  PCP:  Farrel Conners, MD  Cardiologist:  Buford Dresser, MD  Referring MD: Farrel Conners, MD   CC: new patient consultation for palpitations  History of Present Illness:    MARCANGELO HEMMING is a 52 y.o. male with a hx of hypertension, diabetes, GERD who is seen as a new consult at the request of Farrel Conners, MD for the evaluation and management of palpitations.  Tachycardia/palpitations: -Initial onset: 3-4 months -Frequency/Duration: several times/day, lasts a few minutes -Aggravating/alleviating factors: happens more after he drinks/eats -Syncope/near syncope: none -Prior cardiac history: none -Prior workup: ordered for preventice monitor, has it at the house but hasn't worn it yet. -Prior treatment: none -Caffeine: no routine caffeine -Alcohol: whiskey, usually not during the week, sometimes on the weekends -Tobacco: former, rare cigar -Comorbidities: hypertension--started medication around age 11, checks at home with wrist cuff and runs 130s/90; type II diabetes. GERD since his 61s. Hyperlipidemia: has been on rosuvastatin for about 10 years. -Exercise level: works from home on a computer, does get up and walk circles (tries to get 30 min of walking), no intentional exercise otherwise -Labs: TSH, kidney function/electrolytes, CBC reviewed. -Cardiac ROS: no chest pain, no shortness of breath, no PND, no orthopnea, no LE edema. -Family history:  brother and sister both have arrhythmias (sounds like afib possibly), both mother and father's side have hypertension  Past Medical History:  Diagnosis Date   Anxiety    Asthma    Cecal ulcer    Diabetes mellitus ([place]) 2017   GERD (gastroesophageal reflux disease)    Hiatal hernia    Hypercholesterolemia    Hypertension     Past Surgical History:  Procedure Laterality Date   COLONOSCOPY  2015   EYE  SURGERY     04/2020- Retina repair bilateral eyes by Dr. Zigmund Daniel per patient    lasik Bilateral    POLYPECTOMY     TONSILLECTOMY AND ADENOIDECTOMY     UPPER GASTROINTESTINAL ENDOSCOPY     WISDOM TOOTH EXTRACTION      Current Medications: Current Outpatient Medications on File Prior to Visit  Medication Sig   albuterol (VENTOLIN HFA) 108 (90 Base) MCG/ACT inhaler Inhale 2 puffs into the lungs every 6 (six) hours as needed.   ALPRAZolam (XANAX) 0.5 MG tablet Take 0.5 mg by mouth as needed for anxiety or sleep.   amLODipine (NORVASC) 5 MG tablet Take 1.5 tablets (7.5 mg total) by mouth daily.   clonazePAM (KLONOPIN) 1 MG tablet Take 1 mg by mouth at bedtime.   empagliflozin (JARDIANCE) 10 MG TABS tablet TAKE ONE TABLET BY MOUTH DAILY BEFORE BREAKFAST   fenofibrate 54 MG tablet Take 1 tablet (54 mg total) by mouth daily.   JORNAY PM 20 MG CP24 Take 1 capsule by mouth at bedtime.   losartan (COZAAR) 100 MG tablet Take 1 tablet (100 mg total) by mouth daily.   metFORMIN (GLUCOPHAGE) 1000 MG tablet Take 1 tablet (1,000 mg total) by mouth 2 (two) times daily with a meal.   pantoprazole (PROTONIX) 40 MG tablet Take 1 tablet (40 mg total) by mouth 2 (two) times daily.   rosuvastatin (CRESTOR) 20 MG tablet Take 1 tablet (20 mg total) by mouth daily.   sildenafil (REVATIO) 20 MG tablet TAKE 3 TO 5 TABLETS BY MOUTH AS NEEDED PRIOR TO SEXUAL INTERCOURSE; DO NOT TAKE MORE THAN 1 DOSE IN  24 HOURS !   traZODone (DESYREL) 50 MG tablet Take 0.5-1 tablets (25-50 mg total) by mouth at bedtime as needed for sleep.   UNABLE TO FIND Med Name: striction D supplement BID   No current facility-administered medications on file prior to visit.     Allergies:   Ace inhibitors and Phenergan [promethazine hcl]   Social History   Tobacco Use   Smoking status: Former    Packs/day: 0.25    Years: 3.00    Total pack years: 0.75    Types: Cigarettes, Cigars   Smokeless tobacco: Never  Vaping Use   Vaping Use:  Never used  Substance Use Topics   Alcohol use: Yes    Alcohol/week: 3.0 standard drinks of alcohol    Types: 3 Glasses of wine per week   Drug use: No    Family History: family history includes Aneurysm in his father; Arthritis in his maternal grandmother; Breast cancer in his paternal grandmother; Colon cancer in his maternal grandmother; Colon polyps in his maternal grandmother and mother; Diabetes in his maternal grandmother; Heart disease in his maternal grandmother; High Cholesterol in his mother; High blood pressure in his maternal grandfather and mother; Lung cancer in his paternal grandmother; Obesity in his brother and sister; Prostate cancer in his maternal grandfather; Stomach cancer in his paternal grandmother. There is no history of Esophageal cancer or Rectal cancer.  ROS:   Please see the history of present illness.  Additional pertinent ROS: Constitutional: Negative for chills, fever, night sweats, unintentional weight loss  HENT: Negative for ear pain and hearing loss.   Eyes: Negative for loss of vision and eye pain.  Respiratory: Negative for cough, sputum, wheezing.   Cardiovascular: See HPI. Gastrointestinal: Negative for abdominal pain, melena, and hematochezia.  Genitourinary: Negative for dysuria and hematuria.  Musculoskeletal: Negative for falls and myalgias.  Skin: Negative for itching and rash.  Neurological: Negative for focal weakness, focal sensory changes and loss of consciousness.  Endo/Heme/Allergies: Does not bruise/bleed easily.     EKGs/Labs/Other Studies Reviewed:    The following studies were reviewed today: No prior cardiac studies  EKG:  EKG is personally reviewed.   04/29/22: NSR at 89 bpm  Recent Labs: 03/29/2022: ALT 47; BUN 13; Creatinine, Ser 1.10; Hemoglobin 17.6; Platelets 267.0; Potassium 4.5; Sodium 138; TSH 3.94  Recent Lipid Panel    Component Value Date/Time   CHOL 226 (H) 03/29/2022 0858   TRIG 296.0 (H) 03/29/2022 0858    HDL 43.50 03/29/2022 0858   CHOLHDL 5 03/29/2022 0858   VLDL 59.2 (H) 03/29/2022 0858   LDLCALC 45 05/13/2020 0929   LDLCALC 105 (H) 11/05/2019 0916   LDLDIRECT 130.0 03/29/2022 0858    Physical Exam:    VS:  BP (!) 152/108 (BP Location: Left Arm, Patient Position: Sitting, Cuff Size: Large)   Pulse 89   Ht 6' (1.829 m)   Wt 266 lb 12.8 oz (121 kg)   BMI 36.18 kg/m     Wt Readings from Last 3 Encounters:  04/29/22 266 lb 12.8 oz (121 kg)  03/29/22 258 lb 4.8 oz (117.2 kg)  04/02/21 248 lb 8 oz (112.7 kg)    GEN: Well nourished, well developed in no acute distress HEENT: Normal, moist mucous membranes NECK: No JVD CARDIAC: regular rhythm, normal S1 and S2, no rubs or gallops. No murmur. VASCULAR: Radial and DP pulses 2+ bilaterally. No carotid bruits RESPIRATORY:  Clear to auscultation without rales, wheezing or rhonchi  ABDOMEN: Soft, non-tender,  non-distended MUSCULOSKELETAL:  Ambulates independently SKIN: Warm and dry, no edema NEUROLOGIC:  Alert and oriented x 3. No focal neuro deficits noted. PSYCHIATRIC:  Normal affect    ASSESSMENT:    1. Heart palpitations   2. Primary hypertension   3. Type 2 diabetes mellitus with hyperglycemia, without long-term current use of insulin ([place])   4. Mixed hyperlipidemia   5. Class 2 severe obesity due to excess calories with serious comorbidity and body mass index (BMI) of 36.0 to 36.9 in adult [facility])    PLAN:    Palpitations -will order 14 day Zio (prefers this over 30 day preventice) -reviewed red flag warning signs that need immediate medical attention  Hypertension -elevated today -on amlodipine 10 mg, losartan 100 mg daily -adding chlorthalidone today -goal <130/80 given comorbidities  Mixed hyperlipidemia -TG 296, LDL 130 -was on rosuvastatin at the time, recently started on fenofibrate  Type II diabetes -on Jardiance, metformin  Obesity -BMI 36 -Peak weight 305 lbs, dropped to 250 with keto diet, now 266  lbs.  Cardiac risk counseling and prevention recommendations: -recommend heart healthy/Mediterranean diet, with whole grains, fruits, vegetable, fish, lean meats, nuts, and olive oil. Limit salt. -recommend moderate walking, 3-5 times/week for 30-50 minutes each session. Aim for at least 150 minutes.week. Goal should be pace of 3 miles/hours, or walking 1.5 miles in 30 minutes -recommend avoidance of tobacco products. Avoid excess alcohol. -ASCVD risk score: The 10-year ASCVD risk score (Arnett DK, et al., 2019) is: 12.8%   Values used to calculate the score:     Age: 80 years     Sex: Male     Is Non-Hispanic African American: No     Diabetic: Yes     Tobacco smoker: No     Systolic Blood Pressure: 0000000 mmHg     Is BP treated: Yes     HDL Cholesterol: 43.5 mg/dL     Total Cholesterol: 226 mg/dL    Plan for follow up: 6 weeks  Buford Dresser, MD, PhD, [facility]    Medication Adjustments/Labs and Tests Ordered: Current medicines are reviewed at length with the patient today.  Concerns regarding medicines are outlined above.  Orders Placed This Encounter  Procedures   LONG TERM MONITOR (3-14 DAYS)   EKG 12-Lead   Meds ordered this encounter  Medications   amLODipine (NORVASC) 10 MG tablet    Sig: Take 1 tablet (10 mg total) by mouth daily.    Dispense:  90 tablet    Refill:  3   chlorthalidone (HYGROTON) 25 MG tablet    Sig: Take 1 tablet (25 mg total) by mouth daily.    Dispense:  90 tablet    Refill:  3    Patient Instructions  Medication Instructions:  START CHLORTHALIDONE 25 MG DAILY   *If you need a refill on your cardiac medications before your next appointment, please call your pharmacy*  Lab Work: NONE   Testing/Procedures: 2 WEEK ZIO   Follow-Up: At [facility], you and your health needs are our priority.  As part of our continuing mission to provide you with exceptional heart care, we have created designated  Provider Care Teams.  These Care Teams include your primary Cardiologist (physician) and Advanced Practice Providers (APPs -  Physician Assistants and Nurse Practitioners) who all work together to provide you with the care you need, when you need it.  We recommend signing up for the patient portal called "MyChart".  Sign  up information is provided on this After Visit Summary.  MyChart is used to connect with patients for Virtual Visits (Telemedicine).  Patients are able to view lab/test results, encounter notes, upcoming appointments, etc.  Non-urgent messages can be sent to your provider as well.   To learn more about what you can do with MyChart, go to [email or web address].    Your next appointment:   6 week(s)  Provider:   Buford Dresser, MD   Other Instructions Bryn Gulling- Long Term Monitor Instructions  Your physician has requested you wear a ZIO patch monitor for 14 days.  This is a single patch monitor. [employer] supplies one patch monitor per enrollment. Additional stickers are not available. Please do not apply patch if you will be having a Nuclear Stress Test,  Echocardiogram, Cardiac CT, MRI, or Chest Xray during the period you would be wearing the  monitor. The patch cannot be worn during these tests. You cannot remove and re-apply the  ZIO XT patch monitor.  Your ZIO patch monitor will be mailed 3 day USPS to your address on file. It may take 3-5 days  to receive your monitor after you have been enrolled.  Once you have received your monitor, please review the enclosed instructions. Your monitor  has already been registered assigning a specific monitor serial # to you.  Billing and Patient Assistance Program Information  We have supplied [employer] with any of your insurance information on file for billing purposes. [employer] offers a sliding scale Patient Assistance Program for patients that do not have  insurance, or whose insurance does not completely cover the cost of the  ZIO monitor.  You must apply for the Patient Assistance Program to qualify for this discounted rate.  To apply, please call [employer] at [phone], select option 4, select option 2, ask to apply for  Patient Assistance Program. Theodore Demark will ask your household income, and how many people  are in your household. They will quote your out-of-pocket cost based on that information.  [employer] will also be able to set up a 71-month interest-free payment plan if needed.  Applying the monitor   Shave hair from upper left chest.  Hold abrader disc by orange tab. Rub abrader in 40 strokes over the upper left chest as  indicated in your monitor instructions.  Clean area with 4 enclosed alcohol pads. Let dry.  Apply patch as indicated in monitor instructions. Patch will be placed under collarbone on left  side of chest with arrow pointing upward.  Rub patch adhesive wings for 2 minutes. Remove white label marked "1". Remove the white  label marked "2". Rub patch adhesive wings for 2 additional minutes.  While looking in a mirror, press and release button in center of patch. A small green light will  flash 3-4 times. This will be your only indicator that the monitor has been turned on.  Do not shower for the first 24 hours. You may shower after the first 24 hours.  Press the button if you feel a symptom. You will hear a small click. Record Date, Time and  Symptom in the Patient Logbook.  When you are ready to remove the patch, follow instructions on the last 2 pages of Patient  Logbook. Stick patch monitor onto the last page of Patient Logbook.  Place Patient Logbook in the blue and white box. Use locking tab on box and tape box closed  securely. The blue and white box has prepaid postage on it. Please place it  in the mailbox as  soon as possible. Your physician should have your test results approximately 7 days after the  monitor has been mailed back to [facility].  Call [place] at [phone] if you have questions regarding  your ZIO XT patch monitor. Call them immediately if you see an orange light blinking on your  monitor.  If your monitor falls off in less than 4 days, contact our Monitor department at [phone].  If your monitor becomes loose or falls off after 4 days call [employer] at [phone] for  suggestions on securing your monitor    Signed, Buford Dresser, MD PhD 04/29/2022     [place]

## 2022-05-06 ENCOUNTER — Telehealth: Payer: Self-pay | Admitting: Cardiology

## 2022-05-06 NOTE — Telephone Encounter (Signed)
Patient has a monitor that his PCP order for him that he would like to return.  He state he never opened the box.  He wants to know how he can go about returning it. Please advise.

## 2022-05-09 NOTE — Telephone Encounter (Signed)
Left detailed message, ok per DPR would need to reach out to PCP or call the number on box to determine how to return

## 2022-05-09 NOTE — Telephone Encounter (Signed)
Left message to call back  

## 2022-05-24 ENCOUNTER — Other Ambulatory Visit: Payer: Self-pay | Admitting: Family Medicine

## 2022-05-24 DIAGNOSIS — E1165 Type 2 diabetes mellitus with hyperglycemia: Secondary | ICD-10-CM

## 2022-06-13 ENCOUNTER — Ambulatory Visit (HOSPITAL_BASED_OUTPATIENT_CLINIC_OR_DEPARTMENT_OTHER): Payer: Commercial Managed Care - PPO | Admitting: Cardiology

## 2022-06-15 ENCOUNTER — Ambulatory Visit: Payer: Commercial Managed Care - PPO | Admitting: Nurse Practitioner

## 2022-06-15 NOTE — Progress Notes (Deleted)
Office Visit    Patient Name: Michael Jacobs Date of Encounter: 06/15/2022  Primary Care Provider:  Farrel Conners, MD Primary Cardiologist:  Michael Dresser, MD  Chief Complaint    52 year old male with a history of palpitations, PSVT, hypertension, hyperlipidemia, type 2 diabetes, asthma, anxiety, and GERD who presents for follow-up related to palpitations.  Past Medical History    Past Medical History:  Diagnosis Date   Anxiety    Asthma    Cecal ulcer    Diabetes mellitus ([place]) 2017   GERD (gastroesophageal reflux disease)    Hiatal hernia    Hypercholesterolemia    Hypertension    Past Surgical History:  Procedure Laterality Date   COLONOSCOPY  2015   EYE SURGERY     04/2020- Retina repair bilateral eyes by Dr. Zigmund Jacobs per patient    lasik Bilateral    POLYPECTOMY     TONSILLECTOMY AND ADENOIDECTOMY     UPPER GASTROINTESTINAL ENDOSCOPY     WISDOM TOOTH EXTRACTION      Allergies  Allergies  Allergen Reactions   Ace Inhibitors Cough   Phenergan [Promethazine Hcl] Anxiety     Labs/Other Studies Reviewed    The following studies were reviewed today: Zio 05-Jun-2022:   Patch Wear Time:  13 days and 21 hours (2024-01-19T12:03:44-0500 to 2024-02-02T09:13:21-0500)   [name], [name], [name]. Predominant underlying rhythm was Sinus Rhythm. Slight P wave morphology changes were noted. 1 run of Ventricular Tachycardia occurred lasting 4 beats with a max rate of 133 bpm  (avg 128 bpm). 1684 Supraventricular Tachycardia runs occurred, the run with the fastest interval lasting 13.0 secs with a max rate of 218 bpm, the longest lasting 2 mins 12 secs with an avg rate of 139 bpm. Some episodes of Supraventricular Tachycardia may be possible Atrial Tachycardia with variable block. Supraventricular Tachycardia was detected within +/- 45 seconds of symptomatic patient event(s). Isolated SVEs were occasional (1.7%, 32150),  SVE Couplets were rare (<1.0%, 3954), and SVE Triplets  were rare (<1.0%, 1263). Isolated VEs were rare (<1.0%), and no VE Couplets or VE Triplets were present. Difficulty discerning atrial activity making definitive diagnosis difficult to ascertain.  Recent Labs: 03/29/2022: ALT 47; BUN 13; Creatinine, Ser 1.10; Hemoglobin 17.6; Platelets 267.0; Potassium 4.5; Sodium 138; TSH 3.94  Recent Lipid Panel    Component Value Date/Time   CHOL 226 (H) 03/29/2022 0858   TRIG 296.0 (H) 03/29/2022 0858   HDL 43.50 03/29/2022 0858   CHOLHDL 5 03/29/2022 0858   VLDL 59.2 (H) 03/29/2022 0858   LDLCALC 45 05/13/2020 0929   LDLCALC 105 (H) 11/05/2019 0916   LDLDIRECT 130.0 03/29/2022 0858    History of Present Illness    52 year old male with the above past medical history including palpitations, PSVT, hypertension, hyperlipidemia, type 2 diabetes, asthma, anxiety, and GERD.  He was referred to Dr. Harrell Jacobs in January 2024 in the setting of 3 to 35-monthhistory of frequent palpitations.  He was last seen in the office on 04/29/2022 and noted a 3 to 476-monthistory of daily palpitations occurring several times a day.  BP was elevated.  He was started on chlorthalidone.  14-day ZIO preliminary results revealed dominantly sinus rhythm, 1 run NSVT, 1684 runs of SVT max heart rate of 218 bpm, longest lasting 2 minutes 12 seconds, rare PACs and PVCs.  Patient symptomatic within +/-45 seconds of SVT.   He presents  today for follow-up.  Since his last visit  SVT/palpitations: Hypertension: Hyperlipidemia: Type 2 diabetes/obesity: Disposition:   Home Medications    Current Outpatient Medications  Medication Sig Dispense Refill   albuterol (VENTOLIN HFA) 108 (90 Base) MCG/ACT inhaler Inhale 2 puffs into the lungs every 6 (six) hours as needed. 8.5 g 2   ALPRAZolam (XANAX) 0.5 MG tablet Take 0.5 mg by mouth as needed for anxiety or sleep.     amLODipine (NORVASC) 10 MG tablet Take 1 tablet (10 mg  total) by mouth daily. 90 tablet 3   chlorthalidone (HYGROTON) 25 MG tablet Take 1 tablet (25 mg total) by mouth daily. 90 tablet 3   clonazePAM (KLONOPIN) 1 MG tablet Take 1 mg by mouth at bedtime.     empagliflozin (JARDIANCE) 10 MG TABS tablet TAKE ONE TABLET BY MOUTH DAILY BEFORE BREAKFAST 90 tablet 3   fenofibrate 54 MG tablet Take 1 tablet (54 mg total) by mouth daily. 90 tablet 3   JORNAY PM 20 MG CP24 Take 1 capsule by mouth at bedtime.     losartan (COZAAR) 100 MG tablet Take 1 tablet (100 mg total) by mouth daily. 90 tablet 3   metFORMIN (GLUCOPHAGE) 1000 MG tablet TAKE 1 TABLET BY MOUTH TWICE A DAY WITH A MEAL; **MUST CALL MD FOR APPOINTMENT FOR FURTHER REFILLS 180 tablet 1   pantoprazole (PROTONIX) 40 MG tablet Take 1 tablet (40 mg total) by mouth 2 (two) times daily. 90 tablet 3   rosuvastatin (CRESTOR) 20 MG tablet Take 1 tablet (20 mg total) by mouth daily. 90 tablet 3   sildenafil (REVATIO) 20 MG tablet TAKE 3 TO 5 TABLETS BY MOUTH AS NEEDED PRIOR TO SEXUAL INTERCOURSE; DO NOT TAKE MORE THAN 1 DOSE IN 24 HOURS ! 90 tablet 0   traZODone (DESYREL) 50 MG tablet Take 0.5-1 tablets (25-50 mg total) by mouth at bedtime as needed for sleep. 90 tablet 0   UNABLE TO FIND Med Name: striction D supplement BID     No current facility-administered medications for this visit.     Review of Systems    ***.  All other systems reviewed and are otherwise negative except as noted above.    Physical Exam    VS:  There were no vitals taken for this visit. , BMI There is no height or weight on file to calculate BMI.     GEN: Well nourished, well developed, in no acute distress. HEENT: normal. Neck: Supple, no JVD, carotid bruits, or masses. Cardiac: RRR, no murmurs, rubs, or gallops. No clubbing, cyanosis, edema.  Radials/DP/PT 2+ and equal bilaterally.  Respiratory:  Respirations regular and unlabored, clear to auscultation bilaterally. GI: Soft, nontender, nondistended, BS + x 4. MS: no  deformity or atrophy. Skin: warm and dry, no rash. Neuro:  Strength and sensation are intact. Psych: Normal affect.  Accessory Clinical Findings    ECG personally reviewed by me today - *** - no acute changes.   Lab Results  Component Value Date   WBC 8.7 03/29/2022   HGB 17.6 (H) 03/29/2022   HCT 51.2 03/29/2022   MCV 88.8 03/29/2022   PLT 267.0 03/29/2022   Lab Results  Component Value Date   CREATININE 1.10 03/29/2022   BUN 13 03/29/2022   NA 138 03/29/2022   K 4.5 03/29/2022   CL 100 03/29/2022   CO2 28 03/29/2022   Lab Results  Component Value Date   ALT 47 03/29/2022   AST 25 03/29/2022  ALKPHOS 82 03/29/2022   BILITOT 1.4 (H) 03/29/2022   Lab Results  Component Value Date   CHOL 226 (H) 03/29/2022   HDL 43.50 03/29/2022   LDLCALC 45 05/13/2020   LDLDIRECT 130.0 03/29/2022   TRIG 296.0 (H) 03/29/2022   CHOLHDL 5 03/29/2022    Lab Results  Component Value Date   HGBA1C 6.7 (A) 03/29/2022    Assessment & Plan    1.  ***  No BP recorded.  {Refresh Note OR Click here to enter BP  :1}***   Lenna Sciara, NP 06/15/2022, 5:57 AM

## 2022-06-21 ENCOUNTER — Encounter: Payer: Self-pay | Admitting: Nurse Practitioner

## 2022-06-21 ENCOUNTER — Ambulatory Visit: Payer: BC Managed Care – PPO | Attending: Nurse Practitioner | Admitting: Nurse Practitioner

## 2022-06-21 VITALS — BP 130/90 | HR 89 | Ht 72.0 in | Wt 262.4 lb

## 2022-06-21 DIAGNOSIS — R002 Palpitations: Secondary | ICD-10-CM | POA: Diagnosis not present

## 2022-06-21 DIAGNOSIS — I471 Supraventricular tachycardia, unspecified: Secondary | ICD-10-CM | POA: Diagnosis not present

## 2022-06-21 DIAGNOSIS — I10 Essential (primary) hypertension: Secondary | ICD-10-CM

## 2022-06-21 DIAGNOSIS — R072 Precordial pain: Secondary | ICD-10-CM

## 2022-06-21 DIAGNOSIS — Z6836 Body mass index (BMI) 36.0-36.9, adult: Secondary | ICD-10-CM

## 2022-06-21 DIAGNOSIS — E1165 Type 2 diabetes mellitus with hyperglycemia: Secondary | ICD-10-CM

## 2022-06-21 DIAGNOSIS — G4733 Obstructive sleep apnea (adult) (pediatric): Secondary | ICD-10-CM

## 2022-06-21 DIAGNOSIS — E782 Mixed hyperlipidemia: Secondary | ICD-10-CM

## 2022-06-21 MED ORDER — METOPROLOL TARTRATE 25 MG PO TABS: 12.5000 mg | ORAL_TABLET | Freq: Two times a day (BID) | ORAL | 3 refills | Status: DC

## 2022-06-21 MED ORDER — METOPROLOL TARTRATE 100 MG PO TABS: ORAL_TABLET | ORAL | 0 refills | Status: DC

## 2022-06-21 NOTE — Patient Instructions (Signed)
Medication Instructions:  Start Metoprolol Tartrate 12.5 mg twice daily. Metoprolol Tartrate 100 mg one tab 2 hours prior to CT.   *If you need a refill on your cardiac medications before your next appointment, please call your pharmacy*   Lab Work: Your physician recommends that you return for lab work 1 week before CT. BMET   If you have labs (blood work) drawn today and your tests are completely normal, you will receive your results only by: [place] (if you have MyChart) OR A paper copy in the mail If you have any lab test that is abnormal or we need to change your treatment, we will call you to review the results.   Testing/Procedures: Your physician has requested that you have an echocardiogram. Echocardiography is a painless test that uses sound waves to create images of your heart. It provides your doctor with information about the size and shape of your heart and how well your heart's chambers and valves are working. This procedure takes approximately one hour. There are no restrictions for this procedure. Please do NOT wear cologne, perfume, aftershave, or lotions (deodorant is allowed). Please arrive 15 minutes prior to your appointment time.    Your cardiac CT will be scheduled at one of the below locations:   [facility] [address] [place], [place] [address] [phone]) [facility] [address] [place], [place] [address] [phone]  [facility] [place], [place] [address] [phone]  If scheduled at [facility], please arrive at the [facility] and Children's Entrance (Entrance C2) of [facility] 30 minutes prior to test start time. You can use the FREE valet parking offered at entrance C (encouraged to control the heart rate for the test)  Proceed to the [facility] Radiology Department (first floor) to check-in and test  prep.  All radiology patients and guests should use entrance C2 at [facility], accessed from [facility], even though the hospital's physical address listed is [address]    If scheduled at [facility] or [facility], please arrive 15 mins early for check-in and test prep.   Please follow these instructions carefully (unless otherwise directed):  Hold all erectile dysfunction medications at least 3 days (72 hrs) prior to test. (Ie viagra, cialis, sildenafil, tadalafil, etc) We will administer nitroglycerin during this exam.   On the Night Before the Test: Be sure to Drink plenty of water. Do not consume any caffeinated/decaffeinated beverages or chocolate 12 hours prior to your test. Do not take any antihistamines 12 hours prior to your test. If the patient has contrast allergy: Patient will need a prescription for Prednisone and very clear instructions (as follows): Prednisone 50 mg - take 13 hours prior to test Take another Prednisone 50 mg 7 hours prior to test Take another Prednisone 50 mg 1 hour prior to test Take Benadryl 50 mg 1 hour prior to test Patient must complete all four doses of above prophylactic medications. Patient will need a ride after test due to Benadryl.  On the Day of the Test: Drink plenty of water until 1 hour prior to the test. Do not eat any food 1 hour prior to test. You may take your regular medications prior to the test.  Take metoprolol (Lopressor) two hours prior to test. If you take Furosemide/Hydrochlorothiazide/Spironolactone, please HOLD on the morning of  the test. FEMALES- please wear underwire-free bra if available, avoid dresses & tight clothing   *For Clinical Staff only. Please instruct patient the following:* Heart Rate Medication Recommendations for Cardiac CT  Resting HR < 50 bpm  No medication  Resting HR 50-60 bpm and BP >110/50 mmHG   Consider  Metoprolol tartrate 25 mg PO 90-120 min prior to scan  Resting HR 60-65 bpm and BP >110/50 mmHG  Metoprolol tartrate 50 mg PO 90-120 minutes prior to scan   Resting HR > 65 bpm and BP >110/50 mmHG  Metoprolol tartrate 100 mg PO 90-120 minutes prior to scan  Consider Ivabradine 10-15 mg PO or a calcium channel blocker for resting HR >60 bpm and contraindication to metoprolol tartrate  Consider Ivabradine 10-15 mg PO in combination with metoprolol tartrate for HR >80 bpm         After the Test: Drink plenty of water. After receiving IV contrast, you may experience a mild flushed feeling. This is normal. On occasion, you may experience a mild rash up to 24 hours after the test. This is not dangerous. If this occurs, you can take Benadryl 25 mg and increase your fluid intake. If you experience trouble breathing, this can be serious. If it is severe call 911 IMMEDIATELY. If it is mild, please call our office. If you take any of these medications: Glipizide/Metformin, Avandament, Glucavance, please do not take 48 hours after completing test unless otherwise instructed.  We will call to schedule your test 2-4 weeks out understanding that some insurance companies will need an authorization prior to the service being performed.   For non-scheduling related questions, please contact the cardiac imaging nurse navigator should you have any questions/concerns: Marchia Bond, Cardiac Imaging Nurse Navigator Gordy Clement, Cardiac Imaging Nurse Navigator [facility] Direct Office Dial: [phone]   For scheduling needs, including cancellations and rescheduling, please call [place], [phone].     Follow-Up: At [facility], you and your health needs are our priority.  As part of our continuing mission to provide you with exceptional heart care, we have created designated Provider Care Teams.  These Care Teams include your primary Cardiologist (physician) and  Advanced Practice Providers (APPs -  Physician Assistants and Nurse Practitioners) who all work together to provide you with the care you need, when you need it.  We recommend signing up for the patient portal called "MyChart".  Sign up information is provided on this After Visit Summary.  MyChart is used to connect with patients for Virtual Visits (Telemedicine).  Patients are able to view lab/test results, encounter notes, upcoming appointments, etc.  Non-urgent messages can be sent to your provider as well.   To learn more about what you can do with MyChart, go to [email or web address].    Your next appointment:   6-8 week(s)  Provider:   Diona Browner, NP        Other Instructions Supraventricular Tachycardia, Adult Supraventricular tachycardia (SVT) is a type of abnormal heart rhythm. It causes the heart to beat very quickly. SVT can start suddenly and last for a short time, which is called paroxysmal SVT, or it may last longer and require specialized treatment to return the heart rhythm to normal. A normal resting heart rate is 60-100 beats per minute. During an episode of SVT, your heart rate may be higher than 150 beats per minute. Episodes of SVT can be frightening, but they are usually not dangerous. However, if episodes happen several times  a day or last longer than a few seconds, they may lead to heart failure. What are the causes?  Usually, a normal heartbeat starts when an area called the sinoatrial node releases an electrical signal. In SVT, other areas of the heart send out electrical signals that interfere with the signal from the sinoatrial node. The cause of this abnormal electrical activity is not known. What increases the risk? You are more likely to develop this condition if you are: Middle aged or younger. Male. The following factors may also make you more likely to develop this condition: Stress or anxiety. Tiredness. Smoking. Stimulant drugs, such as cocaine and  methamphetamine. Alcohol. Caffeine. Pregnancy. Having any of these conditions: A thyroid condition. Diabetes mellitus. Obstructive sleep apnea. What are the signs or symptoms? Symptoms of this condition include: A pounding heart. A feeling that the heart is skipping beats (palpitations). Weakness. Shortness of breath. Tightness or pain in your chest. Light-headedness or dizziness. Anxiety. Sweating. Nausea. Fainting. Fatigue or tiredness. A mild episode may not cause symptoms. How is this diagnosed? This condition may be diagnosed based on: Your symptoms. A physical exam. If you have an episode of SVT during the exam, the health care provider may be able to diagnose SVT by listening to your heart and feeling your pulse. Tests. These may include: An electrocardiogram (ECG). This test is done to check for problems with electrical activity in the heart. A Holter monitor or event monitor test. This test involves wearing a portable device that monitors your heart rate over time. An echocardiogram. This test involves taking an image of your heart using sound waves. It is done to rule out other causes of a fast heart rate. A stress echocardiogram. This test involves doing an echocardiogram when you are at rest and after exercise. Blood tests. An electrophysiology study (EPS). This tests the electrical activity in your heart to find where the abnormal heart rhythm is coming from using cardiac catheters. How is this treated? This condition may be treated with: Vagal nerve stimulation. This involves stimulating your vagus nerve, which is a nerve that runs from the chest, through the neck, to the lower part of the brain. Stimulating this nerve can slow down the heart. It is often the first and only treatment that is needed for this condition. Work with your health care provider to find which technique works best for you. Ways to do this treatment include: Laying on your back, then holding  your breath and pushing, as though you are having a bowel movement. Massaging an area on one side of your neck, below your jaw. Do not try this yourself. Only a health care provider should do this. If done the wrong way, it can lead to a stroke. Bending forward with your head between your legs. Coughing while bending forward with your head between your legs. Applying an ice-cold, wet towel to your face. Medicines that prevent attacks. Medicine to stop an attack. The medicine is given through an IV at the hospital. A small electric shock (cardioversion) that stops an attack. Before you get the shock, you will get medicine to make you fall asleep. Radiofrequency ablation. In this procedure, a small, thin tube (catheter) is used to send radiofrequency energy to the area of tissue that is causing the rapid heartbeats. The energy kills the cells and helps your heart keep a normal rhythm. You may have this treatment if you have symptoms of SVT often. If you do not have symptoms, you may not  need treatment. Follow these instructions at home: Stress Avoid stressful situations when possible. Find healthy ways of managing stress, such as: Taking part in relaxing activities, such as yoga, meditation, or being out in nature. Listening to relaxing music. Practicing relaxation techniques, such as deep breathing. Leading a healthy lifestyle. This involves getting plenty of sleep, exercising, and eating a balanced diet. Attending counseling or talk therapy with a mental health professional. Lifestyle  Try to get at least 7 hours of sleep each night. Do not use any products that contain nicotine or tobacco. These products include cigarettes, chewing tobacco, and vaping devices, such as e-cigarettes. If you need help quitting, ask your health care provider. Do not drink alcohol if it triggers episodes of SVT. If alcohol does not seem to trigger episodes, limit your alcohol intake. If you drink alcohol: Limit  how much you have to: 0-1 drink a day for women who are not pregnant. 0-2 drinks a day for men. Know how much alcohol is in your drink. In the [place] one drink equals one 12 oz bottle of beer (355 mL), one 5 oz glass of wine (148 mL), or one 1 oz glass of hard liquor (44 mL). Be aware of how caffeine affects your condition. If caffeine: Triggers episodes of SVT, do not eat, drink, or use anything with caffeine in it. Does not seem to trigger episodes, consume caffeine in moderation. Do not use stimulant drugs. If you need help quitting, talk with your health care provider. General instructions Maintain a healthy weight. Exercise regularly. Ask your health care provider to suggest some good activities for you. Aim for one or a combination of the following: 150 minutes per week of moderate exercise, such as walking or yoga. 75 minutes per week of vigorous exercise, such as running or swimming. Perform vagus nerve stimulation as directed by your health care provider. Take over-the-counter and prescription medicines only as told by your health care provider. Keep all follow-up visits. This is important. Contact a health care provider if: You have episodes of SVT more often than before. Episodes of SVT last longer than before. Vagus nerve stimulation is no longer helping. You have new symptoms. Get help right away if: You have chest pain. Your symptoms get worse. You have trouble breathing. You have an episode of SVT that lasts longer than 20 minutes. You faint. These symptoms may represent a serious problem that is an emergency. Do not wait to see if the symptoms will go away. Get medical help right away. Call your local emergency services (911 in the [place] Do not drive yourself to the hospital. Summary Supraventricular tachycardia (SVT) is a type of abnormal heart rhythm. During an episode of SVT, your heart rate may be higher than 150 beats per minute. If you do not have symptoms, you  may not need treatment. This information is not intended to replace advice given to you by your health care provider. Make sure you discuss any questions you have with your health care provider. Document Revised: 11/09/2019 Document Reviewed: 11/09/2019 Elsevier Patient Education  [place].

## 2022-06-21 NOTE — Progress Notes (Signed)
Office Visit    Patient Name: Michael Jacobs Date of Encounter: 06/21/2022  Primary Care Provider:  Farrel Conners, MD Primary Cardiologist:  Buford Dresser, MD  Chief Complaint    52 year old male with a history of palpitations, PSVT, hypertension, hyperlipidemia, type 2 diabetes, asthma, anxiety,  OSA, ADHD, and GERD who presents for follow-up related to palpitations.  Past Medical History    Past Medical History:  Diagnosis Date   Anxiety    Asthma    Cecal ulcer    Diabetes mellitus ([facility]) 2017   GERD (gastroesophageal reflux disease)    Hiatal hernia    Hypercholesterolemia    Hypertension    Past Surgical History:  Procedure Laterality Date   COLONOSCOPY  2015   EYE SURGERY     04/2020- Retina repair bilateral eyes by Dr. Zigmund Daniel per patient    lasik Bilateral    POLYPECTOMY     TONSILLECTOMY AND ADENOIDECTOMY     UPPER GASTROINTESTINAL ENDOSCOPY     WISDOM TOOTH EXTRACTION      Allergies  Allergies  Allergen Reactions   Ace Inhibitors Cough   Phenergan [Promethazine Hcl] Anxiety     Labs/Other Studies Reviewed    The following studies were reviewed today: ZIO Jun 17, 2022 (preliminary result):  Patch Wear Time:  13 days and 21 hours (2024-01-19T12:03:44-0500 to 2024-02-02T09:13:21-0500)   [name], [name], [name]. Predominant underlying rhythm was Sinus Rhythm. Slight P wave morphology changes were noted. 1 run of Ventricular Tachycardia occurred lasting 4 beats with a max rate of 133 bpm  (avg 128 bpm). 1684 Supraventricular Tachycardia runs occurred, the run with the fastest interval lasting 13.0 secs with a max rate of 218 bpm, the longest lasting 2 mins 12 secs with an avg rate of 139 bpm. Some episodes of Supraventricular Tachycardia  may be possible Atrial Tachycardia with variable block. Supraventricular Tachycardia was detected within +/- 45 seconds of symptomatic patient event(s). Isolated  SVEs were occasional (1.7%, 32150), SVE Couplets were rare (<1.0%, 3954), and SVE Triplets  were rare (<1.0%, 1263). Isolated VEs were rare (<1.0%), and no VE Couplets or VE Triplets were present. Difficulty discerning atrial activity making definitive diagnosis difficult to ascertain.  Recent Labs: 03/29/2022: ALT 47; BUN 13; Creatinine, Ser 1.10; Hemoglobin 17.6; Platelets 267.0; Potassium 4.5; Sodium 138; TSH 3.94  Recent Lipid Panel    Component Value Date/Time   CHOL 226 (H) 03/29/2022 0858   TRIG 296.0 (H) 03/29/2022 0858   HDL 43.50 03/29/2022 0858   CHOLHDL 5 03/29/2022 0858   VLDL 59.2 (H) 03/29/2022 0858   LDLCALC 45 05/13/2020 0929   LDLCALC 105 (H) 11/05/2019 0916   LDLDIRECT 130.0 03/29/2022 0858    History of Present Illness    52 year old male with the above past medical history including palpitations, PSVT, hypertension, hyperlipidemia, type 2 diabetes, asthma, anxiety, OSA, ADHD and GERD.  He was referred to Dr. Harrell Gave in 04/2022 in the setting of palpitations.  He was last seen in our 04/29/2022 and noted a several month history of  daily palpitations.  14-day ZIO preliminary results revealed predominantly sinus rhythm, 1 run of NSVT, 1684 runs of SVT, fastest interval lasting 13 seconds with a max heart rate of 218 bpm, longest lasting 2 minutes and 12 seconds with an average heart rate of 139 bpm.  SVT was detected within +/- 45 seconds of symptomatic patient events were PACs  and PVCs.    He presents today for follow-up. Since his last visit he has been stable overall from a cardiac standpoint. He continues to note daily palpitations, intermittent chest pressure, and increased fatigue.  He denies dyspnea, dizziness, presyncope, syncope, edema, PND, orthopnea, weight gain.   Home Medications    Current Outpatient Medications  Medication Sig Dispense Refill   albuterol (VENTOLIN HFA) 108 (90 Base) MCG/ACT inhaler Inhale 2 puffs into the lungs every 6 (six) hours as  needed. 8.5 g 2   ALPRAZolam (XANAX) 0.5 MG tablet Take 0.5 mg by mouth as needed for anxiety or sleep.     amLODipine (NORVASC) 10 MG tablet Take 1 tablet (10 mg total) by mouth daily. 90 tablet 3   chlorthalidone (HYGROTON) 25 MG tablet Take 1 tablet (25 mg total) by mouth daily. 90 tablet 3   clonazePAM (KLONOPIN) 1 MG tablet Take 1 mg by mouth at bedtime.     empagliflozin (JARDIANCE) 10 MG TABS tablet TAKE ONE TABLET BY MOUTH DAILY BEFORE BREAKFAST 90 tablet 3   fenofibrate 54 MG tablet Take 1 tablet (54 mg total) by mouth daily. 90 tablet 3   JORNAY PM 20 MG CP24 Take 1 capsule by mouth at bedtime.     losartan (COZAAR) 100 MG tablet Take 1 tablet (100 mg total) by mouth daily. 90 tablet 3   metFORMIN (GLUCOPHAGE) 1000 MG tablet TAKE 1 TABLET BY MOUTH TWICE A DAY WITH A MEAL; **MUST CALL MD FOR APPOINTMENT FOR FURTHER REFILLS 180 tablet 1   metoprolol tartrate (LOPRESSOR) 100 MG tablet Take 1 tab 2 hours prior to CT. 1 tablet 0   metoprolol tartrate (LOPRESSOR) 25 MG tablet Take 0.5 tablets (12.5 mg total) by mouth 2 (two) times daily. 180 tablet 3   pantoprazole (PROTONIX) 40 MG tablet Take 1 tablet (40 mg total) by mouth 2 (two) times daily. 90 tablet 3   rosuvastatin (CRESTOR) 20 MG tablet Take 1 tablet (20 mg total) by mouth daily. 90 tablet 3   sildenafil (REVATIO) 20 MG tablet TAKE 3 TO 5 TABLETS BY MOUTH AS NEEDED PRIOR TO SEXUAL INTERCOURSE; DO NOT TAKE MORE THAN 1 DOSE IN 24 HOURS ! 90 tablet 0   traZODone (DESYREL) 50 MG tablet Take 0.5-1 tablets (25-50 mg total) by mouth at bedtime as needed for sleep. 90 tablet 0   No current facility-administered medications for this visit.     Review of Systems    He denies dyspnea, pnd, orthopnea, n, v, dizziness, syncope, edema, weight gain, or early satiety. All other systems reviewed and are otherwise negative except as noted above.   Physical Exam    VS:  BP (!) 130/90   Pulse 89   Ht 6' (1.829 m)   Wt 262 lb 6.4 oz (119 kg)    SpO2 97%   BMI 35.59 kg/m  GEN: Well nourished, well developed, in no acute distress. HEENT: normal. Neck: Supple, no JVD, carotid bruits, or masses. Cardiac: RRR, no murmurs, rubs, or gallops. No clubbing, cyanosis, edema.  Radials/DP/PT 2+ and equal bilaterally.  Respiratory:  Respirations regular and unlabored, clear to auscultation bilaterally. GI: Soft, nontender, nondistended, BS + x 4. MS: no deformity or atrophy. Skin: warm and dry, no rash. Neuro:  Strength and sensation are intact. Psych: Normal affect.  Accessory Clinical Findings    ECG personally reviewed by me today - No EKG in office today.   Lab Results  Component Value Date   WBC 8.7 03/29/2022  HGB 17.6 (H) 03/29/2022   HCT 51.2 03/29/2022   MCV 88.8 03/29/2022   PLT 267.0 03/29/2022   Lab Results  Component Value Date   CREATININE 1.10 03/29/2022   BUN 13 03/29/2022   NA 138 03/29/2022   K 4.5 03/29/2022   CL 100 03/29/2022   CO2 28 03/29/2022   Lab Results  Component Value Date   ALT 47 03/29/2022   AST 25 03/29/2022   ALKPHOS 82 03/29/2022   BILITOT 1.4 (H) 03/29/2022   Lab Results  Component Value Date   CHOL 226 (H) 03/29/2022   HDL 43.50 03/29/2022   LDLCALC 45 05/13/2020   LDLDIRECT 130.0 03/29/2022   TRIG 296.0 (H) 03/29/2022   CHOLHDL 5 03/29/2022    Lab Results  Component Value Date   HGBA1C 6.7 (A) 03/29/2022    Assessment & Plan    1. Palpitations/PSVT: Preliminary results from recent monitor revealed predominantly sinus rhythm, 1 run of NSVT, 1684 runs of SVT, fastest interval lasting 13 seconds with a max heart rate of 218 bpm, longest lasting 2 minutes and 12 seconds with an average heart rate of 139 bpm.  SVT was detected within +/- 45 seconds of symptomatic patient events were PACs and PVCs.  Discussed monitor results.  Patient does report a significant mount of personal stress, mostly related to his work.  Additionally, he takes methylphenidate daily for his ADHD, which is  possibly contributing to his symptoms. Will start metoprolol 12.5 twice daily, will likely need to titrate dose if he tolerates the medication.  Will check echo, coronary CTA as below.  Discussed ED precautions, triggers, vagal maneuvers, possible need for EP referral if he does not improve with beta-blocker therapy.   2. Chest pressure/fatigue: He has noted increase fatigue, intermittent chest pressure, often associated with palpitations, but not always. He denies exertional symptoms. Through shared decision making, will pursue coronary CT angiogram for further risk stratification and to rule out ischemia.   3. Hypertension: BP overall well controlled.  DBP is slightly elevated.  Will start metoprolol as above, otherwise, continue current antihypertensive regimen.   4. Hyperlipidemia: dLDL was 130 in 03/2022.  Pending CT results, consider escalation of statin therapy.  He is on Crestor and fenofibrate.  5. Type 2 diabetes/Obesity: A1c was 6.7 in 03/2022.Monitored and managed per PCP.  Encouraged ongoing lifestyle modifications with diet and exercise.  6. OSA: He has a history of sleep apnea, nonadherent to CPAP.  Encouraged full adherence.  7. Disposition: Follow-up in 6-8 weeks.   HYPERTENSION CONTROL Vitals:   06/21/22 0904 06/21/22 0949  BP: (!) 126/96 (!) 130/90    The patient's blood pressure is elevated above target today.  In order to address the patient's elevated BP: Blood pressure will be monitored at home to determine if medication changes need to be made.; A new medication was prescribed today.; Follow up with general cardiology has been recommended.      Lenna Sciara, NP 06/21/2022, 9:50 AM

## 2022-06-29 ENCOUNTER — Telehealth (HOSPITAL_COMMUNITY): Payer: Self-pay | Admitting: *Deleted

## 2022-06-29 NOTE — Telephone Encounter (Signed)
Attempted to call patient regarding upcoming cardiac CT appointment. °Left message on voicemail with name and callback number ° °Carlesha Seiple RN Navigator Cardiac Imaging °[facility] °[phone] Office °[phone] Cell ° °

## 2022-06-30 ENCOUNTER — Telehealth (HOSPITAL_COMMUNITY): Payer: Self-pay | Admitting: *Deleted

## 2022-06-30 NOTE — Telephone Encounter (Signed)
Reaching out to patient to offer assistance regarding upcoming cardiac imaging study; pt verbalizes understanding of appt date/time, parking situation and where to check in, pre-test NPO status and medications ordered, and verified current allergies; name and call back number provided for further questions should they arise  Michael Clement RN Navigator [facility] [phone] office [phone] cell  Patient aware to obtain labs.  He will hold his 25mg  metoprolol tartrate and take 100mg  metoprolol tartrate two hours prior to his cardiac CT scan.  He is aware to arrive at 9am.

## 2022-07-01 ENCOUNTER — Ambulatory Visit (HOSPITAL_BASED_OUTPATIENT_CLINIC_OR_DEPARTMENT_OTHER)
Admission: RE | Admit: 2022-07-01 | Discharge: 2022-07-01 | Disposition: A | Discharge status: Home / Self Care | Payer: BC Managed Care – PPO | Source: Ambulatory Visit | Attending: Cardiology | Admitting: Cardiology

## 2022-07-01 ENCOUNTER — Ambulatory Visit (HOSPITAL_COMMUNITY)
Admission: RE | Admit: 2022-07-01 | Discharge: 2022-07-01 | Disposition: A | Discharge status: Home / Self Care | Payer: BC Managed Care – PPO | Source: Ambulatory Visit | Attending: Nurse Practitioner | Admitting: Nurse Practitioner

## 2022-07-01 ENCOUNTER — Other Ambulatory Visit: Payer: Self-pay | Admitting: Cardiology

## 2022-07-01 DIAGNOSIS — R072 Precordial pain: Secondary | ICD-10-CM | POA: Diagnosis not present

## 2022-07-01 DIAGNOSIS — R931 Abnormal findings on diagnostic imaging of heart and coronary circulation: Secondary | ICD-10-CM | POA: Insufficient documentation

## 2022-07-01 DIAGNOSIS — I2510 Atherosclerotic heart disease of native coronary artery without angina pectoris: Secondary | ICD-10-CM | POA: Diagnosis not present

## 2022-07-01 DIAGNOSIS — I471 Supraventricular tachycardia, unspecified: Secondary | ICD-10-CM | POA: Insufficient documentation

## 2022-07-01 DIAGNOSIS — R002 Palpitations: Secondary | ICD-10-CM | POA: Insufficient documentation

## 2022-07-01 LAB — BASIC METABOLIC PANEL
BUN/Creatinine Ratio: 22 — ABNORMAL HIGH (ref 9–20)
BUN: 24 mg/dL (ref 6–24)
CO2: 19 mmol/L — ABNORMAL LOW (ref 20–29)
Calcium: 10.6 mg/dL — ABNORMAL HIGH (ref 8.7–10.2)
Chloride: 101 mmol/L (ref 96–106)
Creatinine, Ser: 1.11 mg/dL (ref 0.76–1.27)
Glucose: 156 mg/dL — ABNORMAL HIGH (ref 70–99)
Potassium: 4 mmol/L (ref 3.5–5.2)
Sodium: 140 mmol/L (ref 134–144)
eGFR: 81 mL/min/{1.73_m2} (ref >59)

## 2022-07-01 MED ORDER — NITROGLYCERIN 0.4 MG SL SUBL
0.8000 mg | SUBLINGUAL_TABLET | Freq: Once | SUBLINGUAL | Status: AC
  Administered 2022-07-01: 0.8 mg via SUBLINGUAL

## 2022-07-01 MED ORDER — DILTIAZEM HCL 25 MG/5ML IV SOLN: 5.0000 mg | INTRAVENOUS | Status: DC | PRN

## 2022-07-01 MED ORDER — NITROGLYCERIN 0.4 MG SL SUBL
SUBLINGUAL_TABLET | SUBLINGUAL | Status: AC
  Filled 2022-07-01: qty 2

## 2022-07-01 MED ORDER — IOHEXOL 350 MG/ML SOLN
100.0000 mL | Freq: Once | INTRAVENOUS | Status: AC | PRN
  Administered 2022-07-01: 100 mL via INTRAVENOUS

## 2022-07-01 MED ORDER — METOPROLOL TARTRATE 5 MG/5ML IV SOLN
5.0000 mg | INTRAVENOUS | Status: DC | PRN
  Administered 2022-07-01 (×2): 5 mg via INTRAVENOUS

## 2022-07-10 ENCOUNTER — Encounter: Payer: Self-pay | Admitting: Family Medicine

## 2022-07-19 ENCOUNTER — Ambulatory Visit (HOSPITAL_COMMUNITY): Payer: BC Managed Care – PPO | Attending: Cardiology

## 2022-07-19 DIAGNOSIS — R072 Precordial pain: Secondary | ICD-10-CM | POA: Diagnosis not present

## 2022-07-19 DIAGNOSIS — R002 Palpitations: Secondary | ICD-10-CM | POA: Insufficient documentation

## 2022-07-19 DIAGNOSIS — I471 Supraventricular tachycardia, unspecified: Secondary | ICD-10-CM

## 2022-07-19 LAB — ECHOCARDIOGRAM COMPLETE
Area-P 1/2: 3.5 cm2
S' Lateral: 3.7 cm

## 2022-07-20 NOTE — Progress Notes (Signed)
Has upcoming visit with Bernadene Person 08/01/22 to discuss in more detail

## 2022-08-01 ENCOUNTER — Encounter: Payer: Self-pay | Admitting: Nurse Practitioner

## 2022-08-01 ENCOUNTER — Ambulatory Visit: Payer: BC Managed Care – PPO | Attending: Nurse Practitioner | Admitting: Nurse Practitioner

## 2022-08-01 VITALS — BP 118/98 | HR 116 | Ht 73.0 in | Wt 259.0 lb

## 2022-08-01 DIAGNOSIS — I10 Essential (primary) hypertension: Secondary | ICD-10-CM | POA: Diagnosis not present

## 2022-08-01 DIAGNOSIS — G4733 Obstructive sleep apnea (adult) (pediatric): Secondary | ICD-10-CM

## 2022-08-01 DIAGNOSIS — R002 Palpitations: Secondary | ICD-10-CM

## 2022-08-01 DIAGNOSIS — I471 Supraventricular tachycardia, unspecified: Secondary | ICD-10-CM | POA: Diagnosis not present

## 2022-08-01 DIAGNOSIS — R072 Precordial pain: Secondary | ICD-10-CM | POA: Diagnosis not present

## 2022-08-01 DIAGNOSIS — Z6836 Body mass index (BMI) 36.0-36.9, adult: Secondary | ICD-10-CM

## 2022-08-01 DIAGNOSIS — E1165 Type 2 diabetes mellitus with hyperglycemia: Secondary | ICD-10-CM

## 2022-08-01 DIAGNOSIS — E785 Hyperlipidemia, unspecified: Secondary | ICD-10-CM

## 2022-08-01 MED ORDER — METOPROLOL TARTRATE 37.5 MG PO TABS: 37.5000 mg | ORAL_TABLET | Freq: Two times a day (BID) | ORAL | 3 refills | Status: DC

## 2022-08-01 MED ORDER — OLMESARTAN MEDOXOMIL 40 MG PO TABS: 40.0000 mg | ORAL_TABLET | Freq: Every day | ORAL | 3 refills | Status: DC

## 2022-08-01 NOTE — Progress Notes (Signed)
Office Visit    Patient Name: Michael Jacobs Date of Encounter: 08/01/2022  Primary Care Provider:  Karie Georges, MD Primary Cardiologist:  Jodelle Red, MD  Chief Complaint    52 year old male with a history of CAD, palpitations, PSVT, hypertension, hyperlipidemia, type 2 diabetes, asthma, anxiety,  OSA, ADHD, and GERD who presents for follow-up related to CAD and palpitations.   Past Medical History    Past Medical History:  Diagnosis Date   Anxiety    Asthma    Cecal ulcer    Diabetes mellitus 2017   GERD (gastroesophageal reflux disease)    Hiatal hernia    Hypercholesterolemia    Hypertension    Past Surgical History:  Procedure Laterality Date   COLONOSCOPY  2015   EYE SURGERY     04/2020- Retina repair bilateral eyes by Dr. Ashley Royalty per patient    lasik Bilateral    POLYPECTOMY     TONSILLECTOMY AND ADENOIDECTOMY     UPPER GASTROINTESTINAL ENDOSCOPY     WISDOM TOOTH EXTRACTION      Allergies  Allergies  Allergen Reactions   Ace Inhibitors Cough   Phenergan [Promethazine Hcl] Anxiety     Labs/Other Studies Reviewed    The following studies were reviewed today: ZIO 06-09-2022 (preliminary result):   Patch Wear Time:  13 days and 21 hours (2024-01-19T12:03:44-0500 to 2024-02-02T09:13:21-0500)   [name], [name], [name]. Predominant underlying rhythm was Sinus Rhythm. Slight P wave morphology changes were noted. 1 run of Ventricular Tachycardia occurred lasting 4 beats with a max rate of 133 bpm  (avg 128 bpm). 1684 Supraventricular Tachycardia runs occurred, the run with the fastest interval lasting 13.0 secs with a max rate of 218 bpm, the longest lasting 2 mins 12 secs with an avg rate of 139 bpm. Some episodes of Supraventricular Tachycardia  may be possible Atrial Tachycardia with variable block. Supraventricular Tachycardia was detected within +/- 45 seconds of symptomatic patient event(s).  Isolated SVEs were occasional (1.7%, 32150), SVE Couplets were rare (<1.0%, 3954), and SVE Triplets  were rare (<1.0%, 1263). Isolated VEs were rare (<1.0%), and no VE Couplets or VE Triplets were present. Difficulty discerning atrial activity making definitive diagnosis difficult to ascertain.   CCTA c/ FFR 06/2022:   IMPRESSION: 1. Coronary calcium score of 71. This was 55 percentile for age and sex matched control.   2. Normal coronary origin with right dominance.   3. CAD-RADS 2. Mild non-obstructive CAD (25-49%). Consider non-atherosclerotic causes of chest pain. Consider preventive therapy and risk factor modification. Additional analysis with CT FFR will be submitted for soft plaque in the distal LCX.   The noncardiac portion of this study will be interpreted in separate report by the radiologist.    FINDINGS: FFRct analysis was performed on the original cardiac CT angiogram dataset. Diagrammatic representation of the FFRct analysis is provided in a separate PDF document in PACS. This dictation was created using the PDF document and an interactive 3D model of the results. 3D model is not available in the EMR/PACS. Normal FFR range is >0.80. Indeterminate (grey) zone is 0.76-0.80.   1. Left Main: FFR = 1.00   2. LAD: Proximal FFR = 0.98, mid FFR = 0.96, distal FFR = 0.90 3. LCX: Proximal FFR = 0.97, distal FFR = 0.82 4. RCA: Proximal FFR = 0.97, mid and distal FFR not analyzed.   IMPRESSION: 1.  CT FFR analysis showed no significant stenosis.  Echo 07/2022: IMPRESSIONS     1. Left ventricular ejection fraction, by estimation, is 60 to 65%. The  left ventricle has normal function. The left ventricle has no regional  wall motion abnormalities. Left ventricular diastolic parameters were  normal.   2. Right ventricular systolic function is normal. The right ventricular  size is normal. Tricuspid regurgitation signal is inadequate for assessing  PA pressure.   3. The  mitral valve is normal in structure. Trivial mitral valve  regurgitation. No evidence of mitral stenosis.   4. The aortic valve is tricuspid. Aortic valve regurgitation is not  visualized. No aortic stenosis is present.   5. Aortic dilatation noted. There is borderline dilatation of the  ascending aorta, measuring 38 mm.   Comparison(s): No prior Echocardiogram.   Conclusion(s)/Recommendation(s): Otherwise normal echocardiogram, with  minor abnormalities described in the report.  Recent Labs: 03/29/2022: ALT 47; Hemoglobin 17.6; Platelets 267.0; TSH 3.94 06/30/2022: BUN 24; Creatinine, Ser 1.11; Potassium 4.0; Sodium 140  Recent Lipid Panel    Component Value Date/Time   CHOL 226 (H) 03/29/2022 0858   TRIG 296.0 (H) 03/29/2022 0858   HDL 43.50 03/29/2022 0858   CHOLHDL 5 03/29/2022 0858   VLDL 59.2 (H) 03/29/2022 0858   LDLCALC 45 05/13/2020 0929   LDLCALC 105 (H) 11/05/2019 0916   LDLDIRECT 130.0 03/29/2022 0858    History of Present Illness    52 year old male with the above past medical history including CAD, palpitations, PSVT, hypertension, hyperlipidemia, type 2 diabetes, asthma, anxiety, OSA, ADHD and GERD.   He was referred to Dr. Cristal Deer in 04/2022 in the setting of palpitations.  14-day ZIO revealed predominantly sinus rhythm, 1 run of NSVT, 1684 runs of SVT, fastest interval lasting 13 seconds with a max heart rate of 218 bpm, longest lasting 2 minutes and 12 seconds with an average heart rate of 139 bpm.  SVT was detected within +/- 45 seconds of symptomatic patient events were PACs and PVCs.  He was last seen in the office on 06/21/2022 and noted daily palpitations, intermittent chest pressure, increased fatigue.  Echocardiogram was essentially normal.  Coronary CT angiogram revealed coronary calcium score of 71 (87 percentile), soft plaque in the distal left circumflex, negative FFR.  He was started on metoprolol and Crestor.  He presents today for follow-up. Since his  last visit he has stable overall from a cardiac standpoint.  His BP and HR are elevated today.  He notes that prior to his visit today he took sildenafil, which tends to raise his blood pressure and heart rate.  He has noted some improvement in palpitations with increased metoprolol, though as his medication wears off he begins to notice some palpitations.  He notes stable mild dyspnea on exertion, this has been more noticeable since he had COVID-19.  He denies chest pain.  Overall, he reports feeling well.  Home Medications    Current Outpatient Medications  Medication Sig Dispense Refill   albuterol (VENTOLIN HFA) 108 (90 Base) MCG/ACT inhaler Inhale 2 puffs into the lungs every 6 (six) hours as needed. 8.5 g 2   ALPRAZolam (XANAX) 0.5 MG tablet Take 0.5 mg by mouth as needed for anxiety or sleep.     amLODipine (NORVASC) 10 MG tablet Take 1 tablet (10 mg total) by mouth daily. 90 tablet 3   chlorthalidone (HYGROTON) 25 MG tablet Take 1 tablet (25 mg total) by mouth daily. 90 tablet 3   clonazePAM (  KLONOPIN) 1 MG tablet Take 1 mg by mouth as needed for anxiety (sleep).     empagliflozin (JARDIANCE) 10 MG TABS tablet TAKE ONE TABLET BY MOUTH DAILY BEFORE BREAKFAST 90 tablet 3   fenofibrate 54 MG tablet Take 1 tablet (54 mg total) by mouth daily. 90 tablet 3   JORNAY PM 20 MG CP24 Take 1 capsule by mouth at bedtime.     metFORMIN (GLUCOPHAGE) 1000 MG tablet TAKE 1 TABLET BY MOUTH TWICE A DAY WITH A MEAL; **MUST CALL MD FOR APPOINTMENT FOR FURTHER REFILLS 180 tablet 1   olmesartan (BENICAR) 40 MG tablet Take 1 tablet (40 mg total) by mouth daily. 90 tablet 3   pantoprazole (PROTONIX) 40 MG tablet Take 1 tablet (40 mg total) by mouth 2 (two) times daily. 90 tablet 3   rosuvastatin (CRESTOR) 20 MG tablet Take 1 tablet (20 mg total) by mouth daily. 90 tablet 3   sildenafil (REVATIO) 20 MG tablet TAKE 3 TO 5 TABLETS BY MOUTH AS NEEDED PRIOR TO SEXUAL INTERCOURSE; DO NOT TAKE MORE THAN 1 DOSE IN 24 HOURS  ! 90 tablet 0   metoprolol tartrate 37.5 MG TABS Take 1 tablet (37.5 mg total) by mouth 2 (two) times daily. 180 tablet 3   traZODone (DESYREL) 50 MG tablet Take 0.5-1 tablets (25-50 mg total) by mouth at bedtime as needed for sleep. (Patient not taking: Reported on 08/01/2022) 90 tablet 0   No current facility-administered medications for this visit.     Review of Systems    He denies chest pain, pnd, orthopnea, n, v, dizziness, syncope, edema, weight gain, or early satiety. All other systems reviewed and are otherwise negative except as noted above.   Physical Exam    VS:  BP (!) 118/98   Pulse (!) 116   Ht  (1.854 m)   Wt 259 lb (117.5 kg)   SpO2 94%   BMI 34.17 kg/m  GEN: Well nourished, well developed, in no acute distress. HEENT: normal. Neck: Supple, no JVD, carotid bruits, or masses. Cardiac: RRR, no murmurs, rubs, or gallops. No clubbing, cyanosis, edema.  Radials/DP/PT 2+ and equal bilaterally.  Respiratory:  Respirations regular and unlabored, clear to auscultation bilaterally. GI: Soft, nontender, nondistended, BS + x 4. MS: no deformity or atrophy. Skin: warm and dry, no rash. Neuro:  Strength and sensation are intact. Psych: Normal affect.  Accessory Clinical Findings    ECG personally reviewed by me today -no EKG in office today (patient declined).   Lab Results  Component Value Date   WBC 8.7 03/29/2022   HGB 17.6 (H) 03/29/2022   HCT 51.2 03/29/2022   MCV 88.8 03/29/2022   PLT 267.0 03/29/2022   Lab Results  Component Value Date   CREATININE 1.11 06/30/2022   BUN 24 06/30/2022   NA 140 06/30/2022   K 4.0 06/30/2022   CL 101 06/30/2022   CO2 19 (L) 06/30/2022   Lab Results  Component Value Date   ALT 47 03/29/2022   AST 25 03/29/2022   ALKPHOS 82 03/29/2022   BILITOT 1.4 (H) 03/29/2022   Lab Results  Component Value Date   CHOL 226 (H) 03/29/2022   HDL 43.50 03/29/2022   LDLCALC 45 05/13/2020   LDLDIRECT 130.0 03/29/2022   TRIG  296.0 (H) 03/29/2022   CHOLHDL 5 03/29/2022    Lab Results  Component Value Date   HGBA1C 6.7 (A) 03/29/2022    Assessment & Plan    1. Palpitations/PSVT: Monitor in  04/2022 revealed predominantly sinus rhythm, 1 run of NSVT, 1684 runs of SVT, fastest interval lasting 13 seconds with a max heart rate of 218 bpm, longest lasting 2 minutes and 12 seconds with an average heart rate of 139 bpm.  SVT was detected within +/- 45 seconds of symptomatic patient events were PACs and PVCs.  Echocardiogram was essentially normal. He takes methylphenidate daily for his ADHD, which is possibly contributing to his symptoms.  He was started on metoprolol.  He continues to note palpitations later in the day as his metoprolol begins to wear off.  Will increase metoprolol to 37.5 mg twice daily.  Reinforced ED precautions, triggers, vagal maneuvers. Consider possible EP referral if he continues to have significant symptoms despite beta-blocker therapy.  2. Chest pressure/fatigue/CAD:  Coronary CT angiogram in 06/2022 revealed coronary calcium score of 71 (87 percentile), soft plaque in the distal left circumflex, negative FFR.  He notes some generalized fatigue, mild dyspnea on exertion, this has been noticeable since he had COVID-19.  Denies any other symptoms concerning for angina.  Continue metoprolol, olmesartan as below, chlorthalidone, amlodipine, and Crestor.  3. Hypertension: BP remains elevated above goal.  Will stop losartan and start olmesartan 40 mg daily.  Will increase metoprolol as above.  Continue to monitor BP and report BP consider than 130/80.  Otherwise, continue current antihypertensive regimen.   4. Hyperlipidemia: dLDL was 130 in 03/2022.  He recently started taking Crestor.  He will have repeat fasting lipids, CMET drawn with his PCP in the coming weeks.  If LDL remains elevated above goal, consider escalation of statin therapy.   5. Type 2 diabetes/Obesity: A1c was 6.7 in 03/2022. Monitored and  managed per PCP.  Encouraged ongoing lifestyle modifications with diet and exercise.  Potentially a good candidate for GLP-1 agonist.   6. OSA: Reports adherence to CPAP.   7. Disposition: Follow-up in 6 to 8 weeks.  HYPERTENSION CONTROL Vitals:   08/01/22 1025 08/01/22 1045  BP: (!) 122/102 (!) 118/98    The patient's blood pressure is elevated above target today.  In order to address the patient's elevated BP: Blood pressure will be monitored at home to determine if medication changes need to be made.; A current anti-hypertensive medication was adjusted today.; A new medication was prescribed today.; Follow up with general cardiology has been recommended.      Joylene Grapes, NP 08/01/2022, 12:04 PM

## 2022-08-01 NOTE — Patient Instructions (Addendum)
Medication Instructions:  Stop Losartan as directed. Start Olmesartan 40 mg daily. Increase Metoprolol 37.5 mg twice daily.  *If you need a refill on your cardiac medications before your next appointment, please call your pharmacy*   Lab Work: NONE ordered at this time of appointment   If you have labs (blood work) drawn today and your tests are completely normal, you will receive your results only by: MyChart Message (if you have MyChart) OR A paper copy in the mail If you have any lab test that is abnormal or we need to change your treatment, we will call you to review the results.   Testing/Procedures: NONE ordered at this time of appointment     Follow-Up: At [facility], you and your health needs are our priority.  As part of our continuing mission to provide you with exceptional heart care, we have created designated Provider Care Teams.  These Care Teams include your primary Cardiologist (physician) and Advanced Practice Providers (APPs -  Physician Assistants and Nurse Practitioners) who all work together to provide you with the care you need, when you need it.  We recommend signing up for the patient portal called "MyChart".  Sign up information is provided on this After Visit Summary.  MyChart is used to connect with patients for Virtual Visits (Telemedicine).  Patients are able to view lab/test results, encounter notes, upcoming appointments, etc.  Non-urgent messages can be sent to your provider as well.   To learn more about what you can do with MyChart, go to [email or web address].    Your next appointment:   6-8 week(s)  Provider:   Bernadene Person, NP        Other Instructions Goal Blood Pressure is 130/80. Please have lab work completed when you see your PCP (Fasting Lipid panel & CMET).

## 2022-08-22 ENCOUNTER — Telehealth: Payer: Self-pay | Admitting: Family Medicine

## 2022-08-22 NOTE — Telephone Encounter (Signed)
Pt call and stated he want you to give him a call back about some medication . 

## 2022-08-23 NOTE — Telephone Encounter (Signed)
Unable to leave a message due to voicemail being full. 

## 2022-08-30 NOTE — Telephone Encounter (Signed)
Left a message for the patient to return my call.  

## 2022-09-20 ENCOUNTER — Ambulatory Visit: Payer: BC Managed Care – PPO | Admitting: Nurse Practitioner

## 2022-09-28 ENCOUNTER — Ambulatory Visit (INDEPENDENT_AMBULATORY_CARE_PROVIDER_SITE_OTHER): Payer: BC Managed Care – PPO | Admitting: Family Medicine

## 2022-09-28 ENCOUNTER — Other Ambulatory Visit: Payer: Self-pay | Admitting: Family Medicine

## 2022-09-28 VITALS — BP 128/74 | HR 74 | Temp 98.0°F | Resp 16 | Ht 73.0 in | Wt 257.8 lb

## 2022-09-28 DIAGNOSIS — Z7985 Long-term (current) use of injectable non-insulin antidiabetic drugs: Secondary | ICD-10-CM | POA: Diagnosis not present

## 2022-09-28 DIAGNOSIS — E1165 Type 2 diabetes mellitus with hyperglycemia: Secondary | ICD-10-CM | POA: Diagnosis not present

## 2022-09-28 DIAGNOSIS — Z7984 Long term (current) use of oral hypoglycemic drugs: Secondary | ICD-10-CM

## 2022-09-28 DIAGNOSIS — N529 Male erectile dysfunction, unspecified: Secondary | ICD-10-CM

## 2022-09-28 LAB — POCT GLYCOSYLATED HEMOGLOBIN (HGB A1C): Hemoglobin A1C: 7.2 % — AB (ref 4.0–5.6)

## 2022-09-28 MED ORDER — SEMAGLUTIDE(0.25 OR 0.5MG/DOS) 2 MG/3ML ~~LOC~~ SOPN
0.5000 mg | PEN_INJECTOR | SUBCUTANEOUS | 2 refills | Status: DC
Start: 2022-09-28 — End: 2022-11-15

## 2022-09-28 MED ORDER — SEMAGLUTIDE(0.25 OR 0.5MG/DOS) 2 MG/3ML ~~LOC~~ SOPN
0.2500 mg | PEN_INJECTOR | SUBCUTANEOUS | 0 refills | Status: DC
Start: 2022-09-28 — End: 2022-11-15

## 2022-09-28 NOTE — Assessment & Plan Note (Signed)
A1C actually increased to 7.2 today, higher than last visit. Pt already on Jardiance and metformin, will add Ozempic 0.25 mg weekly, then increase to 0.5 mg weekly after 1 month. Will see him back in 3 months and check his labs at that time.

## 2022-09-28 NOTE — Progress Notes (Signed)
Established Patient Office Visit  Subjective   Patient ID: Michael Jacobs, male    DOB: February 17, 1971  Age: 52 y.o. MRN: [id number]  Chief Complaint  Patient presents with   Follow-up    Follow up    Patient is here for follow up today. He reports that he is doing well with his diet and exercise, however he has been doing keto/mediterranean was successful with this, however he is plateaued and hasn't lost any more weight. He states that he is interested in starting ozempic 0.25 mg and we discussed the risks/benefits of the medications, we reviewed potential side effects. A1C today is 7.2, higher than previous. Pt states he is having trouble staying on the low carb diet. No changes in exercise patterns.     Current Outpatient Medications  Medication Instructions   albuterol (VENTOLIN HFA) 108 (90 Base) MCG/ACT inhaler 2 puffs, Inhalation, Every 6 hours PRN   ALPRAZolam (XANAX) 0.5 mg, Oral, As needed   amLODipine (NORVASC) 10 mg, Oral, Daily   chlorthalidone (HYGROTON) 25 mg, Oral, Daily   clonazePAM (KLONOPIN) 1 mg, Oral, As needed   empagliflozin (JARDIANCE) 10 MG TABS tablet TAKE ONE TABLET BY MOUTH DAILY BEFORE BREAKFAST   fenofibrate 54 mg, Oral, Daily   JORNAY PM 20 MG CP24 1 capsule, Oral, Daily at bedtime   metFORMIN (GLUCOPHAGE) 1000 MG tablet TAKE 1 TABLET BY MOUTH TWICE A DAY WITH A MEAL; **MUST CALL MD FOR APPOINTMENT FOR FURTHER REFILLS   Metoprolol Tartrate 37.5 mg, Oral, 2 times daily   olmesartan (BENICAR) 40 mg, Oral, Daily   pantoprazole (PROTONIX) 40 mg, Oral, 2 times daily   rosuvastatin (CRESTOR) 20 mg, Oral, Daily   Semaglutide(0.25 or 0.5MG /DOS) 0.25 mg, Subcutaneous, Weekly   Semaglutide(0.25 or 0.5MG /DOS) 0.5 mg, Subcutaneous, Weekly   sildenafil (REVATIO) 20 MG tablet TAKE 3 TO 5 TABLETS BY MOUTH AS NEEDED PRIOR TO SEXUAL INTERCOURSE; DO NOT TAKE MORE THAN 1 DOSE IN 24 HOURS !   traZODone (DESYREL) 25-50 mg, Oral, At bedtime PRN    Patient Active Problem  List   Diagnosis Date Noted   ADD (attention deficit disorder) 08/20/2021   Pulmonary nodule 07/25/2014   Abnormal finding on CT scan 07/25/2014   Family history of colonic polyps 02/06/2014   Dysphagia, pharyngoesophageal phase 02/06/2014   Diabetes mellitus, type II (HCC) 07/20/2012   Hypertension 03/07/2012   Hyperlipidemia 03/07/2012   GERD (gastroesophageal reflux disease) 03/07/2012   Adjustment disorder with anxiety 03/07/2012      Review of Systems  All other systems reviewed and are negative.     Objective:     BP 128/74 (BP Location: Left Arm, Patient Position: Sitting, Cuff Size: Normal)   Pulse 74   Temp 98 F (36.7 C) (Oral)   Resp 16   Ht 6\' 1"  (1.854 m)   Wt 257 lb 12.8 oz (116.9 kg)   SpO2 97%   BMI 34.01 kg/m    Physical Exam Vitals reviewed.  Constitutional:      Appearance: Normal appearance. He is well-groomed. He is obese.  Eyes:     Conjunctiva/sclera: Conjunctivae normal.  Cardiovascular:     Rate and Rhythm: Normal rate and regular rhythm.     Heart sounds: S1 normal and S2 normal. No murmur heard. Pulmonary:     Effort: Pulmonary effort is normal.     Breath sounds: Normal breath sounds and air entry. No rales.  Abdominal:     General: Bowel sounds are normal.  Musculoskeletal:     Right lower leg: No edema.     Left lower leg: No edema.  Neurological:     General: No focal deficit present.     Mental Status: He is alert and oriented to person, place, and time.     Gait: Gait is intact.  Psychiatric:        Mood and Affect: Mood and affect normal.        Behavior: Behavior normal.      Results for orders placed or performed in visit on 09/28/22  POC HgB A1c  Result Value Ref Range   Hemoglobin A1C 7.2 (A) 4.0 - 5.6 %   HbA1c POC (<> result, manual entry)     HbA1c, POC (prediabetic range)     HbA1c, POC (controlled diabetic range)      Last metabolic panel Lab Results  Component Value Date   GLUCOSE 156 (H) 06/30/2022    NA 140 06/30/2022   K 4.0 06/30/2022   CL 101 06/30/2022   CO2 19 (L) 06/30/2022   BUN 24 06/30/2022   CREATININE 1.11 06/30/2022   EGFR 81 06/30/2022   CALCIUM 10.6 (H) 06/30/2022   PROT 7.5 03/29/2022   ALBUMIN 4.6 03/29/2022   BILITOT 1.4 (H) 03/29/2022   ALKPHOS 82 03/29/2022   AST 25 03/29/2022   ALT 47 03/29/2022   Last lipids Lab Results  Component Value Date   CHOL 226 (H) 03/29/2022   HDL 43.50 03/29/2022   LDLCALC 45 05/13/2020   LDLDIRECT 130.0 03/29/2022   TRIG 296.0 (H) 03/29/2022   CHOLHDL 5 03/29/2022      The 16-XWRU ASCVD risk score (Arnett DK, et al., 2019) is: 10.5%    Assessment & Plan:  Type 2 diabetes mellitus with hyperglycemia, without long-term current use of insulin (HCC) Assessment & Plan: A1C actually increased to 7.2 today, higher than last visit. Pt already on Jardiance and metformin, will add Ozempic 0.25 mg weekly, then increase to 0.5 mg weekly after 1 month. Will see him back in 3 months and check his labs at that time.  Orders: -     POCT glycosylated hemoglobin (Hb A1C) -     Semaglutide(0.25 or 0.5MG /DOS); Inject 0.25 mg into the skin once a week.  Dispense: 3 mL; Refill: 0 -     Semaglutide(0.25 or 0.5MG /DOS); Inject 0.5 mg into the skin once a week.  Dispense: 3 mL; Refill: 2 -     Comprehensive metabolic panel; Future -     Lipid panel; Future -     Hemoglobin A1c; Future -     Microalbumin / creatinine urine ratio; Future     Return in about 3 months (around 12/29/2022) for DM.    Karie Georges, MD

## 2022-09-29 ENCOUNTER — Encounter: Payer: Self-pay | Admitting: Family Medicine

## 2022-09-29 MED ORDER — SILDENAFIL CITRATE 20 MG PO TABS
ORAL_TABLET | ORAL | 0 refills | Status: DC
Start: 2022-09-29 — End: 2023-02-27

## 2022-09-29 NOTE — Telephone Encounter (Signed)
They might still be working on the prior auth-- can you check to see if it was approved?

## 2022-09-29 NOTE — Telephone Encounter (Signed)
Ok to refill the revatio -- looks like a different provider wrote alprazolam for him in January, please notify patient that he must ask for refills from the other provider, ok to refuse prescription

## 2022-09-30 NOTE — Telephone Encounter (Signed)
PA intinitated and sent to plan. Currently awaiting approval or denial from insurance.  (Key: [id number]) Patient aware via mychart message

## 2022-09-30 NOTE — Telephone Encounter (Signed)
PA intinitated and sent to plan. Currently awaiting approval or denial from insurance.  (Key: [id number])

## 2022-09-30 NOTE — Telephone Encounter (Signed)
Patient callin to check on progress of this request

## 2022-09-30 NOTE — Telephone Encounter (Signed)
(  Key: [id number]) Ozempic (0.25 or 0.5 MG/DOSE) 2MG /3ML pen-injectors  Sent to [employer].

## 2022-10-03 ENCOUNTER — Telehealth: Payer: Self-pay | Admitting: *Deleted

## 2022-10-03 NOTE — Telephone Encounter (Signed)
Karin Golden faxed a refill request for Clonazepam 0.5mg  tablet-#60-no sigs noted.  Message sent to PCP.

## 2022-10-03 NOTE — Telephone Encounter (Signed)
Spoke with Dewayne Hatch at [employer] and informed her of the denial as below.

## 2022-10-03 NOTE — Telephone Encounter (Signed)
I do not prescribe this medication for him-- it looks like a Dr. Artist Pais? I will not refill this for him

## 2022-10-04 ENCOUNTER — Telehealth: Payer: Self-pay

## 2022-10-04 NOTE — Telephone Encounter (Signed)
A user error has taken place: encounter opened in error, closed for administrative reasons.

## 2022-10-04 NOTE — Telephone Encounter (Signed)
Patient Advocate Encounter  Prior Authorization for Ozempic (0.25 or 0.5 MG/DOSE) 2MG /3ML pen-injectors has been approved through [employer].    Key: [id number]  Effective: 09-30-2022 to 09-30-2023

## 2022-10-05 NOTE — Telephone Encounter (Signed)
See prior note-Mykal received approval previously and stated he will contact the patient.

## 2022-10-12 NOTE — Telephone Encounter (Signed)
Patient Advocate Encounter  Prior Authorization for Ozempic (0.25 or 0.5 MG/DOSE) 2MG /3ML pen-injectors has been approved through [employer].    Key: [id number]  Effective: 10-03-2022 to 09-30-2023

## 2022-10-20 ENCOUNTER — Encounter: Payer: Self-pay | Admitting: Nurse Practitioner

## 2022-10-20 ENCOUNTER — Ambulatory Visit: Payer: BC Managed Care – PPO | Admitting: Nurse Practitioner

## 2022-10-20 VITALS — BP 136/88 | HR 75 | Ht 73.0 in | Wt 253.0 lb

## 2022-10-20 DIAGNOSIS — E785 Hyperlipidemia, unspecified: Secondary | ICD-10-CM

## 2022-10-20 DIAGNOSIS — R002 Palpitations: Secondary | ICD-10-CM

## 2022-10-20 DIAGNOSIS — E1165 Type 2 diabetes mellitus with hyperglycemia: Secondary | ICD-10-CM

## 2022-10-20 DIAGNOSIS — I2510 Atherosclerotic heart disease of native coronary artery without angina pectoris: Secondary | ICD-10-CM

## 2022-10-20 DIAGNOSIS — G4733 Obstructive sleep apnea (adult) (pediatric): Secondary | ICD-10-CM

## 2022-10-20 DIAGNOSIS — I10 Essential (primary) hypertension: Secondary | ICD-10-CM

## 2022-10-20 DIAGNOSIS — I471 Supraventricular tachycardia, unspecified: Secondary | ICD-10-CM | POA: Diagnosis not present

## 2022-10-20 DIAGNOSIS — Z7984 Long term (current) use of oral hypoglycemic drugs: Secondary | ICD-10-CM

## 2022-10-20 DIAGNOSIS — Z6836 Body mass index (BMI) 36.0-36.9, adult: Secondary | ICD-10-CM

## 2022-10-20 NOTE — Patient Instructions (Addendum)
Medication Instructions:  Your physician recommends that you continue on your current medications as directed. Please refer to the Current Medication list given to you today.  *If you need a refill on your cardiac medications before your next appointment, please call your pharmacy*   Lab Work: NONE ordered at this time of appointment     Testing/Procedures: NONE ordered at this time of appointment     Follow-Up: At [facility], you and your health needs are our priority.  As part of our continuing mission to provide you with exceptional heart care, we have created designated Provider Care Teams.  These Care Teams include your primary Cardiologist (physician) and Advanced Practice Providers (APPs -  Physician Assistants and Nurse Practitioners) who all work together to provide you with the care you need, when you need it.  We recommend signing up for the patient portal called "MyChart".  Sign up information is provided on this After Visit Summary.  MyChart is used to connect with patients for Virtual Visits (Telemedicine).  Patients are able to view lab/test results, encounter notes, upcoming appointments, etc.  Non-urgent messages can be sent to your provider as well.   To learn more about what you can do with MyChart, go to [email or web address].    Your next appointment:   6 month(s)  Provider:   Bernadene Person, NP

## 2022-10-20 NOTE — Progress Notes (Signed)
Office Visit    Patient Name: Michael Jacobs Date of Encounter: 10/20/2022  Primary Care Provider:  Karie Georges, MD Primary Cardiologist:  Jodelle Red, MD  Chief Complaint    52 year old male with a history of CAD, palpitations, PSVT, hypertension, hyperlipidemia, type 2 diabetes, asthma, anxiety,  OSA, ADHD, and GERD who presents for follow-up related to CAD and palpitations.   Past Medical History    Past Medical History:  Diagnosis Date   Anxiety    Asthma    Cecal ulcer    Diabetes mellitus (HCC) 2017   GERD (gastroesophageal reflux disease)    Hiatal hernia    Hypercholesterolemia    Hypertension    Past Surgical History:  Procedure Laterality Date   COLONOSCOPY  2015   EYE SURGERY     04/2020- Retina repair bilateral eyes by Dr. Ashley Royalty per patient    lasik Bilateral    POLYPECTOMY     TONSILLECTOMY AND ADENOIDECTOMY     UPPER GASTROINTESTINAL ENDOSCOPY     WISDOM TOOTH EXTRACTION      Allergies  Allergies  Allergen Reactions   Ace Inhibitors Cough   Phenergan [Promethazine Hcl] Anxiety     Labs/Other Studies Reviewed    The following studies were reviewed today:  Cardiac Studies & Procedures       ECHOCARDIOGRAM  ECHOCARDIOGRAM COMPLETE 07/19/2022  Narrative ECHOCARDIOGRAM REPORT    Patient Name:   Michael Jacobs Date of Exam: 07/19/2022 Medical Rec #:  [id number]        Height:       72.0 in Accession #:    [id number]       Weight:       262.4 lb Date of Birth:  21-Feb-1971         BSA:          2.391 m Patient Age:    51 years         BP:           130/90 mmHg Patient Gender: M                HR:           84 bpm. Exam Location:  [address]  Procedure: 2D Echo, 3D Echo, Cardiac Doppler and Color Doppler  Indications:    R00.2 Palpitations  History:        Patient has no prior history of Echocardiogram examinations. Arrythmias:PSVT, Signs/Symptoms:Chest Pain, Dizziness/Lightheadedness and Fatigue; Risk  Factors:Family History of Coronary Artery Disease, Hypertension, Diabetes, Dyslipidemia and Former Smoker.  Sonographer:    Farrel Conners RDCS Referring Phys: Burdett Pinzon C Froilan Mclean  IMPRESSIONS   1. Left ventricular ejection fraction, by estimation, is 60 to 65%. The left ventricle has normal function. The left ventricle has no regional wall motion abnormalities. Left ventricular diastolic parameters were normal. 2. Right ventricular systolic function is normal. The right ventricular size is normal. Tricuspid regurgitation signal is inadequate for assessing PA pressure. 3. The mitral valve is normal in structure. Trivial mitral valve regurgitation. No evidence of mitral stenosis. 4. The aortic valve is tricuspid. Aortic valve regurgitation is not visualized. No aortic stenosis is present. 5. Aortic dilatation noted. There is borderline dilatation of the ascending aorta, measuring 38 mm.  Comparison(s): No prior Echocardiogram.  Conclusion(s)/Recommendation(s): Otherwise normal echocardiogram, with minor abnormalities described in the report.  FINDINGS Left Ventricle: Left ventricular ejection fraction, by estimation, is 60 to 65%. The left ventricle has normal function. The left ventricle  has no regional wall motion abnormalities. The left ventricular internal cavity size was normal in size. There is no left ventricular hypertrophy. Left ventricular diastolic parameters were normal.  Right Ventricle: The right ventricular size is normal. Right vetricular wall thickness was not well visualized. Right ventricular systolic function is normal. Tricuspid regurgitation signal is inadequate for assessing PA pressure.  Left Atrium: Left atrial size was normal in size.  Right Atrium: Right atrial size was normal in size.  Pericardium: There is no evidence of pericardial effusion.  Mitral Valve: The mitral valve is normal in structure. Trivial mitral valve regurgitation. No evidence of mitral valve  stenosis.  Tricuspid Valve: The tricuspid valve is grossly normal. Tricuspid valve regurgitation is not demonstrated. No evidence of tricuspid stenosis.  Aortic Valve: The aortic valve is tricuspid. Aortic valve regurgitation is not visualized. No aortic stenosis is present.  Pulmonic Valve: The pulmonic valve was not well visualized. Pulmonic valve regurgitation is not visualized. No evidence of pulmonic stenosis.  Aorta: Aortic dilatation noted. There is borderline dilatation of the ascending aorta, measuring 38 mm.  Venous: The inferior vena cava was not well visualized.  IAS/Shunts: The atrial septum is grossly normal.   LEFT VENTRICLE PLAX 2D LVIDd:         4.95 cm   Diastology LVIDs:         3.70 cm   LV e' medial:    8.70 cm/s LV PW:         1.00 cm   LV E/e' medial:  5.9 LV IVS:        0.95 cm   LV e' lateral:   12.30 cm/s LVOT diam:     2.40 cm   LV E/e' lateral: 4.2 LV SV:         86 LV SV Index:   36 LVOT Area:     4.52 cm  3D Volume EF: 3D EF:        62 % LV EDV:       122 ml LV ESV:       46 ml LV SV:        76 ml  RIGHT VENTRICLE RV Basal diam:  3.60 cm RV S prime:     8.70 cm/s TAPSE (M-mode): 1.7 cm  LEFT ATRIUM             Index        RIGHT ATRIUM           Index LA diam:        3.80 cm 1.59 cm/m   RA Pressure: 3.00 mmHg LA Vol (A2C):   55.1 ml 23.04 ml/m  RA Area:     14.00 cm LA Vol (A4C):   47.6 ml 19.91 ml/m  RA Volume:   36.70 ml  15.35 ml/m LA Biplane Vol: 51.5 ml 21.54 ml/m AORTIC VALVE LVOT Vmax:   90.10 cm/s LVOT Vmean:  57.900 cm/s LVOT VTI:    0.190 m  AORTA Ao Root diam: 3.50 cm Ao Asc diam:  3.80 cm  MITRAL VALVE               TRICUSPID VALVE MV Area (PHT)  cm         Estimated RAP:  3.00 mmHg MV Decel Time: 217 msec MV E velocity: 51.65 cm/s  SHUNTS MV A velocity: 59.25 cm/s  Systemic VTI:  0.19 m MV E/A ratio:  0.87        Systemic Diam: 2.40 cm  Jodelle Red MD Electronically signed by Jodelle Red  MD Signature Date/Time: 07/19/2022/1:07:51 PM    Final    MONITORS  LONG TERM MONITOR (3-14 DAYS) 05/24/2022  Narrative Patch Wear Time:  13 days and 21 hours  [name], [name], [name]. Predominant underlying rhythm was Sinus Rhythm. 1 run of Ventricular Tachycardia occurred lasting 4 beats with a max rate of 133 bpm (avg 128 bpm). 1684 Supraventricular Tachycardia runs occurred, the run with the fastest interval lasting 13.0 secs with a max rate of 218 bpm, the longest lasting 2 mins 12 secs with an avg rate of 139 bpm. Isolated SVEs were occasional (1.7%), ventricular ectopy was rare (<1%). There were 44 triggered events, which were typically associated with SVT.   CT SCANS  CT CORONARY MORPH W/CTA COR W/SCORE 07/01/2022  Addendum 07/05/2022  4:22 AM ADDENDUM REPORT: 07/05/2022 04:19  EXAM: OVER-READ INTERPRETATION  CT CHEST  The following report is an over-read performed by radiologist Dr. Donetta Potts [facility] on 07/05/2022. This over-read does not include interpretation of cardiac or coronary anatomy or pathology. The cardiac interpretation by the cardiologist is attached.  COMPARISON:  11/02/2015  FINDINGS: The heart is normal in size and there is a trace pericardial effusion. Scattered coronary artery calcifications are noted. The aorta and pulmonary trunk are normal in caliber.  No mediastinal, hilar, or axillary lymphadenopathy. The visualized esophagus is within normal limits.  Mild atelectasis is noted bilaterally.  No effusion or pneumothorax.  Hepatic steatosis is noted. No acute abnormality in the upper abdomen.  Degenerative changes are present in the thoracic spine. No acute osseous abnormality.  IMPRESSION: 1. Coronary artery calcifications. 2. Hepatic steatosis.   Electronically Signed By: Thornell Sartorius M.D. On: 07/05/2022 04:19  Narrative CLINICAL DATA:  This is a 52 year old male  with anginal symptoms.  EXAM: Cardiac/Coronary  CTA  TECHNIQUE: The patient was scanned on a [employer].  FINDINGS: A 100 kV prospective scan was triggered in the descending thoracic aorta at 111 HU's. Axial non-contrast 3 mm slices were carried out through the heart. The data set was analyzed on a dedicated work station and scored using the Agatson method. Gantry rotation speed was 250 msecs and collimation was .6 mm. No beta blockade and 0.8 mg of sl NTG was given. The 3D data set was reconstructed in 5% intervals of the 67-82 % of the R-R cycle. Diastolic phases were analyzed on a dedicated work station using MPR, MIP and VRT modes. The patient received 80 cc of contrast.  Aorta: Normal size.  No calcifications.  No dissection.  Aortic Valve:  Trileaflet.  No calcifications.  Coronary Arteries:  Normal coronary origin.  Right dominance.  RCA is a large dominant artery that gives rise to PDA and PLA. There is no plaque.  Left main is a large artery that gives rise to LAD and LCX arteries.  LAD is a large vessel. Mild (25-49%) calcified plaque in the proximal LAD. The mid LAD with mild focal calcified plaque. The distal LAD with no plaques.  LCX is a non-dominant artery that gives rise to one large OM1 branch. There is a focal minimal (<24%) calcified plaque in the mid LCX. The distal part of the vessel appears to have a soft plaque is present unable to quantify as this plaques sits at bend in the vessel. The proximal portion of the vessel with no  plaques. OM1 vessel is a large vessel with no plaques.  Coronary Calcium Score:  Left main: 0  Left anterior descending artery: 59  Left circumflex artery: 12  Right coronary artery: 0  Total: 71  Percentile: 87  Other findings:  Normal pulmonary vein drainage into the left atrium.  Normal left atrial appendage without a thrombus.  Normal size of the pulmonary artery.  IMPRESSION: 1. Coronary  calcium score of 71. This was 58 percentile for age and sex matched control.  2. Normal coronary origin with right dominance.  3. CAD-RADS 2. Mild non-obstructive CAD (25-49%). Consider non-atherosclerotic causes of chest pain. Consider preventive therapy and risk factor modification. Additional analysis with CT FFR will be submitted for soft plaque in the distal LCX.  The noncardiac portion of this study will be interpreted in separate report by the radiologist.  Electronically Signed: By: Thomasene Ripple D.O. On: 07/01/2022 11:25         Recent Labs: 03/29/2022: ALT 47; Hemoglobin 17.6; Platelets 267.0; TSH 3.94 06/30/2022: BUN 24; Creatinine, Ser 1.11; Potassium 4.0; Sodium 140  Recent Lipid Panel    Component Value Date/Time   CHOL 226 (H) 03/29/2022 0858   TRIG 296.0 (H) 03/29/2022 0858   HDL 43.50 03/29/2022 0858   CHOLHDL 5 03/29/2022 0858   VLDL 59.2 (H) 03/29/2022 0858   LDLCALC 45 05/13/2020 0929   LDLCALC 105 (H) 11/05/2019 0916   LDLDIRECT 130.0 03/29/2022 0858    History of Present Illness    52 year old male with the above past medical history including CAD, palpitations, PSVT, hypertension, hyperlipidemia, type 2 diabetes, asthma, anxiety, OSA, ADHD and GERD.   He was referred to Dr. Cristal Deer in 04/2022 in the setting of palpitations.  14-day ZIO revealed predominantly sinus rhythm, 1 run of NSVT, 1684 runs of SVT, fastest interval lasting 13 seconds with a max heart rate of 218 bpm, longest lasting 2 minutes and 12 seconds with an average heart rate of 139 bpm.  SVT was detected within +/- 45 seconds of symptomatic patient events were PACs and PVCs.  Echocardiogram was essentially normal.  Coronary CT angiogram revealed coronary calcium score of 71 (87 percentile), soft plaque in the distal left circumflex, negative FFR.  He was started on metoprolol and Crestor. He was last seen in the office on 08/01/2022 and was stable overall from a cardiac standpoint.  He noted  stable palpitations, stable mild dyspnea on exertion.  BP and HR were elevated.  Metoprolol was increased to 37.5 mg twice daily.   He presents today for follow-up. Since his last visit he has done well from a cardiac standpoint.  He notes he has only been taking half of his prescribed dose of metoprolol.  He has continued to note some palpitations in the evening.  He is hopeful that this will improve with increased dosing.  BP has been stable.  He denies any symptoms concerning for angina.  He has started taking Ozempic and has lost 4 pounds since.  Overall, he reports feeling well.  Home Medications    Current Outpatient Medications  Medication Sig Dispense Refill   albuterol (VENTOLIN HFA) 108 (90 Base) MCG/ACT inhaler Inhale 2 puffs into the lungs every 6 (six) hours as needed. 8.5 g 2   ALPRAZolam (XANAX) 0.5 MG tablet Take 0.5 mg by mouth as needed for anxiety or sleep.     amLODipine (NORVASC) 10 MG tablet Take 1 tablet (10 mg total) by mouth daily. 90 tablet 3  chlorthalidone (HYGROTON) 25 MG tablet Take 1 tablet (25 mg total) by mouth daily. 90 tablet 3   clonazePAM (KLONOPIN) 1 MG tablet Take 1 mg by mouth as needed for anxiety (sleep).     empagliflozin (JARDIANCE) 10 MG TABS tablet TAKE ONE TABLET BY MOUTH DAILY BEFORE BREAKFAST 90 tablet 3   fenofibrate 54 MG tablet Take 1 tablet (54 mg total) by mouth daily. 90 tablet 3   JORNAY PM 20 MG CP24 Take 1 capsule by mouth at bedtime.     metFORMIN (GLUCOPHAGE) 1000 MG tablet TAKE 1 TABLET BY MOUTH TWICE A DAY WITH A MEAL; **MUST CALL MD FOR APPOINTMENT FOR FURTHER REFILLS 180 tablet 1   metoprolol tartrate 37.5 MG TABS Take 1 tablet (37.5 mg total) by mouth 2 (two) times daily. 180 tablet 3   olmesartan (BENICAR) 40 MG tablet Take 1 tablet (40 mg total) by mouth daily. 90 tablet 3   pantoprazole (PROTONIX) 40 MG tablet Take 1 tablet (40 mg total) by mouth 2 (two) times daily. 90 tablet 3   rosuvastatin (CRESTOR) 20 MG tablet Take 1  tablet (20 mg total) by mouth daily. 90 tablet 3   Semaglutide,0.25 or 0.5MG /DOS, 2 MG/3ML SOPN Inject 0.25 mg into the skin once a week. 3 mL 0   Semaglutide,0.25 or 0.5MG /DOS, 2 MG/3ML SOPN Inject 0.5 mg into the skin once a week. 3 mL 2   sildenafil (REVATIO) 20 MG tablet TAKE 3 TO 5 TABLETS BY MOUTH AS NEEDED PRIOR TO SEXUAL INTERCOURSE; DO NOT TAKE MORE THAN 1 DOSE IN 24 HOURS ! 90 tablet 0   traZODone (DESYREL) 50 MG tablet Take 0.5-1 tablets (25-50 mg total) by mouth at bedtime as needed for sleep. 90 tablet 0   No current facility-administered medications for this visit.     Review of Systems    He denies chest pain, dyspnea, pnd, orthopnea, n, v, dizziness, syncope, edema, weight gain, or early satiety. All other systems reviewed and are otherwise negative except as noted above.   Physical Exam    VS:  BP 136/88 (BP Location: Right Arm, Patient Position: Sitting, Cuff Size: Large)   Pulse 75   Ht 6\' 1"  (1.854 m)   Wt 253 lb (114.8 kg)   SpO2 97%   BMI 33.38 kg/m  GEN: Well nourished, well developed, in no acute distress. HEENT: normal. Neck: Supple, no JVD, carotid bruits, or masses. Cardiac: RRR, no murmurs, rubs, or gallops. No clubbing, cyanosis, edema.  Radials/DP/PT 2+ and equal bilaterally.  Respiratory:  Respirations regular and unlabored, clear to auscultation bilaterally. GI: Soft, nontender, nondistended, BS + x 4. MS: no deformity or atrophy. Skin: warm and dry, no rash. Neuro:  Strength and sensation are intact. Psych: Normal affect.  Accessory Clinical Findings    ECG personally reviewed by me today -    -no EKG in office today.   Lab Results  Component Value Date   WBC 8.7 03/29/2022   HGB 17.6 (H) 03/29/2022   HCT 51.2 03/29/2022   MCV 88.8 03/29/2022   PLT 267.0 03/29/2022   Lab Results  Component Value Date   CREATININE 1.11 06/30/2022   BUN 24 06/30/2022   NA 140 06/30/2022   K 4.0 06/30/2022   CL 101 06/30/2022   CO2 19 (L) 06/30/2022    Lab Results  Component Value Date   ALT 47 03/29/2022   AST 25 03/29/2022   ALKPHOS 82 03/29/2022   BILITOT 1.4 (H) 03/29/2022   Lab  Results  Component Value Date   CHOL 226 (H) 03/29/2022   HDL 43.50 03/29/2022   LDLCALC 45 05/13/2020   LDLDIRECT 130.0 03/29/2022   TRIG 296.0 (H) 03/29/2022   CHOLHDL 5 03/29/2022    Lab Results  Component Value Date   HGBA1C 7.2 (A) 09/28/2022    Assessment & Plan   1. Palpitations/PSVT: Monitor in 04/2022 revealed predominantly sinus rhythm, 1 run of NSVT, 1684 runs of SVT, fastest interval lasting 13 seconds with a max heart rate of 218 bpm, longest lasting 2 minutes and 12 seconds with an average heart rate of 139 bpm.  SVT was detected within +/- 45 seconds of symptomatic patient events were PACs and PVCs.  Echocardiogram was essentially normal. He takes methylphenidate daily for his ADHD, which has possibly contributed to his symptoms.  He was started on metoprolol.  He notes that he has only been taking half of his dose of daily metoprolol by mistake.  He continues to note palpitations later in the day as his metoprolol begins to wear off.  He will increase his metoprolol as previously prescribed and continue to monitor symptoms. Consider possible EP referral if he continues to have significant symptoms despite beta-blocker therapy.  Continue metoprolol.   2. Chest pressure/fatigue/CAD: Coronary CT angiogram in 06/2022 revealed coronary calcium score of 71 (87 percentile), soft plaque in the distal left circumflex, negative FFR.  He notes some generalized fatigue, mild dyspnea on exertion, this has been noticeable since he had COVID-19.  Denies any other symptoms concerning for angina.  Continue metoprolol, olmesartan, chlorthalidone, amlodipine, and Crestor.   3. Hypertension: BP well controlled. Continue current antihypertensive regimen.   4. Hyperlipidemia: dLDL was 130 in 03/2022.  He was started on Crestor.  Repeat labs were drawn per PCP,  records not available. If LDL remains elevated above goal, consider escalation of statin therapy.   5. Type 2 diabetes/Obesity: A1c was 7.2 in 09/2022.  Recently started on Ozempic.  Monitored and managed per PCP.  Encouraged ongoing lifestyle modifications with diet and exercise.     6. OSA: Reports adherence to CPAP.   7. Disposition: Follow-up in 6 months, sooner if needed.      Joylene Grapes, NP 10/20/2022, 2:07 PM

## 2022-11-08 DIAGNOSIS — M25512 Pain in left shoulder: Secondary | ICD-10-CM | POA: Diagnosis not present

## 2022-11-15 ENCOUNTER — Encounter: Payer: Self-pay | Admitting: Family Medicine

## 2022-11-15 DIAGNOSIS — E1165 Type 2 diabetes mellitus with hyperglycemia: Secondary | ICD-10-CM

## 2022-11-15 MED ORDER — SEMAGLUTIDE(0.25 OR 0.5MG/DOS) 2 MG/3ML ~~LOC~~ SOPN: 0.5000 mg | PEN_INJECTOR | SUBCUTANEOUS | 5 refills | Status: DC

## 2022-11-24 ENCOUNTER — Encounter (INDEPENDENT_AMBULATORY_CARE_PROVIDER_SITE_OTHER): Payer: Self-pay

## 2022-12-01 ENCOUNTER — Other Ambulatory Visit: Payer: Self-pay | Admitting: Family Medicine

## 2022-12-01 DIAGNOSIS — E1165 Type 2 diabetes mellitus with hyperglycemia: Secondary | ICD-10-CM

## 2022-12-22 ENCOUNTER — Other Ambulatory Visit (INDEPENDENT_AMBULATORY_CARE_PROVIDER_SITE_OTHER): Payer: BC Managed Care – PPO

## 2022-12-22 DIAGNOSIS — E1165 Type 2 diabetes mellitus with hyperglycemia: Secondary | ICD-10-CM

## 2022-12-22 LAB — COMPREHENSIVE METABOLIC PANEL
ALT: 29 U/L (ref 0–53)
AST: 17 U/L (ref 0–37)
Albumin: 4.3 g/dL (ref 3.5–5.2)
Alkaline Phosphatase: 56 U/L (ref 39–117)
BUN: 22 mg/dL (ref 6–23)
CO2: 23 meq/L (ref 19–32)
Calcium: 9.9 mg/dL (ref 8.4–10.5)
Chloride: 105 meq/L (ref 96–112)
Creatinine, Ser: 1.01 mg/dL (ref 0.40–1.50)
GFR: 86.86 mL/min (ref >60.00)
Glucose, Bld: 119 mg/dL — ABNORMAL HIGH (ref 70–99)
Potassium: 4.3 meq/L (ref 3.5–5.1)
Sodium: 139 meq/L (ref 135–145)
Total Bilirubin: 1 mg/dL (ref 0.2–1.2)
Total Protein: 7.3 g/dL (ref 6.0–8.3)

## 2022-12-22 LAB — LIPID PANEL
Cholesterol: 104 mg/dL (ref 0–200)
HDL: 36 mg/dL — ABNORMAL LOW (ref >39.00)
LDL Cholesterol: 25 mg/dL (ref 0–99)
NonHDL: 68.42
Total CHOL/HDL Ratio: 3
Triglycerides: 216 mg/dL — ABNORMAL HIGH (ref 0.0–149.0)
VLDL: 43.2 mg/dL — ABNORMAL HIGH (ref 0.0–40.0)

## 2022-12-22 LAB — MICROALBUMIN / CREATININE URINE RATIO
Creatinine,U: 119 mg/dL
Microalb Creat Ratio: 0.8 mg/g (ref 0.0–30.0)
Microalb, Ur: 0.9 mg/dL (ref 0.0–1.9)

## 2022-12-22 LAB — HEMOGLOBIN A1C: Hgb A1c MFr Bld: 6.8 % — ABNORMAL HIGH (ref 4.6–6.5)

## 2022-12-23 MED ORDER — SEMAGLUTIDE (1 MG/DOSE) 4 MG/3ML ~~LOC~~ SOPN
1.0000 mg | PEN_INJECTOR | SUBCUTANEOUS | 5 refills | Status: DC
Start: 2022-12-23 — End: 2023-05-29

## 2022-12-28 ENCOUNTER — Other Ambulatory Visit: Payer: Self-pay | Admitting: Family Medicine

## 2022-12-28 DIAGNOSIS — E1165 Type 2 diabetes mellitus with hyperglycemia: Secondary | ICD-10-CM

## 2022-12-29 ENCOUNTER — Ambulatory Visit: Payer: BC Managed Care – PPO | Admitting: Family Medicine

## 2023-01-20 ENCOUNTER — Encounter: Payer: Self-pay | Admitting: Family Medicine

## 2023-01-20 ENCOUNTER — Telehealth: Payer: BC Managed Care – PPO | Admitting: Family Medicine

## 2023-01-20 VITALS — BP 132/86 | Ht 73.0 in | Wt 233.2 lb

## 2023-01-20 DIAGNOSIS — R110 Nausea: Secondary | ICD-10-CM | POA: Diagnosis not present

## 2023-01-20 DIAGNOSIS — E782 Mixed hyperlipidemia: Secondary | ICD-10-CM

## 2023-01-20 MED ORDER — ROSUVASTATIN CALCIUM 20 MG PO TABS
10.0000 mg | ORAL_TABLET | Freq: Every day | ORAL | Status: DC
Start: 2023-01-20 — End: 2023-09-11

## 2023-01-20 MED ORDER — ONDANSETRON 4 MG PO TBDP
4.0000 mg | ORAL_TABLET | Freq: Three times a day (TID) | ORAL | 5 refills | Status: DC | PRN
Start: 2023-01-20 — End: 2023-04-13

## 2023-01-20 MED ORDER — FENOFIBRATE 54 MG PO TABS
54.0000 mg | ORAL_TABLET | Freq: Every day | ORAL | 1 refills | Status: DC
Start: 2023-01-20 — End: 2023-07-21

## 2023-01-20 NOTE — Progress Notes (Signed)
Virtual Medical Office Visit  Patient:  Michael Jacobs      Age: 52 y.o.       Sex:  male  Date:   01/20/2023  PCP:    Karie Georges, MD   Today's Healthcare Provider: Karie Georges, MD    Assessment/Plan:   Summary assessment:  Derrin was seen today for follow-up and medical management of chronic issues.  Nausea Secondary to the increase in Ozempc, will call in nausea medication for him to use PRN. Pt is very happy with the Ozempic and wants to continue the current dose despite the nausea since he is achieving his goal A1C and weight loss.   -     Ondansetron; Take 1 tablet (4 mg total) by mouth every 8 (eight) hours as needed for nausea or vomiting.  Dispense: 20 tablet; Refill: 5  Mixed hyperlipidemia Pt doing well on 1/2 tablet of the rosuvastatin. I reviewed his lipid panel with him today as well. Continue fenofibrate also  -     Rosuvastatin Calcium; Take 0.5 tablets (10 mg total) by mouth daily. Take 1/2 of tablet -     Fenofibrate; Take 1 tablet (54 mg total) by mouth daily.  Dispense: 90 tablet; Refill: 1     Return in about 6 months (around 07/21/2023) for DM-- come fasting for cholesterol testing .   He was advised to call the office or go to ER if his condition worsens    Subjective:   Michael Jacobs is a 52 y.o. male with PMH significant for: Past Medical History:  Diagnosis Date   Anxiety    Asthma    Cecal ulcer    Diabetes mellitus (HCC) 2017   GERD (gastroesophageal reflux disease)    Hiatal hernia    Hypercholesterolemia    Hypertension      Presenting today with: Chief Complaint  Patient presents with   Follow-up   Medical Management of Chronic Issues    Pt wants to go over labs for DM. Pt reports Ozempic has been making him feels quesy but is helping with losing weight. With 0.5 mg, 1mg . Doesn't want to get off of it as it is working. He states he feels better and reports he had lost about 24 lbs since June.      He  clarifies and reports that his condition: Patient wanted to go over his labs from Sept -- states that the 1 mg weekly of Ozempic is causing nausea with each meal. States that he is continuing on his low carb diet, has lost 24 pounds since June. States that he will sometime get nauseated even between meals, states it is worse in the middle of his shot Reports his stools are about every 4-5 days, states that the consistency still relatively normal.  He denies having any: , abdominal pain          Objective/Observations  Physical Exam:  Polite and friendly Gen: NAD, resting comfortably Pulm: Normal work of breathing Neuro: Grossly normal, moves all extremities Psych: Normal affect and thought content Problem specific physical exam findings: N/A    No images are attached to the encounter or orders placed in the encounter.  Temp 97.5 at home today  Results: No results found for any visits on 01/20/23.   Recent Results (from the past 2160 hour(s))  Microalbumin/Creatinine Ratio, Urine     Status: None   Collection Time: 12/22/22  8:57 AM  Result Value Ref Range  Microalb, Ur 0.9 0.0 - 1.9 mg/dL   Creatinine,U 829.5 mg/dL   Microalb Creat Ratio 0.8 0.0 - 30.0 mg/g  Hemoglobin A1c     Status: Abnormal   Collection Time: 12/22/22  8:57 AM  Result Value Ref Range   Hgb A1c MFr Bld 6.8 (H) 4.6 - 6.5 %    Comment: Glycemic Control Guidelines for People with Diabetes:Non Diabetic:  <6%Goal of Therapy: <7%Additional Action Suggested:  >8%   Lipid Panel     Status: Abnormal   Collection Time: 12/22/22  8:57 AM  Result Value Ref Range   Cholesterol 104 0 - 200 mg/dL    Comment: ATP III Classification       Desirable:  < 200 mg/dL               Borderline High:  200 - 239 mg/dL          High:  > = 621 mg/dL   Triglycerides 308.6 (H) 0.0 - 149.0 mg/dL    Comment: Normal:  <578 mg/dLBorderline High:  150 - 199 mg/dL   HDL 46.96 (L) >29.52 mg/dL   VLDL 84.1 (H) 0.0 - 32.4 mg/dL   LDL  Cholesterol 25 0 - 99 mg/dL   Total CHOL/HDL Ratio 3     Comment:                Men          Women1/2 Average Risk     3.4          3.3Average Risk          5.0          4.42X Average Risk          9.6          7.13X Average Risk          15.0          11.0                       NonHDL 68.42     Comment: NOTE:  Non-HDL goal should be 30 mg/dL higher than patient's LDL goal (i.e. LDL goal of < 70 mg/dL, would have non-HDL goal of < 100 mg/dL)  CMP     Status: Abnormal   Collection Time: 12/22/22  8:57 AM  Result Value Ref Range   Sodium 139 135 - 145 mEq/L   Potassium 4.3 3.5 - 5.1 mEq/L   Chloride 105 96 - 112 mEq/L   CO2 23 19 - 32 mEq/L   Glucose, Bld 119 (H) 70 - 99 mg/dL   BUN 22 6 - 23 mg/dL   Creatinine, Ser 4.01 0.40 - 1.50 mg/dL   Total Bilirubin 1.0 0.2 - 1.2 mg/dL   Alkaline Phosphatase 56 39 - 117 U/L   AST 17 0 - 37 U/L   ALT 29 0 - 53 U/L   Total Protein 7.3 6.0 - 8.3 g/dL   Albumin 4.3 3.5 - 5.2 g/dL   GFR 02.72 >53.66 mL/min    Comment: Calculated using the CKD-EPI Creatinine Equation (2021)   Calcium 9.9 8.4 - 10.5 mg/dL           Virtual Visit via Video   I connected with Michael Jacobs on 01/20/23 at  9:30 AM EDT by a video enabled telemedicine application and verified that I am speaking with the correct person using two identifiers. The limitations of evaluation and management by telemedicine and  the availability of in person appointments were discussed. The patient expressed understanding and agreed to proceed.   Percentage of appointment time on video:  100% Patient location: Home Provider location: [place] [employer] Persons participating in the virtual visit: Myself and Patient

## 2023-02-07 ENCOUNTER — Ambulatory Visit: Payer: BC Managed Care – PPO | Admitting: Family Medicine

## 2023-02-27 ENCOUNTER — Other Ambulatory Visit: Payer: Self-pay | Admitting: Family Medicine

## 2023-02-27 DIAGNOSIS — N529 Male erectile dysfunction, unspecified: Secondary | ICD-10-CM

## 2023-02-27 MED ORDER — SILDENAFIL CITRATE 20 MG PO TABS: ORAL_TABLET | ORAL | 0 refills | Status: DC

## 2023-02-27 NOTE — Telephone Encounter (Signed)
Please advise patient that I am not the prescriber on the xanax-- looks like he got this from a [employer] so he will need to continue filling it as it is a controlled medication. Then send back to me for refusal

## 2023-03-27 DIAGNOSIS — H35413 Lattice degeneration of retina, bilateral: Secondary | ICD-10-CM | POA: Diagnosis not present

## 2023-03-27 DIAGNOSIS — E119 Type 2 diabetes mellitus without complications: Secondary | ICD-10-CM | POA: Diagnosis not present

## 2023-03-27 DIAGNOSIS — H25013 Cortical age-related cataract, bilateral: Secondary | ICD-10-CM | POA: Diagnosis not present

## 2023-04-13 ENCOUNTER — Encounter: Payer: Self-pay | Admitting: Family Medicine

## 2023-04-13 DIAGNOSIS — R110 Nausea: Secondary | ICD-10-CM

## 2023-04-13 MED ORDER — ONDANSETRON 4 MG PO TBDP: 4.0000 mg | ORAL_TABLET | Freq: Three times a day (TID) | ORAL | 5 refills | Status: DC | PRN

## 2023-05-11 ENCOUNTER — Telehealth: Payer: Self-pay | Admitting: Pharmacist

## 2023-05-11 ENCOUNTER — Other Ambulatory Visit (HOSPITAL_COMMUNITY): Payer: Self-pay

## 2023-05-11 ENCOUNTER — Other Ambulatory Visit (HOSPITAL_BASED_OUTPATIENT_CLINIC_OR_DEPARTMENT_OTHER): Payer: Self-pay | Admitting: Cardiology

## 2023-05-11 ENCOUNTER — Other Ambulatory Visit: Payer: Self-pay | Admitting: Family Medicine

## 2023-05-11 DIAGNOSIS — E1165 Type 2 diabetes mellitus with hyperglycemia: Secondary | ICD-10-CM

## 2023-05-11 DIAGNOSIS — I10 Essential (primary) hypertension: Secondary | ICD-10-CM

## 2023-05-11 NOTE — Telephone Encounter (Signed)
Attempted to process an Ozempic Prior Authorization through [employer].  Received a message to call for authorization [phone]).  Spoke with plan and they will fax prior authorization forms.  We will completed and process as instructed once these are received.

## 2023-05-18 ENCOUNTER — Telehealth (HOSPITAL_BASED_OUTPATIENT_CLINIC_OR_DEPARTMENT_OTHER): Payer: Self-pay | Admitting: Cardiology

## 2023-05-18 DIAGNOSIS — I471 Supraventricular tachycardia, unspecified: Secondary | ICD-10-CM

## 2023-05-18 NOTE — Telephone Encounter (Signed)
 Via pt schedule patient requested an appt with Dr. Lonni stating:  I want to have the ablation surgery to fix my heart  I responded asking if he was having symptoms and he replied, Yes il feeling the flutter more and more and the medication makes me tired and impotent I'm taking 2 per day and it feels like I need more.   I think the surgery would fix the issue and could take me off the medication   I then sent him the [facility] questions and he responded with,   I've had these symptoms for over a year and been on medication and yes there is shortness of breath at times when it's acting up. It's all in my chart  Please advise.

## 2023-05-18 NOTE — Telephone Encounter (Signed)
 Discussed with Dr. Veryl Gottron. Referral sent to EP. Called and spoke to pt; he is aware of referral.

## 2023-05-21 ENCOUNTER — Encounter: Payer: Self-pay | Admitting: Family Medicine

## 2023-05-28 ENCOUNTER — Other Ambulatory Visit: Payer: Self-pay | Admitting: Family Medicine

## 2023-05-28 DIAGNOSIS — N529 Male erectile dysfunction, unspecified: Secondary | ICD-10-CM

## 2023-05-29 ENCOUNTER — Other Ambulatory Visit (HOSPITAL_COMMUNITY): Payer: Self-pay

## 2023-05-29 ENCOUNTER — Telehealth: Payer: Self-pay

## 2023-05-29 ENCOUNTER — Telehealth: Payer: Self-pay | Admitting: Family Medicine

## 2023-05-29 DIAGNOSIS — E1165 Type 2 diabetes mellitus with hyperglycemia: Secondary | ICD-10-CM

## 2023-05-29 MED ORDER — SEMAGLUTIDE (1 MG/DOSE) 4 MG/3ML ~~LOC~~ SOPN: 1.0000 mg | PEN_INJECTOR | SUBCUTANEOUS | 0 refills | Status: DC

## 2023-05-29 MED ORDER — SILDENAFIL CITRATE 20 MG PO TABS: ORAL_TABLET | ORAL | 0 refills | Status: DC

## 2023-05-29 NOTE — Telephone Encounter (Signed)
Rx done-patient informed via Mychart message.

## 2023-05-29 NOTE — Telephone Encounter (Signed)
Pharmacy Patient Advocate Encounter   Received notification from Pt Calls Messages that prior authorization for Ozempic (1 MG/DOSE) 4MG /3ML pen-injectors is required/requested.   Insurance verification completed.   The patient is insured through  [facility]  .   Called insurance at [phone]. Per representative, prescription must be filled for 3 month supply. Called pharmacy to fill for 3 months, per pharmacy staff, only 1 fill (1 month supply) is remaining on prescription. New prescription for 3 month supply is required.

## 2023-05-29 NOTE — Addendum Note (Signed)
Addended by: Johnella Moloney on: 05/29/2023 04:24 PM   Modules accepted: Orders

## 2023-05-29 NOTE — Telephone Encounter (Signed)
Copied from CRM [id number]. Topic: Clinical - Prescription Issue >> May 29, 2023  2:16 PM Theodis Sato wrote: Reason for CRM: Patient states his new insurance has not received the prior authorization request for his Ozempic and states they are "waiting" on [place]. Advised patient per the notes the prior authorization was send to his insurance for review and to follow up with them.  *patient is out of medication*

## 2023-05-29 NOTE — Telephone Encounter (Signed)
PA request has been Started. New Encounter created for follow up. For additional info see Pharmacy Prior Auth telephone encounter from 05/29/23.

## 2023-06-07 ENCOUNTER — Other Ambulatory Visit (HOSPITAL_BASED_OUTPATIENT_CLINIC_OR_DEPARTMENT_OTHER): Payer: Self-pay | Admitting: Cardiology

## 2023-06-07 DIAGNOSIS — I10 Essential (primary) hypertension: Secondary | ICD-10-CM

## 2023-06-20 ENCOUNTER — Encounter: Payer: Self-pay | Admitting: Nurse Practitioner

## 2023-06-20 ENCOUNTER — Ambulatory Visit: Payer: BC Managed Care – PPO | Attending: Nurse Practitioner | Admitting: Nurse Practitioner

## 2023-06-20 VITALS — BP 110/80 | HR 96 | Ht 72.0 in | Wt 233.8 lb

## 2023-06-20 DIAGNOSIS — R002 Palpitations: Secondary | ICD-10-CM | POA: Diagnosis not present

## 2023-06-20 DIAGNOSIS — I2510 Atherosclerotic heart disease of native coronary artery without angina pectoris: Secondary | ICD-10-CM | POA: Diagnosis not present

## 2023-06-20 DIAGNOSIS — R0602 Shortness of breath: Secondary | ICD-10-CM

## 2023-06-20 DIAGNOSIS — I471 Supraventricular tachycardia, unspecified: Secondary | ICD-10-CM | POA: Diagnosis not present

## 2023-06-20 DIAGNOSIS — E785 Hyperlipidemia, unspecified: Secondary | ICD-10-CM

## 2023-06-20 DIAGNOSIS — I10 Essential (primary) hypertension: Secondary | ICD-10-CM

## 2023-06-20 DIAGNOSIS — E66812 Obesity, class 2: Secondary | ICD-10-CM

## 2023-06-20 DIAGNOSIS — E1165 Type 2 diabetes mellitus with hyperglycemia: Secondary | ICD-10-CM

## 2023-06-20 DIAGNOSIS — Z6836 Body mass index (BMI) 36.0-36.9, adult: Secondary | ICD-10-CM

## 2023-06-20 DIAGNOSIS — G4733 Obstructive sleep apnea (adult) (pediatric): Secondary | ICD-10-CM

## 2023-06-20 NOTE — Patient Instructions (Signed)
 Medication Instructions:  Your physician recommends that you continue on your current medications as directed. Please refer to the Current Medication list given to you today.  *If you need a refill on your cardiac medications before your next appointment, please call your pharmacy*   Lab Work: NONE ordered at this time of appointment   Testing/Procedures: NONE ordered at this time of appointment   Follow-Up: At [facility], you and your health needs are our priority.  As part of our continuing mission to provide you with exceptional heart care, we have created designated Provider Care Teams.  These Care Teams include your primary Cardiologist (physician) and Advanced Practice Providers (APPs -  Physician Assistants and Nurse Practitioners) who all work together to provide you with the care you need, when you need it.  We recommend signing up for the patient portal called "MyChart".  Sign up information is provided on this After Visit Summary.  MyChart is used to connect with patients for Virtual Visits (Telemedicine).  Patients are able to view lab/test results, encounter notes, upcoming appointments, etc.  Non-urgent messages can be sent to your provider as well.   To learn more about what you can do with MyChart, go to [email or web address].    Your next appointment:   6 month(s)  Provider:   Jodelle Red, MD or Bernadene Person NP

## 2023-06-20 NOTE — Progress Notes (Signed)
 Office Visit    Patient Name: Michael Jacobs Date of Encounter: 06/20/2023  Primary Care Provider:  Karie Georges, MD Primary Cardiologist:  Jodelle Red, MD  Chief Complaint    53 year old male with a history of CAD, palpitations, PSVT, hypertension, hyperlipidemia, type 2 diabetes, asthma, anxiety,  OSA, ADHD, and GERD who presents for follow-up related to CAD and palpitations.   Past Medical History    Past Medical History:  Diagnosis Date   Anxiety    Asthma    Cecal ulcer    Diabetes mellitus (HCC) 2017   GERD (gastroesophageal reflux disease)    Hiatal hernia    Hypercholesterolemia    Hypertension    Past Surgical History:  Procedure Laterality Date   COLONOSCOPY  2015   EYE SURGERY     04/2020- Retina repair bilateral eyes by Dr. Ashley Royalty per patient    lasik Bilateral    POLYPECTOMY     TONSILLECTOMY AND ADENOIDECTOMY     UPPER GASTROINTESTINAL ENDOSCOPY     WISDOM TOOTH EXTRACTION      Allergies  Allergies  Allergen Reactions   Ace Inhibitors Cough   Phenergan [Promethazine Hcl] Anxiety     Labs/Other Studies Reviewed    The following studies were reviewed today:  Cardiac Studies & Procedures   ______________________________________________________________________________________________     ECHOCARDIOGRAM  ECHOCARDIOGRAM COMPLETE 07/19/2022  Narrative ECHOCARDIOGRAM REPORT    Patient Name:   Michael Jacobs Date of Exam: 07/19/2022 Medical Rec #:  [id number]        Height:       72.0 in Accession #:    [id number]       Weight:       262.4 lb Date of Birth:  1970-12-07         BSA:          2.391 m Patient Age:    51 years         BP:           130/90 mmHg Patient Gender: M                HR:           84 bpm. Exam Location:  [address]  Procedure: 2D Echo, 3D Echo, Cardiac Doppler and Color Doppler  Indications:    R00.2 Palpitations  History:        Patient has no prior history of Echocardiogram  examinations. Arrythmias:PSVT, Signs/Symptoms:Chest Pain, Dizziness/Lightheadedness and Fatigue; Risk Factors:Family History of Coronary Artery Disease, Hypertension, Diabetes, Dyslipidemia and Former Smoker.  Sonographer:    Farrel Conners RDCS Referring Phys: Micki Cassel C Ovetta Bazzano  IMPRESSIONS   1. Left ventricular ejection fraction, by estimation, is 60 to 65%. The left ventricle has normal function. The left ventricle has no regional wall motion abnormalities. Left ventricular diastolic parameters were normal. 2. Right ventricular systolic function is normal. The right ventricular size is normal. Tricuspid regurgitation signal is inadequate for assessing PA pressure. 3. The mitral valve is normal in structure. Trivial mitral valve regurgitation. No evidence of mitral stenosis. 4. The aortic valve is tricuspid. Aortic valve regurgitation is not visualized. No aortic stenosis is present. 5. Aortic dilatation noted. There is borderline dilatation of the ascending aorta, measuring 38 mm.  Comparison(s): No prior Echocardiogram.  Conclusion(s)/Recommendation(s): Otherwise normal echocardiogram, with minor abnormalities described in the report.  FINDINGS Left Ventricle: Left ventricular ejection fraction, by estimation, is 60 to 65%. The left ventricle has normal function. The left  ventricle has no regional wall motion abnormalities. The left ventricular internal cavity size was normal in size. There is no left ventricular hypertrophy. Left ventricular diastolic parameters were normal.  Right Ventricle: The right ventricular size is normal. Right vetricular wall thickness was not well visualized. Right ventricular systolic function is normal. Tricuspid regurgitation signal is inadequate for assessing PA pressure.  Left Atrium: Left atrial size was normal in size.  Right Atrium: Right atrial size was normal in size.  Pericardium: There is no evidence of pericardial effusion.  Mitral Valve:  The mitral valve is normal in structure. Trivial mitral valve regurgitation. No evidence of mitral valve stenosis.  Tricuspid Valve: The tricuspid valve is grossly normal. Tricuspid valve regurgitation is not demonstrated. No evidence of tricuspid stenosis.  Aortic Valve: The aortic valve is tricuspid. Aortic valve regurgitation is not visualized. No aortic stenosis is present.  Pulmonic Valve: The pulmonic valve was not well visualized. Pulmonic valve regurgitation is not visualized. No evidence of pulmonic stenosis.  Aorta: Aortic dilatation noted. There is borderline dilatation of the ascending aorta, measuring 38 mm.  Venous: The inferior vena cava was not well visualized.  IAS/Shunts: The atrial septum is grossly normal.   LEFT VENTRICLE PLAX 2D LVIDd:         4.95 cm   Diastology LVIDs:         3.70 cm   LV e' medial:    8.70 cm/s LV PW:         1.00 cm   LV E/e' medial:  5.9 LV IVS:        0.95 cm   LV e' lateral:   12.30 cm/s LVOT diam:     2.40 cm   LV E/e' lateral: 4.2 LV SV:         86 LV SV Index:   36 LVOT Area:     4.52 cm  3D Volume EF: 3D EF:        62 % LV EDV:       122 ml LV ESV:       46 ml LV SV:        76 ml  RIGHT VENTRICLE RV Basal diam:  3.60 cm RV S prime:     8.70 cm/s TAPSE (M-mode): 1.7 cm  LEFT ATRIUM             Index        RIGHT ATRIUM           Index LA diam:        3.80 cm 1.59 cm/m   RA Pressure: 3.00 mmHg LA Vol (A2C):   55.1 ml 23.04 ml/m  RA Area:     14.00 cm LA Vol (A4C):   47.6 ml 19.91 ml/m  RA Volume:   36.70 ml  15.35 ml/m LA Biplane Vol: 51.5 ml 21.54 ml/m AORTIC VALVE LVOT Vmax:   90.10 cm/s LVOT Vmean:  57.900 cm/s LVOT VTI:    0.190 m  AORTA Ao Root diam: 3.50 cm Ao Asc diam:  3.80 cm  MITRAL VALVE               TRICUSPID VALVE MV Area (PHT)  cm         Estimated RAP:  3.00 mmHg MV Decel Time: 217 msec MV E velocity: 51.65 cm/s  SHUNTS MV A velocity: 59.25 cm/s  Systemic VTI:  0.19 m MV E/A ratio:  0.87         Systemic Diam: 2.40  cm  Jodelle Red MD Electronically signed by Jodelle Red MD Signature Date/Time: 07/19/2022/1:07:51 PM    Final    MONITORS  LONG TERM MONITOR (3-14 DAYS) 05/24/2022  Narrative Patch Wear Time:  13 days and 21 hours  [name], [name], [name]. Predominant underlying rhythm was Sinus Rhythm. 1 run of Ventricular Tachycardia occurred lasting 4 beats with a max rate of 133 bpm (avg 128 bpm). 1684 Supraventricular Tachycardia runs occurred, the run with the fastest interval lasting 13.0 secs with a max rate of 218 bpm, the longest lasting 2 mins 12 secs with an avg rate of 139 bpm. Isolated SVEs were occasional (1.7%), ventricular ectopy was rare (<1%). There were 44 triggered events, which were typically associated with SVT.   CT SCANS  CT CORONARY MORPH W/CTA COR W/SCORE 07/01/2022  Addendum 07/05/2022  4:22 AM ADDENDUM REPORT: 07/05/2022 04:19  EXAM: OVER-READ INTERPRETATION  CT CHEST  The following report is an over-read performed by radiologist Dr. Donetta Potts [facility] on 07/05/2022. This over-read does not include interpretation of cardiac or coronary anatomy or pathology. The cardiac interpretation by the cardiologist is attached.  COMPARISON:  11/02/2015  FINDINGS: The heart is normal in size and there is a trace pericardial effusion. Scattered coronary artery calcifications are noted. The aorta and pulmonary trunk are normal in caliber.  No mediastinal, hilar, or axillary lymphadenopathy. The visualized esophagus is within normal limits.  Mild atelectasis is noted bilaterally.  No effusion or pneumothorax.  Hepatic steatosis is noted. No acute abnormality in the upper abdomen.  Degenerative changes are present in the thoracic spine. No acute osseous abnormality.  IMPRESSION: 1. Coronary artery calcifications. 2. Hepatic steatosis.   Electronically  Signed By: Thornell Sartorius M.D. On: 07/05/2022 04:19  Narrative CLINICAL DATA:  This is a 53 year old male with anginal symptoms.  EXAM: Cardiac/Coronary  CTA  TECHNIQUE: The patient was scanned on a [employer].  FINDINGS: A 100 kV prospective scan was triggered in the descending thoracic aorta at 111 HU's. Axial non-contrast 3 mm slices were carried out through the heart. The data set was analyzed on a dedicated work station and scored using the Agatson method. Gantry rotation speed was 250 msecs and collimation was .6 mm. No beta blockade and 0.8 mg of sl NTG was given. The 3D data set was reconstructed in 5% intervals of the 67-82 % of the R-R cycle. Diastolic phases were analyzed on a dedicated work station using MPR, MIP and VRT modes. The patient received 80 cc of contrast.  Aorta: Normal size.  No calcifications.  No dissection.  Aortic Valve:  Trileaflet.  No calcifications.  Coronary Arteries:  Normal coronary origin.  Right dominance.  RCA is a large dominant artery that gives rise to PDA and PLA. There is no plaque.  Left main is a large artery that gives rise to LAD and LCX arteries.  LAD is a large vessel. Mild (25-49%) calcified plaque in the proximal LAD. The mid LAD with mild focal calcified plaque. The distal LAD with no plaques.  LCX is a non-dominant artery that gives rise to one large OM1 branch. There is a focal minimal (<24%) calcified plaque in the mid LCX. The distal part of the vessel appears to have a soft plaque is present unable to quantify as this plaques sits at bend in the vessel. The proximal portion of the vessel with  no plaques. OM1 vessel is a large vessel with no plaques.  Coronary Calcium Score:  Left main: 0  Left anterior descending artery: 59  Left circumflex artery: 12  Right coronary artery: 0  Total: 71  Percentile: 87  Other findings:  Normal pulmonary vein drainage into the left atrium.  Normal left  atrial appendage without a thrombus.  Normal size of the pulmonary artery.  IMPRESSION: 1. Coronary calcium score of 71. This was 28 percentile for age and sex matched control.  2. Normal coronary origin with right dominance.  3. CAD-RADS 2. Mild non-obstructive CAD (25-49%). Consider non-atherosclerotic causes of chest pain. Consider preventive therapy and risk factor modification. Additional analysis with CT FFR will be submitted for soft plaque in the distal LCX.  The noncardiac portion of this study will be interpreted in separate report by the radiologist.  Electronically Signed: By: Thomasene Ripple D.O. On: 07/01/2022 11:25     ______________________________________________________________________________________________     Recent Labs: 12/22/2022: ALT 29; BUN 22; Creatinine, Ser 1.01; Potassium 4.3; Sodium 139  Recent Lipid Panel    Component Value Date/Time   CHOL 104 12/22/2022 0857   TRIG 216.0 (H) 12/22/2022 0857   HDL 36.00 (L) 12/22/2022 0857   CHOLHDL 3 12/22/2022 0857   VLDL 43.2 (H) 12/22/2022 0857   LDLCALC 25 12/22/2022 0857   LDLCALC 105 (H) 11/05/2019 0916   LDLDIRECT 130.0 03/29/2022 0858    History of Present Illness    53 year old male with the above past medical history including CAD, palpitations, PSVT, hypertension, hyperlipidemia, type 2 diabetes, asthma, anxiety, OSA, ADHD and GERD.   He was referred to Dr. Cristal Deer in 04/2022 in the setting of palpitations.  14-day ZIO revealed predominantly sinus rhythm, 1 run of NSVT, 1684 runs of SVT, fastest interval lasting 13 seconds with a max heart rate of 218 bpm, longest lasting 2 minutes and 12 seconds with an average heart rate of 139 bpm.  SVT was detected within +/- 45 seconds of symptomatic patient events were PACs and PVCs.  Echocardiogram was essentially normal.  Coronary CT angiogram revealed coronary calcium score of 71 (87 percentile), soft plaque in the distal left circumflex, negative FFR.   He was started on metoprolol and Crestor. He was last seen in the office on 10/20/2022 and was stable overall from a cardiac standpoint. He noted ongoing intermittent palpitations.He was only taking half of his prescribed metoprolol dose at the time. He was advised to increase his metoprolol to prescribed dose.   He presents today for follow-up. Since his last visit he has done well from a cardiac standpoint. He does continue to note daily palpitations.  He denies any associated chest pain, shortness of breath, dizziness. His biggest concern is that he must take metoprolol to help control his symptoms of palpitations, however, he believes that the medication is causing significant fatigue and erectile dysfunction.  He is hesitant to increase his dose of metoprolol to further control his symptoms, as he is concerned that this will worsen side effects. He has an appointment scheduled with our electrophysiology team to discuss possible ablation.  Home Medications    Current Outpatient Medications  Medication Sig Dispense Refill   albuterol (VENTOLIN HFA) 108 (90 Base) MCG/ACT inhaler Inhale 2 puffs into the lungs every 6 (six) hours as needed. 8.5 g 2   ALPRAZolam (XANAX) 0.5 MG tablet Take 0.5 mg by mouth as needed for anxiety or sleep.     amLODipine (NORVASC) 10 MG tablet  TAKE 1 TABLET BY MOUTH DAILY 15 tablet 0   chlorthalidone (HYGROTON) 25 MG tablet TAKE 1 TABLET BY MOUTH DAILY 30 tablet 0   fenofibrate 54 MG tablet Take 1 tablet (54 mg total) by mouth daily. 90 tablet 1   JARDIANCE 10 MG TABS tablet TAKE 1 TABLET BY MOUTH DAILY BEFORE BREAKFAST; **MUST CALL MD FOR APPOINTMENT FOR FURTHER REFILLS 90 tablet 3   JORNAY PM 20 MG CP24 Take 1 capsule by mouth at bedtime.     metFORMIN (GLUCOPHAGE) 1000 MG tablet TAKE 1 TABLET BY MOUTH TWICE A DAY WITH A MEAL   **MUST CALL MD FOR APPOINTMENT FOR FURTHER REFILLS** 180 tablet 0   metoprolol tartrate 37.5 MG TABS Take 1 tablet (37.5 mg total) by mouth 2  (two) times daily. 180 tablet 3   olmesartan (BENICAR) 40 MG tablet Take 1 tablet (40 mg total) by mouth daily. 90 tablet 3   ondansetron (ZOFRAN-ODT) 4 MG disintegrating tablet Take 1 tablet (4 mg total) by mouth every 8 (eight) hours as needed for nausea or vomiting. 20 tablet 5   pantoprazole (PROTONIX) 40 MG tablet Take 1 tablet (40 mg total) by mouth 2 (two) times daily. 90 tablet 3   rosuvastatin (CRESTOR) 20 MG tablet Take 0.5 tablets (10 mg total) by mouth daily. Take 1/2 of tablet     Semaglutide, 1 MG/DOSE, 4 MG/3ML SOPN Inject 1 mg as directed once a week. 9 mL 0   sildenafil (REVATIO) 20 MG tablet TAKE 3 TO 5 TABLETS BY MOUTH AS NEEDED PRIOR TO SEXUAL INTERCOURSE; DO NOT TAKE MORE THAN 1 DOSE IN 24 HOURS ! 90 tablet 0   traZODone (DESYREL) 50 MG tablet Take 0.5-1 tablets (25-50 mg total) by mouth at bedtime as needed for sleep. 90 tablet 0   No current facility-administered medications for this visit.     Review of Systems    He denies chest pain, dyspnea, pnd, orthopnea, n, v, dizziness, syncope, edema, weight gain, or early satiety. All other systems reviewed and are otherwise negative except as noted above.   Physical Exam    VS:  BP 110/80   Pulse 96   Ht 6' (1.829 m)   Wt 233 lb 12.8 oz (106.1 kg)   SpO2 97%   BMI 31.71 kg/m   GEN: Well nourished, well developed, in no acute distress. HEENT: normal. Neck: Supple, no JVD, carotid bruits, or masses. Cardiac: RRR, no murmurs, rubs, or gallops. No clubbing, cyanosis, edema.  Radials/DP/PT 2+ and equal bilaterally.  Respiratory:  Respirations regular and unlabored, clear to auscultation bilaterally. GI: Soft, nontender, nondistended, BS + x 4. MS: no deformity or atrophy. Skin: warm and dry, no rash. Neuro:  Strength and sensation are intact. Psych: Normal affect.  Accessory Clinical Findings    ECG personally reviewed by me today - EKG Interpretation Date/Time:  Tuesday June 20 2023 08:19:02 EDT Ventricular Rate:   91 PR Interval:  154 QRS Duration:  82 QT Interval:  378 QTC Calculation: 464 R Axis:   17  Text Interpretation: Sinus rhythm with Premature atrial complexes Nonspecific T wave abnormality Prolonged QT Confirmed by Bernadene Person ([id number]) on 06/20/2023 8:27:28 AM  - no acute changes.   Lab Results  Component Value Date   WBC 8.7 03/29/2022   HGB 17.6 (H) 03/29/2022   HCT 51.2 03/29/2022   MCV 88.8 03/29/2022   PLT 267.0 03/29/2022   Lab Results  Component Value Date   CREATININE 1.01 12/22/2022  BUN 22 12/22/2022   NA 139 12/22/2022   K 4.3 12/22/2022   CL 105 12/22/2022   CO2 23 12/22/2022   Lab Results  Component Value Date   ALT 29 12/22/2022   AST 17 12/22/2022   ALKPHOS 56 12/22/2022   BILITOT 1.0 12/22/2022   Lab Results  Component Value Date   CHOL 104 12/22/2022   HDL 36.00 (L) 12/22/2022   LDLCALC 25 12/22/2022   LDLDIRECT 130.0 03/29/2022   TRIG 216.0 (H) 12/22/2022   CHOLHDL 3 12/22/2022    Lab Results  Component Value Date   HGBA1C 6.8 (H) 12/22/2022    Assessment & Plan    1. Palpitations/PSVT: Monitor in 04/2022 revealed predominantly sinus rhythm, 1 run of NSVT, 1684 runs of SVT, fastest interval lasting 13 seconds with a max heart rate of 218 bpm, longest lasting 2 minutes and 12 seconds with an average heart rate of 139 bpm.  SVT was detected within +/- 45 seconds of symptomatic patient events were PACs and PVCs.  Echocardiogram was essentially normal. He takes methylphenidate daily for his ADHD, which has possibly contributed to his symptoms.  EKG today shows sinus rhythm with PACs.  He continues to note daily palpitations.  He is taking his metoprolol as prescribed, however, he is hesitant to increase his dose further due to concern for side effects of fatigue and ED.  He has requested a referral to EP for consideration of possible ablation.  Appointment has been scheduled for 07/04/2023.  For now, continue metoprolol at current dose.    2. CAD:  Coronary CT angiogram in 06/2022 revealed coronary calcium score of 71 (87 percentile), soft plaque in the distal left circumflex, negative FFR.  Stable with no anginal symptoms. No indication for ischemic evaluation.  Continue metoprolol, olmesartan, chlorthalidone, amlodipine, and Crestor.   3. Hypertension: BP well controlled. Continue current antihypertensive regimen.    4. Hyperlipidemia: LDL was 25 in 12/2022. Continue Crestor.    5. Type 2 diabetes/Obesity: A1c was 6.8 in 12/2022. Monitored and managed per PCP.  He has lost weight since starting Ozempic.  Encouraged ongoing lifestyle modifications with diet and exercise.     6. OSA: Reports adherence to CPAP.   7. Disposition: Follow-up as scheduled with EP, follow-up in 6 months with Dr. Cristal Deer.      Joylene Grapes, NP 06/20/2023, 9:21 AM

## 2023-06-29 NOTE — Progress Notes (Unsigned)
 Electrophysiology Office Note:   Date:  06/30/2023  ID:  Michael Jacobs, DOB [birth year], MRN [id number]  Primary Cardiologist: Jodelle Red, MD Electrophysiologist: Nobie Putnam, MD      History of Present Illness:   Michael Jacobs is a 53 y.o. male with h/o non-obstructive CAD, palpitations, PSVT, hypertension, hyperlipidemia, type 2 diabetes, asthma, anxiety, OSA, ADHD, and GERD who is being seen today for evaluation of his SVT at the request of Dr. Cristal Deer.   Discussed the use of AI scribe software for clinical note transcription with the patient, who gave verbal consent to proceed.  History of Present Illness He has been experiencing palpitations and a sensation of his heart racing, which began two years ago after a COVID-19 infection. The sensation is described as something moving from his heart to his throat, occurring daily if he does not take his medication. The palpitations are triggered by physical activity, such as getting up from a chair or climbing stairs, and are exacerbated by caffeine and sugar intake.  He is on medication for these symptoms, taking 37.5 mg twice a day, which helps reduce the frequency but does not completely eliminate the episodes. The palpitations can occur in the morning and are more frequent if he misses a dose.  There is a family history of heart rhythm issues, with his twin brother having had atrial fibrillation and his sister having undergone an ablation for supraventricular tachycardia. His parents did not have similar issues.  He has lost 30 pounds recently and reports that he no longer has sleep apnea, which was previously mild and managed with a CPAP machine. His wife has noted that he no longer snores. No current use of a CPAP machine.   Review of systems complete and found to be negative unless listed in HPI.   EP Information / Studies Reviewed:    EKG is not ordered today. EKG from 06/20/23 reviewed which showed sinus rhythm  with PACs.      Echo 07/19/22:  1. Left ventricular ejection fraction, by estimation, is 60 to 65%. The  left ventricle has normal function. The left ventricle has no regional  wall motion abnormalities. Left ventricular diastolic parameters were  normal.   2. Right ventricular systolic function is normal. The right ventricular  size is normal. Tricuspid regurgitation signal is inadequate for assessing  PA pressure.   3. The mitral valve is normal in structure. Trivial mitral valve  regurgitation. No evidence of mitral stenosis.   4. The aortic valve is tricuspid. Aortic valve regurgitation is not  visualized. No aortic stenosis is present.   5. Aortic dilatation noted. There is borderline dilatation of the  ascending aorta, measuring 38 mm.   Coronary CTA 07/01/22:  IMPRESSION: 1. Coronary calcium score of 71. This was 38 percentile for age and sex matched control.   2. Normal coronary origin with right dominance.   3. CAD-RADS 2. Mild non-obstructive CAD (25-49%). Consider non-atherosclerotic causes of chest pain. Consider preventive therapy and risk factor modification. Additional analysis with CT FFR will be submitted for soft plaque in the distal LCX. -CT FFR analysis showed no significant stenosis.   Zio 05/2022:  Patch Wear Time:  13 days and 21 hours    [name], [name], [name]. Predominant underlying rhythm was Sinus Rhythm. 1 run of Ventricular Tachycardia occurred lasting 4 beats with a max rate of 133 bpm (avg 128 bpm). 1684  Supraventricular Tachycardia runs occurred, the run with the fastest interval lasting 13.0 secs with a max rate of 218 bpm, the longest lasting 2 mins 12 secs with an avg rate of 139 bpm. Isolated SVEs were occasional (1.7%), ventricular ectopy was rare (<1%). There were 44 triggered events, which were typically associated with SVT.    Physical Exam:   VS:  BP 118/80   Pulse 90   Ht 6' (1.829 m)   Wt  230 lb 12.8 oz (104.7 kg)   SpO2 99%   BMI 31.30 kg/m    Wt Readings from Last 3 Encounters:  06/30/23 230 lb 12.8 oz (104.7 kg)  06/20/23 233 lb 12.8 oz (106.1 kg)  01/20/23 233 lb 3.2 oz (105.8 kg)     GEN: Well nourished, well developed in no acute distress NECK: No JVD CARDIAC: Normal rate, regular rhythm RESPIRATORY:  Clear to auscultation without rales, wheezing or rhonchi  ABDOMEN: Soft, non-distended EXTREMITIES:  No edema; No deformity   ASSESSMENT AND PLAN:    # Paroxysmal SVT: Zio monitor looks more consistent with bursts of atrial tachycardia. There are not long stretches of narrow, regular tachycardia which would be more consistent with AVNRT or AVRT. Given his risk factors, my suspicion is for PV atrial tach/early AF. # Palpitations:  - Repeat Zio monitor to look for more sustained episodes of tachycardia and to monitor for AF.  - Continue metoprolol 37.5mg  BID for now.  - Risk, benefits, and alternatives to EP study and radiofrequency ablation for SVT were also discussed in detail today. These risks include but are not limited to complete heart block, stroke, bleeding, vascular damage, tamponade, perforation, and death. The patient understands these risk and wishes to proceed.  We will therefore proceed with catheter ablation after results of Zio monitor are obtained.  Carto and ICE are requested for the procedure.   # Hypertension -At goal today.  Recommend checking blood pressures 1-2 times per week at home and recording the values.  Recommend bringing these recordings to the primary care physician. -Continue amlodipine 10mg  daily, continue olmesartan 40mg  daily.   # Non-obstructive CAD: Coronary CT angiogram in 06/2022 revealed coronary calcium score of 71 (87 percentile), soft plaque in the distal left circumflex, negative FFR.  Stable with no anginal symptoms.  -Continue metoprolol 37.5mg  BID, continue rosuvastatin 20mg  daily.    # Hyperlipidemia: LDL was 25 in  12/2022.  -Continue rosuvastatin 20mg  daily.    # Type 2 diabetes/Obesity: A1c was 6.8 in 12/2022. Monitored and managed per PCP.  He has lost 30lbs since starting Ozempic.   -Encouraged ongoing lifestyle modifications with diet and exercise.     # OSA: Has stopped CPAP since losing weight. His wife tells him he no longer snores. -Encouraged patient to reach out to his sleep provider to discuss whether he needs to continue CPAP or not, maybe pursue repeat sleep study following his weight loss. Advised him that untreated sleep apnea is a risk factor for AF.    Signed, Nobie Putnam, MD

## 2023-06-30 ENCOUNTER — Encounter: Payer: Self-pay | Admitting: Cardiology

## 2023-06-30 ENCOUNTER — Other Ambulatory Visit: Payer: Self-pay

## 2023-06-30 ENCOUNTER — Ambulatory Visit (INDEPENDENT_AMBULATORY_CARE_PROVIDER_SITE_OTHER)

## 2023-06-30 ENCOUNTER — Ambulatory Visit: Attending: Cardiology | Admitting: Cardiology

## 2023-06-30 VITALS — BP 118/80 | HR 90 | Ht 72.0 in | Wt 230.8 lb

## 2023-06-30 DIAGNOSIS — I471 Supraventricular tachycardia, unspecified: Secondary | ICD-10-CM

## 2023-06-30 DIAGNOSIS — E785 Hyperlipidemia, unspecified: Secondary | ICD-10-CM

## 2023-06-30 DIAGNOSIS — R002 Palpitations: Secondary | ICD-10-CM

## 2023-06-30 DIAGNOSIS — I10 Essential (primary) hypertension: Secondary | ICD-10-CM

## 2023-06-30 DIAGNOSIS — G4733 Obstructive sleep apnea (adult) (pediatric): Secondary | ICD-10-CM | POA: Diagnosis not present

## 2023-06-30 DIAGNOSIS — I2510 Atherosclerotic heart disease of native coronary artery without angina pectoris: Secondary | ICD-10-CM

## 2023-06-30 NOTE — Patient Instructions (Addendum)
 Medication Instructions:  Your physician recommends that you continue on your current medications as directed. Please refer to the Current Medication list given to you today.  *If you need a refill on your cardiac medications before your next appointment, please call your pharmacy*  Lab Work: BMET and CBC - you can go to any [facility] location to have these drawn within 30 days of your procedure  Testing/Procedures: Event Monitor Your physician has recommended that you wear an event monitor. Event monitors are medical devices that record the heart's electrical activity. Doctors most often [place] these monitors to diagnose arrhythmias. Arrhythmias are problems with the speed or rhythm of the heartbeat. The monitor is a small, portable device. You can wear one while you do your normal daily activities. This is usually used to diagnose what is causing palpitations/syncope (passing out).  EP Study/Ablation  Your physician has recommended that you have an EP Study. This test is used to assess serious arrhythmias (irregular heartbeats). During an Electro-physiology Study (EPS), a thin, flexible wire is passed through a vein in your groin (upper thigh) or neck up to the heart. The wire records the heart's electrical signals. Your doctor uses the wire to electrically stimulate your heart and trigger an arrhythmic. This allows the doctor to see whether an antiarrhythmia medicine can help manage the problem or if further procedures are necessary (i.e., ablation/ICD). Radiofrequency ablation, a procedure used to fix some types of arrthythmia, may be done during an EPS. This is done in the hospital and often requires an overnight stay. Please see the instruction sheet given to your today for more information.  Follow-Up: At [facility], you and your health needs are our priority.  As part of our continuing mission to provide you with exceptional heart care, we have created designated Provider Care Teams.   These Care Teams include your primary Cardiologist (physician) and Advanced Practice Providers (APPs -  Physician Assistants and Nurse Practitioners) who all work together to provide you with the care you need, when you need it.  Your next appointment:   4 weeks after your procedure  Provider:   You may see Nobie Putnam, MD or one of the following Advanced Practice Providers on your designated Care Team:   Francis Dowse, [place] [address]" [place], [place] Sherie Don, NP Canary Brim, NP

## 2023-06-30 NOTE — Progress Notes (Unsigned)
 Applied a 7 day Zio XT monitor to patient in the office

## 2023-07-04 ENCOUNTER — Ambulatory Visit: Payer: 59 | Admitting: Cardiovascular Disease

## 2023-07-06 ENCOUNTER — Other Ambulatory Visit (HOSPITAL_COMMUNITY): Payer: Self-pay

## 2023-07-06 ENCOUNTER — Telehealth: Payer: Self-pay

## 2023-07-06 NOTE — Telephone Encounter (Signed)
 Pharmacy Patient Advocate Encounter   Received notification from Onbase that prior authorization for [facility] is required/requested.   Insurance verification completed.   The patient is insured through  [employer]  .   Per test claim: Refill too soon. PA is not needed at this time. Medication was filled 05/29/23. Next eligible fill date is 07/31/23.  Per telephone encounter on 05/29/23, prescription must be filled for 3 month supply.   PA form in media for future use if needed.

## 2023-07-13 ENCOUNTER — Other Ambulatory Visit (HOSPITAL_COMMUNITY): Payer: Self-pay

## 2023-07-16 NOTE — Pre-Procedure Instructions (Signed)
 Attempted to call patient regarding procedure scheduled for 07/24/23 with anesthesia.  Anesthesia requires Ozempic to be held for 7 days before procedure.    Unable to leave message as mailbox is full.  The message to be would have been Ozempic to be held 7 days before procedure.  Last dose should be today until after your procedure.

## 2023-07-17 ENCOUNTER — Telehealth (HOSPITAL_COMMUNITY): Payer: Self-pay

## 2023-07-17 NOTE — Telephone Encounter (Signed)
 Call placed to patient to discuss upcoming procedure.   Labs: Pt forgot but plans to get labs completed today.    Any recent signs of acute illness or been started on antibiotics? No Any new medications started? No Any medications to hold? Semaglutide for 7 days, Metoprolol 5 days, Jardiance 3 days.  Medication instructions:  On the morning of your procedure DO NOT take any medication. Nothing to eat or drink after midnight prior to your procedure.  Confirmed patient is scheduled for SVT Ablation on Monday, April 14 with Dr. Michele Rockers. Instructed patient to arrive at the Main Entrance A at [facility]: [address] [place], [place] [address] and check in at Admitting at 7:30 AM  Advised of plan to go home the same day and will only stay overnight if medically necessary. You MUST have a responsible adult to drive you home and MUST be with you the first 24 hours after you arrive home or your procedure could be cancelled.  Patient verbalized understanding to all instructions provided and agreed to proceed with procedure.

## 2023-07-21 ENCOUNTER — Other Ambulatory Visit: Payer: Self-pay | Admitting: Family Medicine

## 2023-07-21 DIAGNOSIS — E782 Mixed hyperlipidemia: Secondary | ICD-10-CM

## 2023-07-21 LAB — CBC
Hematocrit: 45.1 % (ref 37.5–51.0)
Hemoglobin: 15.2 g/dL (ref 13.0–17.7)
MCH: 30.3 pg (ref 26.6–33.0)
MCHC: 33.7 g/dL (ref 31.5–35.7)
MCV: 90 fL (ref 79–97)
Platelets: 274 10*3/uL (ref 150–450)
RBC: 5.01 x10E6/uL (ref 4.14–5.80)
RDW: 14.1 % (ref 11.6–15.4)
WBC: 7.5 10*3/uL (ref 3.4–10.8)

## 2023-07-21 LAB — BASIC METABOLIC PANEL WITH GFR
BUN/Creatinine Ratio: 19 (ref 9–20)
BUN: 18 mg/dL (ref 6–24)
CO2: 21 mmol/L (ref 20–29)
Calcium: 9.8 mg/dL (ref 8.7–10.2)
Chloride: 104 mmol/L (ref 96–106)
Creatinine, Ser: 0.97 mg/dL (ref 0.76–1.27)
Glucose: 87 mg/dL (ref 70–99)
Potassium: 4.9 mmol/L (ref 3.5–5.2)
Sodium: 141 mmol/L (ref 134–144)
eGFR: 95 mL/min/{1.73_m2} (ref >59)

## 2023-07-21 NOTE — Pre-Procedure Instructions (Signed)
Instructed patient on the following items: Arrival time 0730 Nothing to eat or drink after midnight No meds AM of procedure Responsible person to drive you home and stay with you for 24 hrs

## 2023-07-23 DIAGNOSIS — R002 Palpitations: Secondary | ICD-10-CM

## 2023-07-23 DIAGNOSIS — I471 Supraventricular tachycardia, unspecified: Secondary | ICD-10-CM | POA: Diagnosis not present

## 2023-07-24 ENCOUNTER — Ambulatory Visit (HOSPITAL_COMMUNITY)
Admission: RE | Admit: 2023-07-24 | Discharge: 2023-07-24 | Disposition: A | Discharge status: Home / Self Care | Payer: PRIVATE HEALTH INSURANCE | Attending: Cardiology | Admitting: Cardiology

## 2023-07-24 ENCOUNTER — Ambulatory Visit (HOSPITAL_COMMUNITY)

## 2023-07-24 ENCOUNTER — Encounter (HOSPITAL_COMMUNITY)
Admission: RE | Disposition: A | Discharge status: Home / Self Care | Payer: Self-pay | Source: Home / Self Care | Attending: Cardiology

## 2023-07-24 ENCOUNTER — Other Ambulatory Visit: Payer: Self-pay

## 2023-07-24 DIAGNOSIS — Z539 Procedure and treatment not carried out, unspecified reason: Secondary | ICD-10-CM | POA: Insufficient documentation

## 2023-07-24 DIAGNOSIS — I471 Supraventricular tachycardia, unspecified: Secondary | ICD-10-CM | POA: Diagnosis present

## 2023-07-24 LAB — GLUCOSE, CAPILLARY: Glucose-Capillary: 107 mg/dL — ABNORMAL HIGH (ref 70–99)

## 2023-07-24 SURGERY — SVT ABLATION
Anesthesia: General

## 2023-07-24 MED ORDER — SODIUM CHLORIDE 0.9 % IV SOLN: INTRAVENOUS | Status: DC

## 2023-07-25 ENCOUNTER — Telehealth: Payer: Self-pay

## 2023-07-25 DIAGNOSIS — I471 Supraventricular tachycardia, unspecified: Secondary | ICD-10-CM

## 2023-07-25 NOTE — Telephone Encounter (Signed)
 Spoke to pt and informed him that his Insurance has approved his SVT ablation. He is now schduled for 5/19 at 12:00 with Dr. Daneil Dunker.   He is very upset that he was not notified that his Insurance had not approved his procedure on 4/14 when he was originally scheduled. He stated that he showed up and was there for 5 1/2 hours, he was prepped for procedure, shaved, IV was in before he was ever told his Insurance had not approved it yet.   He ask for proof that his Insurance had approved the procedure. I informed him that our office should get a letter from his Insurance Co but he should also get the same letter stating that it had been approved.   Pt is aware that he will need updated labs for the new procedure date.  I will send updated Instruction letter as soon as it is completed.

## 2023-07-25 NOTE — H&P (Signed)
 Patient presented for EP study and possible catheter ablation for symptomatic SVT.  Unfortunately, he had not yet received approval by his insurance carrier.  Because of this the procedure was canceled and rescheduled at a later date.   Ardeen Kohler, MD Cardiac Electrophysiology

## 2023-07-26 ENCOUNTER — Other Ambulatory Visit: Payer: Self-pay

## 2023-07-26 DIAGNOSIS — I471 Supraventricular tachycardia, unspecified: Secondary | ICD-10-CM

## 2023-08-08 ENCOUNTER — Other Ambulatory Visit: Payer: Self-pay | Admitting: Nurse Practitioner

## 2023-08-17 ENCOUNTER — Other Ambulatory Visit: Payer: Self-pay | Admitting: Family Medicine

## 2023-08-17 DIAGNOSIS — E1165 Type 2 diabetes mellitus with hyperglycemia: Secondary | ICD-10-CM

## 2023-08-22 ENCOUNTER — Ambulatory Visit: Admitting: Student

## 2023-08-22 ENCOUNTER — Telehealth (HOSPITAL_COMMUNITY): Payer: Self-pay

## 2023-08-22 NOTE — Telephone Encounter (Signed)
 Spoke with patient to discuss upcoming procedure.   Labs: patient will have completed 5/14.   Any recent signs of acute illness or been started on antibiotics? No Any new medications started? No Any medications to hold? Hold Semaglutide  for 7 days- last dose 5/5, Metoprolol  5 days- last dose 5/13, Jardiance  3 days- last dose 5/15. Any missed doses of blood thinner?   Medication instructions:  On the morning of your procedure DO NOT take any medication.procedure may be rescheduled. Nothing to eat or drink after midnight prior to your procedure.  Confirmed patient is scheduled for SVT Ablation on Monday, May 19 with Dr. Clinton Danas. Instructed patient to arrive at the Main Entrance A at [facility]: [address] [place], [place] [address] and check in at Admitting at 10:00 AM  Advised of plan to go home the same day and will only stay overnight if medically necessary. You MUST have a responsible adult to drive you home and MUST be with you the first 24 hours after you arrive home or your procedure could be cancelled.  Patient verbalized understanding to all instructions provided and agreed to proceed with procedure.

## 2023-08-23 ENCOUNTER — Ambulatory Visit: Payer: Self-pay | Admitting: Cardiology

## 2023-08-23 LAB — CBC
Hematocrit: 55.7 % — ABNORMAL HIGH (ref 37.5–51.0)
Hemoglobin: 18.4 g/dL — ABNORMAL HIGH (ref 13.0–17.7)
MCH: 30.5 pg (ref 26.6–33.0)
MCHC: 33 g/dL (ref 31.5–35.7)
MCV: 92 fL (ref 79–97)
Platelets: 327 10*3/uL (ref 150–450)
RBC: 6.04 x10E6/uL — ABNORMAL HIGH (ref 4.14–5.80)
RDW: 15.1 % (ref 11.6–15.4)
WBC: 10.2 10*3/uL (ref 3.4–10.8)

## 2023-08-23 LAB — BASIC METABOLIC PANEL WITH GFR
BUN/Creatinine Ratio: 15 (ref 9–20)
BUN: 19 mg/dL (ref 6–24)
CO2: 23 mmol/L (ref 20–29)
Calcium: 10.9 mg/dL — ABNORMAL HIGH (ref 8.7–10.2)
Chloride: 102 mmol/L (ref 96–106)
Creatinine, Ser: 1.28 mg/dL — ABNORMAL HIGH (ref 0.76–1.27)
Glucose: 141 mg/dL — ABNORMAL HIGH (ref 70–99)
Potassium: 4.3 mmol/L (ref 3.5–5.2)
Sodium: 141 mmol/L (ref 134–144)
eGFR: 68 mL/min/{1.73_m2} (ref >59)

## 2023-08-25 ENCOUNTER — Other Ambulatory Visit (HOSPITAL_COMMUNITY): Payer: Self-pay

## 2023-08-25 NOTE — Pre-Procedure Instructions (Signed)
 Attempted to call patient regarding procedure instructions.  Unable to leave voicemail mailbox full.    Below are the instructions. .  Arrival time 1000 Nothing to eat or drink after midnight No meds AM of procedure Responsible person to drive you home and stay with you for 24 hrs  Don't take Ozempic  for 7 days prior to procedure

## 2023-08-26 ENCOUNTER — Other Ambulatory Visit (HOSPITAL_BASED_OUTPATIENT_CLINIC_OR_DEPARTMENT_OTHER): Payer: Self-pay | Admitting: Cardiology

## 2023-08-26 DIAGNOSIS — I10 Essential (primary) hypertension: Secondary | ICD-10-CM

## 2023-08-28 ENCOUNTER — Encounter (HOSPITAL_COMMUNITY)
Admission: RE | Disposition: A | Discharge status: Home / Self Care | Payer: Self-pay | Source: Home / Self Care | Attending: Cardiology

## 2023-08-28 ENCOUNTER — Other Ambulatory Visit: Payer: Self-pay

## 2023-08-28 ENCOUNTER — Ambulatory Visit (HOSPITAL_BASED_OUTPATIENT_CLINIC_OR_DEPARTMENT_OTHER): Payer: Self-pay

## 2023-08-28 ENCOUNTER — Encounter (HOSPITAL_COMMUNITY): Payer: Self-pay | Admitting: Cardiology

## 2023-08-28 ENCOUNTER — Ambulatory Visit (HOSPITAL_COMMUNITY)
Admission: RE | Admit: 2023-08-28 | Discharge: 2023-08-28 | Disposition: A | Discharge status: Home / Self Care | Attending: Cardiology | Admitting: Cardiology

## 2023-08-28 ENCOUNTER — Ambulatory Visit (HOSPITAL_COMMUNITY): Payer: Self-pay

## 2023-08-28 DIAGNOSIS — J449 Chronic obstructive pulmonary disease, unspecified: Secondary | ICD-10-CM | POA: Diagnosis not present

## 2023-08-28 DIAGNOSIS — I10 Essential (primary) hypertension: Secondary | ICD-10-CM

## 2023-08-28 DIAGNOSIS — I471 Supraventricular tachycardia, unspecified: Secondary | ICD-10-CM

## 2023-08-28 DIAGNOSIS — I4719 Other supraventricular tachycardia: Secondary | ICD-10-CM | POA: Insufficient documentation

## 2023-08-28 DIAGNOSIS — Z87891 Personal history of nicotine dependence: Secondary | ICD-10-CM

## 2023-08-28 DIAGNOSIS — I4891 Unspecified atrial fibrillation: Secondary | ICD-10-CM | POA: Insufficient documentation

## 2023-08-28 HISTORY — PX: SVT ABLATION: EP1225

## 2023-08-28 LAB — GLUCOSE, CAPILLARY
Glucose-Capillary: 144 mg/dL — ABNORMAL HIGH (ref 70–99)
Glucose-Capillary: 138 mg/dL — ABNORMAL HIGH (ref 70–99)

## 2023-08-28 SURGERY — SVT ABLATION
Anesthesia: Monitor Anesthesia Care

## 2023-08-28 MED ORDER — PHENYLEPHRINE 80 MCG/ML (10ML) SYRINGE FOR IV PUSH (FOR BLOOD PRESSURE SUPPORT)
PREFILLED_SYRINGE | INTRAVENOUS | Status: DC | PRN
  Administered 2023-08-28: 80 ug via INTRAVENOUS

## 2023-08-28 MED ORDER — PHENYLEPHRINE HCL-NACL 20-0.9 MG/250ML-% IV SOLN
INTRAVENOUS | Status: DC | PRN
Start: 2023-08-28 — End: 2023-08-28
  Administered 2023-08-28: 35 ug/min via INTRAVENOUS

## 2023-08-28 MED ORDER — LIDOCAINE HCL (PF) 1 % IJ SOLN
INTRAMUSCULAR | Status: AC
  Filled 2023-08-28: qty 60

## 2023-08-28 MED ORDER — MIDAZOLAM HCL 2 MG/2ML IJ SOLN
INTRAMUSCULAR | Status: DC | PRN
  Administered 2023-08-28: 2 mg via INTRAVENOUS

## 2023-08-28 MED ORDER — SODIUM CHLORIDE 0.9 % IV SOLN: INTRAVENOUS | Status: DC

## 2023-08-28 MED ORDER — SODIUM CHLORIDE 0.9% FLUSH: 3.0000 mL | INTRAVENOUS | Status: DC | PRN

## 2023-08-28 MED ORDER — ONDANSETRON HCL 4 MG/2ML IJ SOLN
4.0000 mg | Freq: Four times a day (QID) | INTRAMUSCULAR | Status: DC | PRN
Start: 2023-08-28 — End: 2023-08-28

## 2023-08-28 MED ORDER — PROPOFOL 500 MG/50ML IV EMUL
INTRAVENOUS | Status: DC | PRN
  Administered 2023-08-28: 125 ug/kg/min via INTRAVENOUS

## 2023-08-28 MED ORDER — DEXAMETHASONE SODIUM PHOSPHATE 10 MG/ML IJ SOLN
INTRAMUSCULAR | Status: DC | PRN
  Administered 2023-08-28: 5 mg via INTRAVENOUS

## 2023-08-28 MED ORDER — ONDANSETRON HCL 4 MG/2ML IJ SOLN
INTRAMUSCULAR | Status: DC | PRN
  Administered 2023-08-28: 4 mg via INTRAVENOUS

## 2023-08-28 MED ORDER — SODIUM CHLORIDE 0.9 % IV SOLN: 250.0000 mL | INTRAVENOUS | Status: DC | PRN

## 2023-08-28 MED ORDER — HEPARIN (PORCINE) IN NACL 1000-0.9 UT/500ML-% IV SOLN
INTRAVENOUS | Status: DC | PRN
  Administered 2023-08-28 (×2): 500 mL

## 2023-08-28 MED ORDER — ACETAMINOPHEN 325 MG PO TABS: 650.0000 mg | ORAL_TABLET | ORAL | Status: DC | PRN

## 2023-08-28 MED ORDER — FENTANYL CITRATE (PF) 100 MCG/2ML IJ SOLN
INTRAMUSCULAR | Status: AC
  Filled 2023-08-28: qty 2

## 2023-08-28 MED ORDER — HEPARIN SODIUM (PORCINE) 1000 UNIT/ML IJ SOLN
INTRAMUSCULAR | Status: DC | PRN
  Administered 2023-08-28: 1000 [IU] via INTRAVENOUS

## 2023-08-28 MED ORDER — ISOPROTERENOL HCL 0.2 MG/ML IJ SOLN
INTRAMUSCULAR | Status: AC
  Filled 2023-08-28: qty 5

## 2023-08-28 MED ORDER — ISOPROTERENOL HCL 0.2 MG/ML IJ SOLN
INTRAVENOUS | Status: DC | PRN
  Administered 2023-08-28: 2 ug/min via INTRAVENOUS

## 2023-08-28 MED ORDER — MIDAZOLAM HCL 2 MG/2ML IJ SOLN
INTRAMUSCULAR | Status: AC
Start: 2023-08-28 — End: ?
  Filled 2023-08-28: qty 2

## 2023-08-28 MED ORDER — PROPOFOL 10 MG/ML IV BOLUS
INTRAVENOUS | Status: DC | PRN
  Administered 2023-08-28: 20 mg via INTRAVENOUS
  Administered 2023-08-28 (×2): 30 mg via INTRAVENOUS
  Administered 2023-08-28 (×2): 50 mg via INTRAVENOUS

## 2023-08-28 MED ORDER — ACETAMINOPHEN 500 MG PO TABS
1000.0000 mg | ORAL_TABLET | Freq: Once | ORAL | Status: AC
  Administered 2023-08-28: 1000 mg via ORAL

## 2023-08-28 MED ORDER — HEPARIN SODIUM (PORCINE) 1000 UNIT/ML IJ SOLN
INTRAMUSCULAR | Status: DC | PRN
  Administered 2023-08-28: 5000 [IU] via INTRAVENOUS

## 2023-08-28 MED ORDER — HEPARIN SODIUM (PORCINE) 1000 UNIT/ML IJ SOLN
INTRAMUSCULAR | Status: AC
  Filled 2023-08-28: qty 10

## 2023-08-28 MED ORDER — ACETAMINOPHEN 500 MG PO TABS
ORAL_TABLET | ORAL | Status: AC
  Filled 2023-08-28: qty 2

## 2023-08-28 MED ORDER — FENTANYL CITRATE (PF) 100 MCG/2ML IJ SOLN
INTRAMUSCULAR | Status: DC | PRN
  Administered 2023-08-28: 100 ug via INTRAVENOUS

## 2023-08-28 MED ORDER — SODIUM CHLORIDE 0.9% FLUSH: 3.0000 mL | Freq: Two times a day (BID) | INTRAVENOUS | Status: DC

## 2023-08-28 SURGICAL SUPPLY — 16 items
BAG SNAP BAND KOVER 36X36 (MISCELLANEOUS) IMPLANT
CATH ABLAT QDOT MICRO BI TC DF (CATHETERS) IMPLANT
CATH BI DIR 7FR CS F-J 12 PIN (CATHETERS) IMPLANT
CATH CRD2 QUAD 6FR REP (CATHETERS) IMPLANT
CATH JOSEPH QUAD ALLRED 6F REP (CATHETERS) IMPLANT
CATH OCTARAY 2.0 F 3-3-3-3-3 (CATHETERS) IMPLANT
CLOSURE PERCLOSE PROSTYLE (VASCULAR PRODUCTS) IMPLANT
DEVICE CLOSURE MYNXGRIP 6/7F (Vascular Products) IMPLANT
MAT PREVALON FULL STRYKER (MISCELLANEOUS) IMPLANT
PACK EP LF (CUSTOM PROCEDURE TRAY) ×1 IMPLANT
PAD DEFIB RADIO PHYSIO CONN (PAD) ×1 IMPLANT
PATCH CARTO3 (PAD) IMPLANT
SHEATH AVANTI 11CM 9FR (SHEATH) IMPLANT
SHEATH PINNACLE 8F 10CM (SHEATH) IMPLANT
SHEATH PROBE COVER 6X72 (BAG) IMPLANT
TUBING SMART ABLATE COOLFLOW (TUBING) IMPLANT

## 2023-08-28 NOTE — Anesthesia Preprocedure Evaluation (Addendum)
 Anesthesia Evaluation  Patient identified by MRN, date of birth, ID band Patient awake    Reviewed: Allergy & Precautions, NPO status , Patient's Chart, lab work & pertinent test results, reviewed documented beta blocker date and time   History of Anesthesia Complications Negative for: history of anesthetic complications  Airway Mallampati: II  TM Distance: >3 FB Neck ROM: Full    Dental  (+) Dental Advisory Given   Pulmonary asthma , sleep apnea (no longer requires CPAP) , COPD,  COPD inhaler, former smoker   breath sounds clear to auscultation       Cardiovascular hypertension, Pt. on medications and Pt. on home beta blockers (-) angina  Rhythm:Regular Rate:Normal  '24 ECHO: EF 60 to 65%.  1. The LV has normal function, no regional wall motion abnormalities. Left ventricular diastolic parameters were normal.   2. RVF is normal. The right ventricular size is normal. Tricuspid regurgitation signal is inadequate for assessing PA pressure.   3. The mitral valve is normal in structure. Trivial mitral valve regurgitation. No evidence of mitral stenosis.   4. The aortic valve is tricuspid. Aortic valve regurgitation is not visualized. No aortic stenosis is present.     Neuro/Psych   Anxiety        GI/Hepatic Neg liver ROS, hiatal hernia, PUD,GERD  Medicated and Controlled,,  Endo/Other  diabetes (glu 144), Oral Hypoglycemic Agents  BMI 31 Ozempic : none x 2 weeks  Renal/GU negative Renal ROS     Musculoskeletal   Abdominal   Peds  Hematology   Anesthesia Other Findings   Reproductive/Obstetrics                              Anesthesia Physical Anesthesia Plan  ASA: 3  Anesthesia Plan: MAC   Post-op Pain Management: Tylenol  PO (pre-op )*   Induction:   PONV Risk Score and Plan: 1 and Ondansetron   Airway Management Planned: Natural Airway and Simple Face Mask  Additional Equipment:  None  Intra-op Plan:   Post-operative Plan: Extubation in OR  Informed Consent: I have reviewed the patients History and Physical, chart, labs and discussed the procedure including the risks, benefits and alternatives for the proposed anesthesia with the patient or authorized representative who has indicated his/her understanding and acceptance.     Dental advisory given  Plan Discussed with: CRNA and Surgeon  Anesthesia Plan Comments:          Anesthesia Quick Evaluation

## 2023-08-28 NOTE — Progress Notes (Signed)
 Report given to [facility]

## 2023-08-28 NOTE — H&P (Signed)
 Electrophysiology Note:   Date:  08/28/23  ID:  Vermell Goes, DOB 12-04-1970, MRN [id number]   Primary Cardiologist: Sheryle Donning, MD Electrophysiologist: Ardeen Kohler, MD       History of Present Illness:   HARIS BAACK is a 53 y.o. male with h/o non-obstructive CAD, palpitations, PSVT, hypertension, hyperlipidemia, type 2 diabetes, asthma, anxiety, OSA, ADHD, and GERD who is being seen today for evaluation of his SVT at the request of Dr. Veryl Gottron.    Discussed the use of AI scribe software for clinical note transcription with the patient, who gave verbal consent to proceed.   History of Present Illness He has been experiencing palpitations and a sensation of his heart racing, which began two years ago after a COVID-19 infection. The sensation is described as something moving from his heart to his throat, occurring daily if he does not take his medication. The palpitations are triggered by physical activity, such as getting up from a chair or climbing stairs, and are exacerbated by caffeine and sugar intake.   He is on medication for these symptoms, taking 37.5 mg twice a day, which helps reduce the frequency but does not completely eliminate the episodes. The palpitations can occur in the morning and are more frequent if he misses a dose.   There is a family history of heart rhythm issues, with his twin brother having had atrial fibrillation and his sister having undergone an ablation for supraventricular tachycardia. His parents did not have similar issues.   He has lost 30 pounds recently and reports that he no longer has sleep apnea, which was previously mild and managed with a CPAP machine. His wife has noted that he no longer snores. No current use of a CPAP machine.    Interval: Patient presents today for scheduled ablation. Reports doing relatively well. Continues to have short episodes. No new or acute complaints.  Review of systems complete and found to be  negative unless listed in HPI.    EP Information / Studies Reviewed:     EKG is not ordered today. EKG from 06/20/23 reviewed which showed sinus rhythm with PACs.        Echo 07/19/22:  1. Left ventricular ejection fraction, by estimation, is 60 to 65%. The  left ventricle has normal function. The left ventricle has no regional  wall motion abnormalities. Left ventricular diastolic parameters were  normal.   2. Right ventricular systolic function is normal. The right ventricular  size is normal. Tricuspid regurgitation signal is inadequate for assessing  PA pressure.   3. The mitral valve is normal in structure. Trivial mitral valve  regurgitation. No evidence of mitral stenosis.   4. The aortic valve is tricuspid. Aortic valve regurgitation is not  visualized. No aortic stenosis is present.   5. Aortic dilatation noted. There is borderline dilatation of the  ascending aorta, measuring 38 mm.    Coronary CTA 07/01/22:  IMPRESSION: 1. Coronary calcium  score of 71. This was 53 percentile for age and sex matched control.   2. Normal coronary origin with right dominance.   3. CAD-RADS 2. Mild non-obstructive CAD (25-49%). Consider non-atherosclerotic causes of chest pain. Consider preventive therapy and risk factor modification. Additional analysis with CT FFR will be submitted for soft plaque in the distal LCX. -CT FFR analysis showed no significant stenosis.    Zio 05/2022:  Patch Wear Time:  13 days and 21 hours    [name], max  HR of 218 bpm, [name]. Predominant underlying rhythm was Sinus Rhythm. 1 run of Ventricular Tachycardia occurred lasting 4 beats with a max rate of 133 bpm (avg 128 bpm). 1684 Supraventricular Tachycardia runs occurred, the run with the fastest interval lasting 13.0 secs with a max rate of 218 bpm, the longest lasting 2 mins 12 secs with an avg rate of 139 bpm. Isolated SVEs were occasional (1.7%), ventricular ectopy was  rare (<1%). There were 44 triggered events, which were typically associated with SVT.      Physical Exam:    Today's Vitals   08/28/23 0948  BP: (!) 133/93  Pulse: 70  Resp: 18  Temp: 97.9 F (36.6 C)  TempSrc: Oral  SpO2: 97%  Weight: 104.3 kg  Height: 6' (1.829 m)   Body mass index is 31.19 kg/m.   GEN: Well nourished, well developed in no acute distress NECK: No JVD CARDIAC: Normal rate, regular rhythm RESPIRATORY:  Clear to auscultation without rales, wheezing or rhonchi  ABDOMEN: Soft, non-distended EXTREMITIES:  No edema; No deformity    ASSESSMENT AND PLAN:     # Paroxysmal SVT: Zio monitor looks more consistent with bursts of atrial tachycardia. There are not long stretches of narrow, regular tachycardia which would be more consistent with AVNRT or AVRT. Given his risk factors, my suspicion is for PV atrial tach/early AF. # Palpitations:  - Continue metoprolol  37.5mg  BID for now.  - Risk, benefits, and alternatives to EP study and radiofrequency ablation for SVT were also discussed in detail today. These risks include but are not limited to complete heart block, stroke, bleeding, vascular damage, tamponade, perforation, and death. The patient understands these risk and wishes to proceed today.    Signed, Ardeen Kohler, MD

## 2023-08-28 NOTE — Transfer of Care (Signed)
 Immediate Anesthesia Transfer of Care Note  Patient: Michael Jacobs  Procedure(s) Performed: SVT ABLATION  Patient Location: Cath Lab  Anesthesia Type:MAC  Level of Consciousness: awake, alert , and oriented  Airway & Oxygen Therapy: Patient Spontanous Breathing  Post-op Assessment: Report given to RN and Post -op Vital signs reviewed and stable  Post vital signs: Reviewed and stable  Last Vitals:  Vitals Value Taken Time  BP    Temp    Pulse    Resp    SpO2      Last Pain:  Vitals:   08/28/23 0948  TempSrc: Oral         Complications: There were no known notable events for this encounter.

## 2023-08-28 NOTE — Discharge Instructions (Signed)

## 2023-08-28 NOTE — Anesthesia Postprocedure Evaluation (Signed)
 Anesthesia Post Note  Patient: Michael Jacobs  Procedure(s) Performed: SVT ABLATION     Patient location during evaluation: Cath Lab Anesthesia Type: MAC Level of consciousness: awake and alert, patient cooperative and oriented Pain management: pain level controlled Vital Signs Assessment: post-procedure vital signs reviewed and stable Respiratory status: nonlabored ventilation, spontaneous breathing and respiratory function stable Cardiovascular status: blood pressure returned to baseline and stable Postop Assessment: no apparent nausea or vomiting Anesthetic complications: no   There were no known notable events for this encounter.  Last Vitals:  Vitals:   08/28/23 0948 08/28/23 1508  BP: (!) 133/93 117/85  Pulse: 70 (!) 103  Resp: 18 10  Temp: 36.6 C 36.6 C  SpO2: 97% 93%    Last Pain:  Vitals:   08/28/23 1508  TempSrc: Temporal  PainSc: 0-No pain                 Angad Nabers,E. Coe Angelos

## 2023-08-29 ENCOUNTER — Telehealth (HOSPITAL_COMMUNITY): Payer: Self-pay

## 2023-08-29 MED FILL — Lidocaine HCl Local Preservative Free (PF) Inj 1%: INTRAMUSCULAR | Qty: 30 | Status: AC

## 2023-08-29 NOTE — Telephone Encounter (Signed)
 Spoke with patient to complete post procedure follow up call.  Patient reports no complications with groin sites.   Instructions reviewed with patient:  Remove large bandage at puncture site after 24 hours. It is normal to have bruising, tenderness and a pea or marble sized lump/knot at the groin site which can take up to three months to resolve.  Get help right away if you notice sudden swelling at the puncture site.  Check your puncture site every day for signs of infection: fever, redness, swelling, pus drainage, warmth, foul odor or excessive pain. If this occurs, please call the office at [phone], to speak with the nurse. Get help right away if your puncture site is bleeding and the bleeding does not stop after applying firm pressure to the area.  You may continue to have skipped beats during the first several months after your procedure.   You will follow up with the APP on 09/26/23.   Patient verbalized understanding to all instructions provided.

## 2023-08-30 ENCOUNTER — Ambulatory Visit (INDEPENDENT_AMBULATORY_CARE_PROVIDER_SITE_OTHER): Admitting: Family Medicine

## 2023-08-30 ENCOUNTER — Ambulatory Visit: Payer: Self-pay

## 2023-08-30 ENCOUNTER — Encounter (HOSPITAL_COMMUNITY): Payer: Self-pay | Admitting: Cardiology

## 2023-08-30 VITALS — BP 154/106 | HR 89 | Temp 97.9°F | Wt 230.3 lb

## 2023-08-30 DIAGNOSIS — K602 Anal fissure, unspecified: Secondary | ICD-10-CM

## 2023-08-30 DIAGNOSIS — K625 Hemorrhage of anus and rectum: Secondary | ICD-10-CM | POA: Diagnosis not present

## 2023-08-30 DIAGNOSIS — I10 Essential (primary) hypertension: Secondary | ICD-10-CM | POA: Diagnosis not present

## 2023-08-30 DIAGNOSIS — M79675 Pain in left toe(s): Secondary | ICD-10-CM | POA: Diagnosis not present

## 2023-08-30 MED ORDER — DILTIAZEM GEL 2 %: 1.0000 | Freq: Three times a day (TID) | CUTANEOUS | 1 refills | Status: DC

## 2023-08-30 NOTE — Telephone Encounter (Signed)
 Chief Complaint: Rectal pain, bleeding Symptoms: bloody stool 2 weeks ago, this past weekend, blood on tissue today, pain 5/10 Frequency: Onset 2 weeks ago, then again on Saturday and this morning Pertinent Negatives: Patient denies other symptoms Disposition: [] ED /[] Urgent Care (no appt availability in office) / [x] Appointment(In office/virtual)/ []  [facility]/ [] Home Care/ [] Refused Recommended Disposition /[] [facility]/ []  Follow-up with PCP Additional Notes: Patient says 2 weeks ago with a BM he had rectal pain and a bloody stool, no more after that. He had a BM this past weekend on Saturday and same pain with more blood in the stool. On today he had a BM that was softer and he saw blood on the tissue as well has having rectal pain. He asks if he could come in today. Advised no availability with PCP until 09/08/23, scheduled today with Dr. Darren Em.   Copied from CRM [id number]. Topic: Clinical - Red Word Triage >> Aug 30, 2023 11:22 AM Bambi Bonine D wrote: Red Word that prompted transfer to Nurse Triage: Pain and bleeding in rectum  Pt stated that he is currently experiencing pain and bleeding in his rectum. Reason for Disposition  MODERATE-SEVERE rectal pain (i.e., interferes with school, work, or sleep)  Answer Assessment - Initial Assessment Questions 1. SYMPTOM:  "What's the main symptom you're concerned about?" (e.g., pain, itching, swelling, rash)     Pain 2. ONSET: "When did the pain start?"     A couple weeks ago, went away, this weekend it hurt more 3. RECTAL PAIN: "Do you have any pain around your rectum?" "How bad is the pain?"  (Scale 0-10; or mild, moderate, severe)   - NONE (0): no pain   - MILD (1-3): doesn't interfere with normal activities    - MODERATE (4-7): interferes with normal activities or awakens from sleep, limping    - SEVERE (8-10): excruciating pain, unable to have a bowel movement      5 4. RECTAL ITCHING: "Do you have any itching in  this area?" "How bad is the itching?"  (Scale 0-10; or mild, moderate, severe)   - NONE: no itching   - MILD: doesn't interfere with normal activities    - MODERATE-SEVERE: interferes with normal activities or awakens from sleep     No 5. CONSTIPATION: "Do you have constipation?" If Yes, ask: "How bad is it?"     Not this time 6. CAUSE: "What do you think is causing the anus symptoms?"     Fissure, hemorrhoids 7. OTHER SYMPTOMS: "Do you have any other symptoms?"  (e.g., abdomen pain, fever, rectal bleeding, vomiting)     Bloody stool x 3  Protocols used: Rectal Symptoms-A-AH

## 2023-08-30 NOTE — Telephone Encounter (Signed)
 Patient was scheduled for an appt with Dr Darren Em today.

## 2023-08-30 NOTE — Progress Notes (Signed)
 Established Patient Office Visit  Subjective   Patient ID: Michael Jacobs, male    DOB: [birth year]  Age: 53 y.o. MRN: [id number]  Chief Complaint  Patient presents with   Rectal Pain    Patient complains of rectal pain, x2 weeks    Rectal Bleeding    Patient complains of rectal bleeding, x2 weeks     HPI   Michael Jacobs is seen for the following items  Recent perianal pain and bright red blood per rectum.  He was recently at the beach and noticed some pain and bright red blood per rectum with wiping.  First noted blood about 2 weeks ago.  Known history of anal fissure.  Last colonoscopy 12/20 with recommended 5-year follow-up.  History of colon polyps.  Denies any recent appetite or weight changes.  He is trying to lose some weight.  Occasional hard stools.    Second issue is left fourth toe pain.  Last year he was working on his mower and leaning over and when leaning forward felt a pop sensation involving the left fourth toe.  Has had some persistent somewhat poorly localized left fourth toe pain since then.  Never had any x-rays.  Would like to have this further evaluated perhaps with podiatry.  Blood pressure is up today and he is on several blood pressure medications.  Has been compliant with those.  Today's blood pressure is somewhat atypical for him  Past Medical History:  Diagnosis Date   Anxiety    Asthma    Cecal ulcer    Diabetes mellitus (HCC) 2017   GERD (gastroesophageal reflux disease)    Hiatal hernia    Hypercholesterolemia    Hypertension    Past Surgical History:  Procedure Laterality Date   COLONOSCOPY  2015   EYE SURGERY     04/2020- Retina repair bilateral eyes by Dr. Augustus Ledger per patient    lasik Bilateral    POLYPECTOMY     SVT ABLATION N/A 08/28/2023   Procedure: SVT ABLATION;  Surgeon: Ardeen Kohler, MD;  [facility];  Service: Cardiovascular;  Laterality: N/A;   TONSILLECTOMY AND ADENOIDECTOMY     UPPER GASTROINTESTINAL ENDOSCOPY      WISDOM TOOTH EXTRACTION      reports that he has quit smoking. His smoking use included cigarettes and cigars. He has a 0.8 pack-year smoking history. He has never used smokeless tobacco. He reports current alcohol use of about 3.0 standard drinks of alcohol per week. He reports that he does not use drugs. family history includes Aneurysm in his father; Arthritis in his maternal grandmother; Breast cancer in his paternal grandmother; Colon cancer in his maternal grandmother; Colon polyps in his maternal grandmother and mother; Diabetes in his maternal grandmother; Heart disease in his maternal grandmother; High Cholesterol in his mother; High blood pressure in his maternal grandfather and mother; Lung cancer in his paternal grandmother; Obesity in his brother and sister; Prostate cancer in his maternal grandfather; Stomach cancer in his paternal grandmother. Allergies  Allergen Reactions   Ace Inhibitors Cough   Phenergan  [Promethazine  Hcl] Anxiety    Review of Systems  Constitutional:  Negative for malaise/fatigue.  Eyes:  Negative for blurred vision.  Respiratory:  Negative for shortness of breath.   Cardiovascular:  Negative for chest pain.  Gastrointestinal:  Negative for melena.  Neurological:  Negative for dizziness, weakness and headaches.      Objective:     BP (!) 154/106 (BP Location: Left Arm, Patient  Position: Sitting, Cuff Size: Normal)   Pulse 89   Temp 97.9 F (36.6 C) (Oral)   Wt 230 lb 4.8 oz (104.5 kg)   SpO2 96%   BMI 31.23 kg/m  BP Readings from Last 3 Encounters:  08/30/23 (!) 154/106  08/28/23 (!) 152/97  07/24/23 (!) 140/99   Wt Readings from Last 3 Encounters:  08/30/23 230 lb 4.8 oz (104.5 kg)  08/28/23 230 lb (104.3 kg)  07/24/23 230 lb (104.3 kg)      Physical Exam Vitals reviewed.  Constitutional:      Appearance: Normal appearance.  Cardiovascular:     Rate and Rhythm: Normal rate and regular rhythm.  Pulmonary:     Effort: Pulmonary  effort is normal.     Breath sounds: Normal breath sounds. No wheezing or rales.  Genitourinary:    Comments: Anal exam reveals fairly large anal fissure around the 11 o'clock position.  No active bleeding.  Tender to palpation.  No evidence for perianal abscess. Musculoskeletal:     Comments: Left fourth toe reveals no warmth or erythema.  No ecchymosis.  No localized bony tenderness.  No web space tenderness.  Neurological:     Mental Status: He is alert.      No results found for any visits on 08/30/23.  Last CBC Lab Results  Component Value Date   WBC 10.2 08/23/2023   HGB 18.4 (H) 08/23/2023   HCT 55.7 (H) 08/23/2023   MCV 92 08/23/2023   MCH 30.5 08/23/2023   RDW 15.1 08/23/2023   PLT 327 08/23/2023   Last metabolic panel Lab Results  Component Value Date   GLUCOSE 141 (H) 08/23/2023   NA 141 08/23/2023   K 4.3 08/23/2023   CL 102 08/23/2023   CO2 23 08/23/2023   BUN 19 08/23/2023   CREATININE 1.28 (H) 08/23/2023   EGFR 68 08/23/2023   CALCIUM  10.9 (H) 08/23/2023   PROT 7.3 12/22/2022   ALBUMIN 4.3 12/22/2022   BILITOT 1.0 12/22/2022   ALKPHOS 56 12/22/2022   AST 17 12/22/2022   ALT 29 12/22/2022   Last lipids Lab Results  Component Value Date   CHOL 104 12/22/2022   HDL 36.00 (L) 12/22/2022   LDLCALC 25 12/22/2022   LDLDIRECT 130.0 03/29/2022   TRIG 216.0 (H) 12/22/2022   CHOLHDL 3 12/22/2022   Last hemoglobin A1c Lab Results  Component Value Date   HGBA1C 6.8 (H) 12/22/2022      The ASCVD Risk score (Arnett DK, et al., 2019) failed to calculate for the following reasons:   The valid total cholesterol range is 130 to 320 mg/dL    Assessment & Plan:   #1 anal fissure with significant associated pain.  Discussed measures to reduce constipation with stool softener, plenty of fluids, fiber.  He is having significant amount of pain and we suggested diltiazem  2% gel to use 3-4 times daily as needed.  Patient aware to set up follow-up colonoscopy  within the next 6 months  #2 persistent left fourth toe pain following injury last summer.  No visible swelling.  No localized bony tenderness.  It sounds like he may have had a hyperextension type injury at the time that he first noted pain last summer as he was leaning forward.  Set up podiatry referral for further assessment  #3  Hypertension- generally well controlled, but up today.  On several BP medications.  Monitor closely and be in touch with Dr Bambi Lever if remaining up next several days.  No follow-ups on file.    Glean Lamy, MD

## 2023-08-30 NOTE — Patient Instructions (Signed)
 Consider trial of OTC Voltaren gel to left fourth toe  Stay well hydrated and get at least 30 grams of fiber per day  Consider stool softener such as Colace 100 mg two daily to reduce straining.

## 2023-08-31 ENCOUNTER — Telehealth: Payer: Self-pay | Admitting: Family Medicine

## 2023-08-31 ENCOUNTER — Other Ambulatory Visit: Payer: Self-pay | Admitting: Family Medicine

## 2023-08-31 DIAGNOSIS — Z1211 Encounter for screening for malignant neoplasm of colon: Secondary | ICD-10-CM

## 2023-08-31 DIAGNOSIS — E1165 Type 2 diabetes mellitus with hyperglycemia: Secondary | ICD-10-CM

## 2023-08-31 DIAGNOSIS — F5104 Psychophysiologic insomnia: Secondary | ICD-10-CM

## 2023-08-31 DIAGNOSIS — N529 Male erectile dysfunction, unspecified: Secondary | ICD-10-CM

## 2023-08-31 MED ORDER — OZEMPIC (1 MG/DOSE) 4 MG/3ML ~~LOC~~ SOPN: PEN_INJECTOR | SUBCUTANEOUS | 0 refills | Status: DC

## 2023-08-31 MED ORDER — TRAZODONE HCL 50 MG PO TABS: 25.0000 mg | ORAL_TABLET | Freq: Every evening | ORAL | 0 refills | Status: DC | PRN

## 2023-08-31 MED ORDER — SILDENAFIL CITRATE 20 MG PO TABS
ORAL_TABLET | ORAL | 0 refills | Status: AC
Start: 2023-08-31 — End: ?

## 2023-08-31 NOTE — Telephone Encounter (Signed)
 Copied from CRM [id number]. Topic: Clinical - Medication Refill >> Aug 31, 2023  9:53 AM Vivian Z wrote: Medication: Semaglutide , 1 MG/DOSE, (OZEMPIC , 1 MG/DOSE,) 4 MG/3ML SOPN Patient would like 3 month supply.  Has the patient contacted their pharmacy? Yes (Agent: If no, request that the patient contact the pharmacy for the refill. If patient does not wish to contact the pharmacy document the reason why and proceed with request.) (Agent: If yes, when and what did the pharmacy advise?) No refills available.  This is the patient's preferred pharmacy:  [facility] [id number] - [place], [place] - [address] [place] [address] Phone: [phone] Fax: [phone]  Is this the correct pharmacy for this prescription? Yes If no, delete pharmacy and type the correct one.   Has the prescription been filled recently? No  Is the patient out of the medication? Yes  Has the patient been seen for an appointment in the last year OR does the patient have an upcoming appointment? Yes  Can we respond through MyChart? Yes  Agent: Please be advised that Rx refills may take up to 3 business days. We ask that you follow-up with your pharmacy.

## 2023-08-31 NOTE — Telephone Encounter (Signed)
 Spoke with the patient, informed him the referral was placed as below and someone will contact him with appt info.

## 2023-08-31 NOTE — Addendum Note (Signed)
 Addended by: Henry Loge on: 08/31/2023 01:35 PM   Modules accepted: Orders

## 2023-08-31 NOTE — Telephone Encounter (Signed)
 Rx for the compounded Rx was faxed by [facility] on 5/21.   Rx sent for Sildenafil  and forwarded to PCP for Trazodone .

## 2023-08-31 NOTE — Telephone Encounter (Signed)
 Rx done.

## 2023-08-31 NOTE — Telephone Encounter (Signed)
 Copied from CRM [id number]. Topic: Clinical - Prescription Issue >> Aug 31, 2023 12:59 PM Martinique E wrote: Reason for CRM: Patient called in regarding his Semaglutide , 1 MG/DOSE, (OZEMPIC , 1 MG/DOSE,) 4 MG/3ML SOPN medication. Stated his insurance will not cover this medication for a 1 month supply, needs a 3 month supply in order for insurance to cover. Patient stated that 1 month supply was sent in instead. Patient does have appt on June 2nd to discuss medication refills, but needing that 3 month supply. Callback number for patient is [phone].

## 2023-08-31 NOTE — Telephone Encounter (Signed)
 Pt  has an appt on 09-11-2023 and pt seen dr burchette yesterday and diltiazem  2 % GEL  was printed and not sent to  [facility] - [place], [place] - [address] Phone: [phone]  Fax: [phone]    Pt also need refill on  sildenafil  (REVATIO ) 20 MG tablet  and traZODone  (DESYREL ) 50 MG tablet please sent to  [facility] [id number] [place], [place] - [address] Phone: [phone]  Fax: [phone]

## 2023-08-31 NOTE — Telephone Encounter (Signed)
 Copied from CRM [id number]. Topic: Referral - Request for Referral >> Aug 31, 2023  9:38 AM Magdalene School wrote: Did the patient discuss referral with their provider in the last year? No (If No - schedule appointment) Patient has an appointment scheduled but would like to check if it's possible to get referral prior to visit. (If Yes - send message)  Appointment offered? No  Type of order/referral and detailed reason for visit:  Patient needs a gastroenterology referral because his current one is retiring and to schedule an appointment he needs a referral.  Preference of office, provider, location:   [facility]: Alfornia Imam MD [address], [place], [place] [address] Fax: [phone]  If referral order, have you been seen by this specialty before? Yes (If Yes, this issue or another issue? When? Where? Same issue.  Can we respond through MyChart? Yes

## 2023-08-31 NOTE — Telephone Encounter (Signed)
 Yes ok to refer to GI for his screening colonoscopy-- pt still needs to see me in the office for his follow up

## 2023-09-01 ENCOUNTER — Telehealth: Payer: Self-pay

## 2023-09-01 NOTE — Telephone Encounter (Signed)
 Copied from CRM [id number]. Topic: Referral - Status >> Sep 01, 2023  2:55 PM Michael Jacobs wrote: Reason for CRM: [facility] advise Dr. Tova Fresh is unable to see the pt.

## 2023-09-02 NOTE — Telephone Encounter (Signed)
 Ok is there someone else we can send him to?

## 2023-09-11 ENCOUNTER — Ambulatory Visit (INDEPENDENT_AMBULATORY_CARE_PROVIDER_SITE_OTHER): Admitting: Family Medicine

## 2023-09-11 ENCOUNTER — Encounter: Payer: Self-pay | Admitting: Family Medicine

## 2023-09-11 VITALS — BP 118/88 | HR 67 | Temp 97.7°F | Ht 72.0 in | Wt 233.5 lb

## 2023-09-11 DIAGNOSIS — K219 Gastro-esophageal reflux disease without esophagitis: Secondary | ICD-10-CM

## 2023-09-11 DIAGNOSIS — Z7984 Long term (current) use of oral hypoglycemic drugs: Secondary | ICD-10-CM

## 2023-09-11 DIAGNOSIS — E1165 Type 2 diabetes mellitus with hyperglycemia: Secondary | ICD-10-CM | POA: Diagnosis not present

## 2023-09-11 DIAGNOSIS — I10 Essential (primary) hypertension: Secondary | ICD-10-CM | POA: Diagnosis not present

## 2023-09-11 DIAGNOSIS — Z7985 Long-term (current) use of injectable non-insulin antidiabetic drugs: Secondary | ICD-10-CM

## 2023-09-11 DIAGNOSIS — E782 Mixed hyperlipidemia: Secondary | ICD-10-CM

## 2023-09-11 LAB — POCT GLYCOSYLATED HEMOGLOBIN (HGB A1C): Hemoglobin A1C: 6 % — AB (ref 4.0–5.6)

## 2023-09-11 MED ORDER — METFORMIN HCL 1000 MG PO TABS: 1000.0000 mg | ORAL_TABLET | Freq: Two times a day (BID) | ORAL | 0 refills | Status: DC

## 2023-09-11 MED ORDER — SEMAGLUTIDE (2 MG/DOSE) 8 MG/3ML ~~LOC~~ SOPN: 2.0000 mg | PEN_INJECTOR | SUBCUTANEOUS | 3 refills | Status: AC

## 2023-09-11 MED ORDER — OLMESARTAN MEDOXOMIL 40 MG PO TABS: 40.0000 mg | ORAL_TABLET | Freq: Every day | ORAL | 3 refills | Status: AC

## 2023-09-11 MED ORDER — PANTOPRAZOLE SODIUM 40 MG PO TBEC: 40.0000 mg | DELAYED_RELEASE_TABLET | Freq: Two times a day (BID) | ORAL | 1 refills | Status: AC

## 2023-09-11 MED ORDER — ROSUVASTATIN CALCIUM 10 MG PO TABS: 10.0000 mg | ORAL_TABLET | Freq: Every day | ORAL | 1 refills | Status: DC

## 2023-09-11 NOTE — Assessment & Plan Note (Signed)
 Current hypertension medications:       Sig   amLODipine  (NORVASC ) 10 MG tablet (Taking) TAKE 1 TABLET BY MOUTH DAILY   chlorthalidone  (HYGROTON ) 25 MG tablet (Taking) TAKE 1 TABLET BY MOUTH DAILY   diltiazem  2 % GEL (Taking) Apply 1 Application topically 3 (three) times daily.   Metoprolol  Tartrate 37.5 MG TABS (Taking) TAKE 1 TABLET BY MOUTH 2 TIMES A DAY   sildenafil  (REVATIO ) 20 MG tablet (Taking) TAKE 3 TO 5 TABLETS BY MOUTH AS NEEDED PRIOR TO SEXUAL INTERCOURSE; DO NOT TAKE MORE THAN 1 DOSE IN 24 HOURS !   olmesartan  (BENICAR ) 40 MG tablet Take 1 tablet (40 mg total) by mouth daily.       Chronic, stable, BP is well controlled today on the above medications, will refill the olmesartan  today

## 2023-09-11 NOTE — Assessment & Plan Note (Signed)
 Pt hadn't been taking the rosuvastatin , I counseled the patient about the risk of CVD in patients with DM and he reports he will start back on the medication, ok to reduce dose to 10 mg daily. Refills sent to pharmacy

## 2023-09-11 NOTE — Assessment & Plan Note (Signed)
 A1C is much better than previous, at 6.0. Pt on Jardiance  and metformin , will increase Ozempic  to 2 mg weekly to maximize therapeutic dose.

## 2023-09-11 NOTE — Assessment & Plan Note (Signed)
Stable sx on once daily pantoprazole 40 mg. Will continue this medication.

## 2023-09-11 NOTE — Progress Notes (Signed)
 Established Patient Office Visit  Subjective   Patient ID: Michael Jacobs, male    DOB: 10/07/1970  Age: 53 y.o. MRN: [id number]  Chief Complaint  Patient presents with   Medical Management of Chronic Issues    Pt is here for follow up today on multiple medical issues.   HTN -- BP in office performed and is well controlled. He  reports no side effects to the medications, no chest pain, SOB, dizziness or headaches. He has a BP cuff at home and is checking BP regularly, reports they are in the normal range. Pt had ablation for his SVT -- states that he is not off of the metoprolol  since the ablation. No heart palpitations, no dizziness or chest pain since. States that he has been doing ok, has felt better since being off the metoprolol .  DM -- pt reports he is doing well on the current medications, states that he feels ready to increase the ozempic  to the 2 mg weekly dose. Reports no side effects so far from the   HLD-- pt reports that he hasn't been taking the rosuvastatin . He denies any side effects to the medication at this time. I counseled the patient on risk factor reduction in diabetics.   GERD -- needs refills today of the pantoprazole . He reports good control of his acid reflux symptoms, denies any side effects.     Current Outpatient Medications  Medication Instructions   albuterol  (VENTOLIN  HFA) 108 (90 Base) MCG/ACT inhaler 2 puffs, Inhalation, Every 6 hours PRN   ALPRAZolam  (XANAX ) 0.5 mg, As needed   amLODipine  (NORVASC ) 10 mg, Oral, Daily   chlorthalidone  (HYGROTON ) 25 mg, Oral, Daily   diltiazem  2 % GEL 1 Application, Topical, 3 times daily   fenofibrate  54 mg, Oral, Daily   JARDIANCE  10 MG TABS tablet TAKE 1 TABLET BY MOUTH DAILY BEFORE BREAKFAST; **MUST CALL MD FOR APPOINTMENT FOR FURTHER REFILLS   JORNAY PM 20 MG CP24 1 capsule, Daily at bedtime   metFORMIN  (GLUCOPHAGE ) 1,000 mg, Oral, 2 times daily with meals   Metoprolol  Tartrate 37.5 mg, Oral, 2 times daily    olmesartan  (BENICAR ) 40 mg, Oral, Daily   ondansetron  (ZOFRAN -ODT) 4 mg, Oral, Every 8 hours PRN   pantoprazole  (PROTONIX ) 40 mg, Oral, 2 times daily   rosuvastatin  (CRESTOR ) 10 mg, Oral, Daily   Semaglutide  (2 MG/DOSE) 2 mg, Injection, Weekly   sildenafil  (REVATIO ) 20 MG tablet TAKE 3 TO 5 TABLETS BY MOUTH AS NEEDED PRIOR TO SEXUAL INTERCOURSE; DO NOT TAKE MORE THAN 1 DOSE IN 24 HOURS !   traZODone  (DESYREL ) 25-50 mg, Oral, At bedtime PRN    Patient Active Problem List   Diagnosis Date Noted   ADD (attention deficit disorder) 08/20/2021   Pulmonary nodule 07/25/2014   Abnormal finding on CT scan 07/25/2014   Family history of colonic polyps 02/06/2014   Dysphagia, pharyngoesophageal phase 02/06/2014   Diabetes mellitus, type II (HCC) 07/20/2012   Hypertension 03/07/2012   Hyperlipidemia 03/07/2012   GERD (gastroesophageal reflux disease) 03/07/2012   Adjustment disorder with anxiety 03/07/2012      ROS    Objective:      BP 118/88 (BP Location: Left Arm, Patient Position: Sitting, Cuff Size: Large)   Pulse 67   Temp 97.7 F (36.5 C) (Oral)   Ht 6' (1.829 m)   Wt 233 lb 8 oz (105.9 kg)   SpO2 97%   BMI 31.67 kg/m     Physical Exam Vitals reviewed.  Constitutional:  Appearance: Normal appearance. He is well-groomed. He is obese.  Eyes:     Extraocular Movements: Extraocular movements intact.     Conjunctiva/sclera: Conjunctivae normal.  Neck:     Thyroid : No thyromegaly.  Cardiovascular:     Rate and Rhythm: Normal rate and regular rhythm.     Heart sounds: S1 normal and S2 normal. No murmur heard. Pulmonary:     Effort: Pulmonary effort is normal.     Breath sounds: Normal breath sounds and air entry. No rales.  Abdominal:     General: Abdomen is flat. Bowel sounds are normal.  Musculoskeletal:     Right lower leg: No edema.     Left lower leg: No edema.  Neurological:     General: No focal deficit present.     Mental Status: He is alert and  oriented to person, place, and time.     Gait: Gait is intact.  Psychiatric:        Mood and Affect: Mood and affect normal.    Diabetic Foot Exam - Simple   Simple Foot Form Diabetic Foot exam was performed with the following findings: Yes 09/11/2023 11:43 AM  Visual Inspection No deformities, no ulcerations, no other skin breakdown bilaterally: Yes Sensation Testing Intact to touch and monofilament testing bilaterally: Yes Pulse Check Posterior Tibialis and Dorsalis pulse intact bilaterally: Yes Comments         Results for orders placed or performed in visit on 09/11/23  POC HgB A1c  Result Value Ref Range   Hemoglobin A1C 6.0 (A) 4.0 - 5.6 %   HbA1c POC (<> result, manual entry)     HbA1c, POC (prediabetic range)     HbA1c, POC (controlled diabetic range)         The ASCVD Risk score (Arnett DK, et al., 2019) failed to calculate for the following reasons:   The valid total cholesterol range is 130 to 320 mg/dL    Assessment & Plan:  Type 2 diabetes mellitus with hyperglycemia, without long-term current use of insulin (HCC) Assessment & Plan: A1C is much better than previous, at 6.0. Pt on Jardiance  and metformin , will increase Ozempic  to 2 mg weekly to maximize therapeutic dose.   Orders: -     POCT glycosylated hemoglobin (Hb A1C) -     Collection capillary blood specimen -     metFORMIN  HCl; Take 1 tablet (1,000 mg total) by mouth 2 (two) times daily with a meal.  Dispense: 180 tablet; Refill: 0 -     Semaglutide  (2 MG/DOSE); Inject 2 mg as directed once a week.  Dispense: 9 mL; Refill: 3  Gastroesophageal reflux disease without esophagitis Assessment & Plan: Stable sx on once daily pantoprazole  40 mg. Will continue this medication.  Orders: -     Pantoprazole  Sodium; Take 1 tablet (40 mg total) by mouth 2 (two) times daily.  Dispense: 180 tablet; Refill: 1  Primary hypertension Assessment & Plan: Current hypertension medications:       Sig   amLODipine   (NORVASC ) 10 MG tablet (Taking) TAKE 1 TABLET BY MOUTH DAILY   chlorthalidone  (HYGROTON ) 25 MG tablet (Taking) TAKE 1 TABLET BY MOUTH DAILY   diltiazem  2 % GEL (Taking) Apply 1 Application topically 3 (three) times daily.   Metoprolol  Tartrate 37.5 MG TABS (Taking) TAKE 1 TABLET BY MOUTH 2 TIMES A DAY   sildenafil  (REVATIO ) 20 MG tablet (Taking) TAKE 3 TO 5 TABLETS BY MOUTH AS NEEDED PRIOR TO SEXUAL INTERCOURSE; DO NOT TAKE  MORE THAN 1 DOSE IN 24 HOURS !   olmesartan  (BENICAR ) 40 MG tablet Take 1 tablet (40 mg total) by mouth daily.       Chronic, stable, BP is well controlled today on the above medications, will refill the olmesartan  today  Orders: -     Olmesartan  Medoxomil; Take 1 tablet (40 mg total) by mouth daily.  Dispense: 90 tablet; Refill: 3  Mixed hyperlipidemia Assessment & Plan: Pt hadn't been taking the rosuvastatin , I counseled the patient about the risk of CVD in patients with DM and he reports he will start back on the medication, ok to reduce dose to 10 mg daily. Refills sent to pharmacy  Orders: -     Rosuvastatin  Calcium ; Take 1 tablet (10 mg total) by mouth daily.  Dispense: 90 tablet; Refill: 1     Return in about 16 weeks (around 01/01/2024) for annual physical exam -- come fasting for labs .    Aida House, MD

## 2023-09-18 ENCOUNTER — Telehealth: Payer: Self-pay

## 2023-09-18 ENCOUNTER — Other Ambulatory Visit (HOSPITAL_COMMUNITY): Payer: Self-pay

## 2023-09-18 NOTE — Telephone Encounter (Signed)
 Copied from CRM [id number]. Topic: Referral - Question >> Sep 18, 2023  3:59 PM Aisha D wrote: Reason for CRM: Noralyn Beams with [facility] is calling regarding the [facility] referral. Noralyn Beams stated that on the referral the pt is requesting to see Dr.Mann. Noralyn Beams stated that Dr.Mann is not with [facility] and the pt would have to see another provider.

## 2023-09-18 NOTE — Telephone Encounter (Signed)
 Pharmacy Patient Advocate Encounter   Received notification from Patient Pharmacy that prior authorization for Ozempic  2 is required/requested.   Insurance verification completed.   The patient is insured through [employer] .   Per test claim: Refill too soon. PA is not needed at this time. Medication was filled 09/11/23 for an 84 day supply. Next eligible fill date is 11/13/23.

## 2023-09-24 NOTE — Progress Notes (Signed)
 Cardiology Office Note:  .   Date:  09/24/2023  ID:  Michael Jacobs, DOB 1970/06/07, MRN [id number] PCP: Aida House, MD  [facility] Providers Cardiologist:  Sheryle Donning, MD Electrophysiologist:  Ardeen Kohler, MD {  History of Present Illness: Michael Jacobs   Michael Jacobs is a 53 y.o. male w/PMHx of  HTN, HLD, GERD, OSA (inoroved w/weight loss), ADHD, anxiety, DM CAD (non-obstructive by CT) SVT  He was referred to Dr. Daneil Dunker, symptomatic palpitations, discussed short bursts of atrial tachycardia Low dose BB was helping but not eliminating his symptoms. Planned for re-monitoring poss ablation (suspicion is for PV atrial tach/early AF)  April 2025 HR 61 - 214, average 90 bpm. 1263 nonsustained SVT (longest 31.1 seconds) No atrial fibrillation detected. Occasional supraventricular ectopy, 2.2%. Rare ventricular ectopy. Symptom trigger episodes correspond to SVT and PACs  08/28/23: EPS, ablation  focal atrial tachycardia originating from the crista terminalis was inducible at EP study and was ablated    Today's visit is scheduled as his post ablation visit ROS:   *** site *** symptoms *** SVT *** induced AF, none otherwise *** keep BB for BP?   Studies Reviewed: Michael Jacobs    EKG done today and reviewed by myself:  ***  08/28/23: EPS/ablation 1. Sinus rhythm upon presentation.  2. Paroxsymal focal atrial tachycardia originating from the crista terminalis was inducible at EP study and was ablated.  3. Easily induced atrial fibrillation with ablation at the mid posterolateral RA requiring cardioversion.  4. No sustained arrhythmias were inducible following ablation.  5. No early apparent complications.    ECHOCARDIOGRAM COMPLETE 07/19/2022  1. Left ventricular ejection fraction, by estimation, is 60 to 65%. The left ventricle has normal function. The left ventricle has no regional wall motion abnormalities. Left ventricular diastolic parameters were normal. 2.  Right ventricular systolic function is normal. The right ventricular size is normal. Tricuspid regurgitation signal is inadequate for assessing PA pressure. 3. The mitral valve is normal in structure. Trivial mitral valve regurgitation. No evidence of mitral stenosis. 4. The aortic valve is tricuspid. Aortic valve regurgitation is not visualized. No aortic stenosis is present. 5. Aortic dilatation noted. There is borderline dilatation of the ascending aorta, measuring 38 mm.   Comparison(s): No prior Echocardiogram.    LONG TERM MONITOR (3-14 DAYS) 05/24/2022  [name], [name], [name]. Predominant underlying rhythm was Sinus Rhythm. 1 run of Ventricular Tachycardia occurred lasting 4 beats with a max rate of 133 bpm (avg 128 bpm). 1684 Supraventricular Tachycardia runs occurred, the run with the fastest interval lasting 13.0 secs with a max rate of 218 bpm, the longest lasting 2 mins 12 secs with an avg rate of 139 bpm. Isolated SVEs were occasional (1.7%), ventricular ectopy was rare (<1%). There were 44 triggered events, which were typically associated with SVT.     CT CORONARY MORPH W/CTA COR W/SCORE 07/01/2022  IMPRESSION: 1. Coronary calcium  score of 71. This was 22 percentile for age and sex matched control.   2. Normal coronary origin with right dominance.   3. CAD-RADS 2. Mild non-obstructive CAD (25-49%). Consider non-atherosclerotic causes of chest pain. Consider preventive therapy and risk factor modification. Additional analysis with CT FFR will be submitted for soft plaque in the distal LCX.    Risk Assessment/Calculations:    Physical Exam:   VS:  There were no vitals taken for this visit.   Wt  Readings from Last 3 Encounters:  09/11/23 233 lb 8 oz (105.9 kg)  08/30/23 230 lb 4.8 oz (104.5 kg)  08/28/23 230 lb (104.3 kg)    GEN: Well nourished, well developed in no acute distress NECK: No JVD; No carotid bruits CARDIAC:  ***RRR, no murmurs, rubs, gallops RESPIRATORY:  *** CTA b/l without rales, wheezing or rhonchi  ABDOMEN: Soft, non-tender, non-distended EXTREMITIES: *** No edema; No deformity   ASSESSMENT AND PLAN: .    ATach S/p Ablation ***    {Are you ordering a CV Procedure (e.g. stress test, cath, DCCV, TEE, etc)?   Press F2        :[id number]}     Dispo: ***  Signed, Debbie Fails, PA-C

## 2023-09-26 ENCOUNTER — Encounter: Payer: Self-pay | Admitting: Physician Assistant

## 2023-09-26 ENCOUNTER — Ambulatory Visit: Attending: Physician Assistant | Admitting: Physician Assistant

## 2023-09-26 VITALS — BP 104/66 | HR 101 | Ht 72.0 in | Wt 228.0 lb

## 2023-09-26 DIAGNOSIS — I471 Supraventricular tachycardia, unspecified: Secondary | ICD-10-CM | POA: Diagnosis not present

## 2023-09-26 NOTE — Patient Instructions (Signed)
 Medication Instructions:   Your physician recommends that you continue on your current medications as directed. Please refer to the Current Medication list given to you today.   *If you need a refill on your cardiac medications before your next appointment, please call your pharmacy*  Lab Work: NONE ORDERED  TODAY     If you have labs (blood work) drawn today and your tests are completely normal, you will receive your results only by: MyChart Message (if you have MyChart) OR A paper copy in the mail If you have any lab test that is abnormal or we need to change your treatment, we will call you to review the results.    Testing/Procedures: NONE ORDERED  TODAY      Follow-Up: At [facility], you and your health needs are our priority.  As part of our continuing mission to provide you with exceptional heart care, our providers are all part of one team.  This team includes your primary Cardiologist (physician) and Advanced Practice Providers or APPs (Physician Assistants and Nurse Practitioners) who all work together to provide you with the care you need, when you need it.   Your next appointment:     6 month(s)   Provider:    You may see Ardeen Kohler, MD or one of the following Advanced Practice Providers on your designated Care Team:   Mertha Abrahams, PA-C Michael Andy Tillery, PA-C  Creighton Doffing, NP     We recommend signing up for the patient portal called MyChart.  Sign up information is provided on this After Visit Summary.  MyChart is used to connect with patients for Virtual Visits (Telemedicine).  Patients are able to view lab/test results, encounter notes, upcoming appointments, etc.  Non-urgent messages can be sent to your provider as well.   To learn more about what you can do with MyChart, go to [email or web address].   Other Instructions

## 2023-10-27 ENCOUNTER — Other Ambulatory Visit: Payer: Self-pay | Admitting: Family Medicine

## 2023-10-27 DIAGNOSIS — E782 Mixed hyperlipidemia: Secondary | ICD-10-CM

## 2023-11-02 ENCOUNTER — Telehealth: Payer: Self-pay | Admitting: Family Medicine

## 2023-11-02 NOTE — Telephone Encounter (Signed)
 Copied from CRM [id number]. Topic: Referral - Status >> Nov 02, 2023 11:55 AM Jasmin G wrote: Reason for CRM: Staff from [facility] called to let clinic know that Dr, that he was referred to back in May is not available to see the pt, they had reached about this matter before

## 2023-11-06 NOTE — Telephone Encounter (Signed)
 Spoke to the patient and he would like to see the Podiatrist.  Note sent to [facility].

## 2023-11-06 NOTE — Telephone Encounter (Signed)
 Looks like Dr. Micheal sent him to podiatry-- can we let [facility] know? Or ask the patient if he still needs to see the podiatrist first, if not then the referral can be canceled.

## 2023-11-29 ENCOUNTER — Other Ambulatory Visit: Payer: Self-pay | Admitting: Family Medicine

## 2023-11-29 DIAGNOSIS — F5104 Psychophysiologic insomnia: Secondary | ICD-10-CM

## 2023-12-08 ENCOUNTER — Other Ambulatory Visit: Payer: Self-pay | Admitting: Family Medicine

## 2023-12-08 DIAGNOSIS — E1165 Type 2 diabetes mellitus with hyperglycemia: Secondary | ICD-10-CM

## 2024-01-03 ENCOUNTER — Encounter: Payer: Self-pay | Admitting: Family Medicine

## 2024-01-03 ENCOUNTER — Ambulatory Visit: Admitting: Family Medicine

## 2024-01-03 VITALS — BP 98/62 | HR 70 | Temp 97.8°F | Ht 72.25 in | Wt 218.7 lb

## 2024-01-03 DIAGNOSIS — Z0000 Encounter for general adult medical examination without abnormal findings: Secondary | ICD-10-CM

## 2024-01-03 DIAGNOSIS — E1165 Type 2 diabetes mellitus with hyperglycemia: Secondary | ICD-10-CM

## 2024-01-03 DIAGNOSIS — M79675 Pain in left toe(s): Secondary | ICD-10-CM | POA: Diagnosis not present

## 2024-01-03 DIAGNOSIS — Z1211 Encounter for screening for malignant neoplasm of colon: Secondary | ICD-10-CM

## 2024-01-03 DIAGNOSIS — R110 Nausea: Secondary | ICD-10-CM

## 2024-01-03 DIAGNOSIS — Z23 Encounter for immunization: Secondary | ICD-10-CM

## 2024-01-03 LAB — COMPREHENSIVE METABOLIC PANEL WITH GFR
ALT: 19 U/L (ref 0–53)
AST: 17 U/L (ref 0–37)
Albumin: 4.6 g/dL (ref 3.5–5.2)
Alkaline Phosphatase: 51 U/L (ref 39–117)
BUN: 18 mg/dL (ref 6–23)
CO2: 28 meq/L (ref 19–32)
Calcium: 10.3 mg/dL (ref 8.4–10.5)
Chloride: 101 meq/L (ref 96–112)
Creatinine, Ser: 1.42 mg/dL (ref 0.40–1.50)
GFR: 57.3 mL/min — ABNORMAL LOW (ref >60.00)
Glucose, Bld: 113 mg/dL — ABNORMAL HIGH (ref 70–99)
Potassium: 3.9 meq/L (ref 3.5–5.1)
Sodium: 139 meq/L (ref 135–145)
Total Bilirubin: 0.7 mg/dL (ref 0.2–1.2)
Total Protein: 7.4 g/dL (ref 6.0–8.3)

## 2024-01-03 LAB — LIPID PANEL
Cholesterol: 170 mg/dL (ref 0–200)
HDL: 34.4 mg/dL — ABNORMAL LOW (ref >39.00)
LDL Cholesterol: 95 mg/dL (ref 0–99)
NonHDL: 135.67
Total CHOL/HDL Ratio: 5
Triglycerides: 204 mg/dL — ABNORMAL HIGH (ref 0.0–149.0)
VLDL: 40.8 mg/dL — ABNORMAL HIGH (ref 0.0–40.0)

## 2024-01-03 LAB — POCT GLYCOSYLATED HEMOGLOBIN (HGB A1C): Hemoglobin A1C: 5.6 % (ref 4.0–5.6)

## 2024-01-03 LAB — MICROALBUMIN / CREATININE URINE RATIO
Creatinine,U: 173.6 mg/dL
Microalb Creat Ratio: 6.8 mg/g (ref 0.0–30.0)
Microalb, Ur: 1.2 mg/dL (ref 0.0–1.9)

## 2024-01-03 MED ORDER — ONDANSETRON 4 MG PO TBDP: 4.0000 mg | ORAL_TABLET | Freq: Three times a day (TID) | ORAL | 5 refills | Status: AC | PRN

## 2024-01-03 NOTE — Progress Notes (Signed)
 Complete physical exam  Patient: Michael Jacobs   DOB: 06/17/1970   53 y.o. Male  MRN: [id number]  Subjective:    Chief Complaint  Patient presents with   Annual Exam    Michael Jacobs is a 53 y.o. male who presents today for a complete physical exam. He reports consuming a general diet.eating mostly low calorie and low carb, trying to eat green veggies in his diet daily. Eating good sources of protein, eats some dairy, maybe some cheese. Home exercise routine includes walking 1-2  hrs per week. He generally feels well. He reports sleeping fairly well. He does not have additional problems to discuss today.   Pt is going to go to the eye doctor soon, thinks it is in December.  Most recent fall risk assessment:    09/28/2022    9:22 AM  Fall Risk   Falls in the past year? 0  Number falls in past yr: 0  Injury with Fall? 0  Follow up Falls evaluation completed     Most recent depression screenings:    09/28/2022    9:22 AM 03/29/2022    8:04 AM  PHQ 2/9 Scores  PHQ - 2 Score 0 0  PHQ- 9 Score 0 0    Vision:Within last year and Dental: No current dental problems and Receives regular dental care  Patient Active Problem List   Diagnosis Date Noted   ADD (attention deficit disorder) 08/20/2021   Pulmonary nodule 07/25/2014   Abnormal finding on CT scan 07/25/2014   Family history of colonic polyps 02/06/2014   Dysphagia, pharyngoesophageal phase 02/06/2014   Diabetes mellitus, type II (HCC) 07/20/2012   Hypertension 03/07/2012   Hyperlipidemia 03/07/2012   GERD (gastroesophageal reflux disease) 03/07/2012   Adjustment disorder with anxiety 03/07/2012      Patient Care Team: Ozell Heron HERO, MD as PCP - General (Family Medicine) Lonni Slain, MD as PCP - Cardiology (Cardiology) Kennyth Chew, MD as PCP - Electrophysiology (Cardiology) Wonda Cy BROCKS, RD as Dietitian [facility] Medicine)   Outpatient Medications Prior to Visit  Medication Sig    albuterol  (VENTOLIN  HFA) 108 (90 Base) MCG/ACT inhaler Inhale 2 puffs into the lungs every 6 (six) hours as needed.   ALPRAZolam  (XANAX ) 0.5 MG tablet Take 0.5 mg by mouth as needed for anxiety or sleep.   amLODipine  (NORVASC ) 10 MG tablet TAKE 1 TABLET BY MOUTH DAILY   chlorthalidone  (HYGROTON ) 25 MG tablet TAKE 1 TABLET BY MOUTH DAILY   fenofibrate  54 MG tablet TAKE 1 TABLET BY MOUTH DAILY   JARDIANCE  10 MG TABS tablet TAKE 1 TABLET BY MOUTH DAILY BEFORE BREAKFAST; **MUST CALL MD FOR APPOINTMENT FOR FURTHER REFILLS   metFORMIN  (GLUCOPHAGE ) 1000 MG tablet TAKE 1 TABLET BY MOUTH 2 TIMES A DAY WITH A MEAL   Metoprolol  Tartrate 37.5 MG TABS TAKE 1 TABLET BY MOUTH 2 TIMES A DAY   olmesartan  (BENICAR ) 40 MG tablet Take 1 tablet (40 mg total) by mouth daily.   pantoprazole  (PROTONIX ) 40 MG tablet Take 1 tablet (40 mg total) by mouth 2 (two) times daily.   rosuvastatin  (CRESTOR ) 10 MG tablet Take 1 tablet (10 mg total) by mouth daily.   Semaglutide , 2 MG/DOSE, 8 MG/3ML SOPN Inject 2 mg as directed once a week.   sildenafil  (REVATIO ) 20 MG tablet TAKE 3 TO 5 TABLETS BY MOUTH AS NEEDED PRIOR TO SEXUAL INTERCOURSE; DO NOT TAKE MORE THAN 1 DOSE IN 24 HOURS !   traZODone  (DESYREL ) 50 MG  tablet TAKE 1/2 TO 1 TABLET BY MOUTH EVERY NIGHT AT BEDTIME AS NEEDED FOR SLEEP   [DISCONTINUED] ondansetron  (ZOFRAN -ODT) 4 MG disintegrating tablet Take 1 tablet (4 mg total) by mouth every 8 (eight) hours as needed for nausea or vomiting.   [DISCONTINUED] diltiazem  2 % GEL Apply 1 Application topically 3 (three) times daily.   [DISCONTINUED] JORNAY PM 20 MG CP24 Take 1 capsule by mouth at bedtime.   No facility-administered medications prior to visit.    Review of Systems  HENT:  Negative for hearing loss.   Eyes:  Negative for blurred vision.  Respiratory:  Negative for shortness of breath.   Cardiovascular:  Negative for chest pain.  Gastrointestinal: Negative.   Genitourinary: Negative.   Musculoskeletal:   Negative for back pain.  Neurological:  Negative for headaches.  Psychiatric/Behavioral:  Negative for depression.        Objective:     BP 98/62   Pulse 70   Temp 97.8 F (36.6 C) (Oral)   Ht 6' 0.25 (1.835 m)   Wt 218 lb 11.2 oz (99.2 kg)   SpO2 98%   BMI 29.46 kg/m    Physical Exam Vitals reviewed.  Constitutional:      Appearance: Normal appearance. He is well-groomed and normal weight.  HENT:     Right Ear: Tympanic membrane and ear canal normal.     Left Ear: Tympanic membrane and ear canal normal.     Mouth/Throat:     Mouth: Mucous membranes are moist.     Pharynx: No posterior oropharyngeal erythema.  Eyes:     Extraocular Movements: Extraocular movements intact.     Conjunctiva/sclera: Conjunctivae normal.  Neck:     Thyroid : No thyromegaly.  Cardiovascular:     Rate and Rhythm: Normal rate and regular rhythm.     Heart sounds: S1 normal and S2 normal. No murmur heard. Pulmonary:     Effort: Pulmonary effort is normal.     Breath sounds: Normal breath sounds and air entry. No rales.  Abdominal:     General: Abdomen is flat. Bowel sounds are normal.  Musculoskeletal:     Right lower leg: No edema.     Left lower leg: No edema.  Lymphadenopathy:     Cervical: No cervical adenopathy.  Neurological:     General: No focal deficit present.     Mental Status: He is alert and oriented to person, place, and time.     Gait: Gait is intact.  Psychiatric:        Mood and Affect: Mood and affect normal.      Results for orders placed or performed in visit on 01/03/24  POC HgB A1c  Result Value Ref Range   Hemoglobin A1C 5.6 4.0 - 5.6 %   HbA1c POC (<> result, manual entry)     HbA1c, POC (prediabetic range)     HbA1c, POC (controlled diabetic range)         Assessment & Plan:    Routine Health Maintenance and Physical Exam  Immunization History  Administered Date(s) Administered   Influenza,inj,Quad PF,6+ Mos 02/07/2013, 01/02/2014, 02/19/2015,  01/11/2016, 02/28/2017, 01/11/2019, 05/20/2020, 01/01/2021   PFIZER(Purple Top)SARS-COV-2 Vaccination 06/21/2019, 07/17/2019, 03/19/2020   Pneumococcal Polysaccharide-23 03/20/2013, 02/12/2019   Td 11/05/1991   Tdap 09/29/2014    Health Maintenance  Topic Date Due   Hepatitis C Screening  Never done   Hepatitis B Vaccines 19-59 Average Risk (1 of 3 - 19+ 3-dose series) Never done  Diabetic kidney evaluation - Urine ACR  01/11/2020   Pneumococcal Vaccine: 50+ Years (2 of 2 - PCV) 02/12/2020   Zoster Vaccines- Shingrix  (1 of 2) Never done   OPHTHALMOLOGY EXAM  03/23/2023   Colonoscopy  03/21/2024   COVID-19 Vaccine (4 - 2025-26 season) 01/19/2024 (Originally 12/11/2023)   Influenza Vaccine  07/09/2024 (Originally 11/10/2023)   HEMOGLOBIN A1C  07/02/2024   Diabetic kidney evaluation - eGFR measurement  08/22/2024   FOOT EXAM  09/10/2024   DTaP/Tdap/Td (3 - Td or Tdap) 09/28/2024   HIV Screening  Addressed   HPV VACCINES  Aged Out   Meningococcal B Vaccine  Aged Out    Discussed health benefits of physical activity, and encouraged him to engage in regular exercise appropriate for his age and condition.  Type 2 diabetes mellitus with hyperglycemia, without long-term current use of insulin (HCC) -     POCT glycosylated hemoglobin (Hb A1C) -     Collection capillary blood specimen -     Lipid panel; Future -     Comprehensive metabolic panel with GFR -     Microalbumin / creatinine urine ratio; Future  Nausea -     Ondansetron ; Take 1 tablet (4 mg total) by mouth every 8 (eight) hours as needed for nausea or vomiting.  Dispense: 20 tablet; Refill: 5  Pain of toe of left foot -     Ambulatory referral to Podiatry  Routine general medical examination at a health care facility  Colon cancer screening -     Ambulatory referral to Gastroenterology  Immunization due -     Varicella-zoster vaccine IM   General physical exam findings are normal today. I reviewed the patient's  preventative testing, immunizations, and lifestyle habits. I made appropriate recommendations and placed orders for the appropriate tests and/or vaccinations. I counseled the patient on the CDC's recommendations for healthy exercise and diet. I counseled the patient on healthy sleep habits and stress management. Handouts to reinforce the counseling were given at the conclusion of the visit.    Return in 6 months (on 07/02/2024).     Heron CHRISTELLA Sharper, MD

## 2024-01-03 NOTE — Patient Instructions (Addendum)
 Shingles vaccine series Flu shot  COVID booster  Health Maintenance, Male Adopting a healthy lifestyle and getting preventive care are important in promoting health and wellness. Ask your health care provider about: The right schedule for you to have regular tests and exams. Things you can do on your own to prevent diseases and keep yourself healthy. What should I know about diet, weight, and exercise? Eat a healthy diet  Eat a diet that includes plenty of vegetables, fruits, low-fat dairy products, and lean protein. Do not eat a lot of foods that are high in solid fats, added sugars, or sodium. Maintain a healthy weight Body mass index (BMI) is a measurement that can be used to identify possible weight problems. It estimates body fat based on height and weight. Your health care provider can help determine your BMI and help you achieve or maintain a healthy weight. Get regular exercise Get regular exercise. This is one of the most important things you can do for your health. Most adults should: Exercise for at least 150 minutes each week. The exercise should increase your heart rate and make you sweat (moderate-intensity exercise). Do strengthening exercises at least twice a week. This is in addition to the moderate-intensity exercise. Spend less time sitting. Even light physical activity can be beneficial. Watch cholesterol and blood lipids Have your blood tested for lipids and cholesterol at 51 years of age, then have this test every 5 years. You may need to have your cholesterol levels checked more often if: Your lipid or cholesterol levels are high. You are older than 51 years of age. You are at high risk for heart disease. What should I know about cancer screening? Many types of cancers can be detected early and may often be prevented. Depending on your health history and family history, you may need to have cancer screening at various ages. This may include screening for: Colorectal  cancer. Prostate cancer. Skin cancer. Lung cancer. What should I know about heart disease, diabetes, and high blood pressure? Blood pressure and heart disease High blood pressure causes heart disease and increases the risk of stroke. This is more likely to develop in people who have high blood pressure readings or are overweight. Talk with your health care provider about your target blood pressure readings. Have your blood pressure checked: Every 3-5 years if you are 40-83 years of age. Every year if you are 72 years old or older. If you are between the ages of 44 and 70 and are a current or former smoker, ask your health care provider if you should have a one-time screening for abdominal aortic aneurysm (AAA). Diabetes Have regular diabetes screenings. This checks your fasting blood sugar level. Have the screening done: Once every three years after age 37 if you are at a normal weight and have a low risk for diabetes. More often and at a younger age if you are overweight or have a high risk for diabetes. What should I know about preventing infection? Hepatitis B If you have a higher risk for hepatitis B, you should be screened for this virus. Talk with your health care provider to find out if you are at risk for hepatitis B infection. Hepatitis C Blood testing is recommended for: Everyone born from [birth year] through 1963. Anyone with known risk factors for hepatitis C. Sexually transmitted infections (STIs) You should be screened each year for STIs, including gonorrhea and chlamydia, if: You are sexually active and are younger than 51 years of age. You  are older than 51 years of age and your health care provider tells you that you are at risk for this type of infection. Your sexual activity has changed since you were last screened, and you are at increased risk for chlamydia or gonorrhea. Ask your health care provider if you are at risk. Ask your health care provider about whether you are at  high risk for HIV. Your health care provider may recommend a prescription medicine to help prevent HIV infection. If you choose to take medicine to prevent HIV, you should first get tested for HIV. You should then be tested every 3 months for as long as you are taking the medicine. Follow these instructions at home: Alcohol use Do not drink alcohol if your health care provider tells you not to drink. If you drink alcohol: Limit how much you have to 0-2 drinks a day. Know how much alcohol is in your drink. In the [place] one drink equals one 12 oz bottle of beer (355 mL), one 5 oz glass of wine (148 mL), or one 1 oz glass of hard liquor (44 mL). Lifestyle Do not use any products that contain nicotine or tobacco. These products include cigarettes, chewing tobacco, and vaping devices, such as e-cigarettes. If you need help quitting, ask your health care provider. Do not use street drugs. Do not share needles. Ask your health care provider for help if you need support or information about quitting drugs. General instructions Schedule regular health, dental, and eye exams. Stay current with your vaccines. Tell your health care provider if: You often feel depressed. You have ever been abused or do not feel safe at home. Summary Adopting a healthy lifestyle and getting preventive care are important in promoting health and wellness. Follow your health care provider's instructions about healthy diet, exercising, and getting tested or screened for diseases. Follow your health care provider's instructions on monitoring your cholesterol and blood pressure. This information is not intended to replace advice given to you by your health care provider. Make sure you discuss any questions you have with your health care provider. Document Revised: 08/17/2020 Document Reviewed: 08/17/2020 Elsevier Patient Education  2024 [employer].

## 2024-01-09 ENCOUNTER — Ambulatory Visit: Payer: Self-pay | Admitting: Family Medicine

## 2024-01-11 ENCOUNTER — Ambulatory Visit (INDEPENDENT_AMBULATORY_CARE_PROVIDER_SITE_OTHER)

## 2024-01-11 ENCOUNTER — Ambulatory Visit (INDEPENDENT_AMBULATORY_CARE_PROVIDER_SITE_OTHER): Admitting: Podiatry

## 2024-01-11 DIAGNOSIS — M2042 Other hammer toe(s) (acquired), left foot: Secondary | ICD-10-CM

## 2024-01-11 DIAGNOSIS — G5762 Lesion of plantar nerve, left lower limb: Secondary | ICD-10-CM | POA: Diagnosis not present

## 2024-01-11 DIAGNOSIS — B353 Tinea pedis: Secondary | ICD-10-CM

## 2024-01-11 MED ORDER — TERBINAFINE HCL 250 MG PO TABS: 250.0000 mg | ORAL_TABLET | Freq: Every day | ORAL | 0 refills | Status: AC

## 2024-01-12 NOTE — Progress Notes (Signed)
 Subjective:  Patient ID: Michael Jacobs, male    DOB: [birth year],  MRN: [id number] HPI Chief Complaint  Patient presents with   Foot Pain    Left foot, 4th toe. Heard a popping noise after leaning forward while doing a repair on his mower a year ago. Felt sharp/stabbing pain x15 minutes right after. Ever since then he has had to pop and move the toe around to relieve the pressure. Last A1c: 5.6, no anticoag.     53 y.o. male presents with the above complaint.   ROS: Denies fever chills nausea vomit muscle aches pains calf pain back pain chest pain shortness of breath.  Past Medical History:  Diagnosis Date   Anxiety    Asthma    Cecal ulcer    Diabetes mellitus (HCC) 2017   GERD (gastroesophageal reflux disease)    Hiatal hernia    Hypercholesterolemia    Hypertension    Past Surgical History:  Procedure Laterality Date   COLONOSCOPY  2015   EYE SURGERY     04/2020- Retina repair bilateral eyes by Dr. Alvia per patient    lasik Bilateral    POLYPECTOMY     SVT ABLATION N/A 08/28/2023   Procedure: SVT ABLATION;  Surgeon: Kennyth Chew, MD;  Location: [facility] INVASIVE CV LAB;  Service: Cardiovascular;  Laterality: N/A;   TONSILLECTOMY AND ADENOIDECTOMY     UPPER GASTROINTESTINAL ENDOSCOPY     WISDOM TOOTH EXTRACTION      Current Outpatient Medications:    albuterol  (VENTOLIN  HFA) 108 (90 Base) MCG/ACT inhaler, Inhale 2 puffs into the lungs every 6 (six) hours as needed., Disp: 8.5 g, Rfl: 2   ALPRAZolam  (XANAX ) 0.5 MG tablet, Take 0.5 mg by mouth as needed for anxiety or sleep., Disp: , Rfl:    amLODipine  (NORVASC ) 10 MG tablet, TAKE 1 TABLET BY MOUTH DAILY, Disp: 90 tablet, Rfl: 1   chlorthalidone  (HYGROTON ) 25 MG tablet, TAKE 1 TABLET BY MOUTH DAILY, Disp: 30 tablet, Rfl: 0   fenofibrate  54 MG tablet, TAKE 1 TABLET BY MOUTH DAILY, Disp: 90 tablet, Rfl: 1   JARDIANCE  10 MG TABS tablet, TAKE 1 TABLET BY MOUTH DAILY BEFORE BREAKFAST; **MUST CALL MD FOR APPOINTMENT FOR  FURTHER REFILLS, Disp: 90 tablet, Rfl: 3   Metoprolol  Tartrate 37.5 MG TABS, TAKE 1 TABLET BY MOUTH 2 TIMES A DAY, Disp: 180 tablet, Rfl: 3   olmesartan  (BENICAR ) 40 MG tablet, Take 1 tablet (40 mg total) by mouth daily., Disp: 90 tablet, Rfl: 3   ondansetron  (ZOFRAN -ODT) 4 MG disintegrating tablet, Take 1 tablet (4 mg total) by mouth every 8 (eight) hours as needed for nausea or vomiting., Disp: 20 tablet, Rfl: 5   pantoprazole  (PROTONIX ) 40 MG tablet, Take 1 tablet (40 mg total) by mouth 2 (two) times daily., Disp: 180 tablet, Rfl: 1   rosuvastatin  (CRESTOR ) 10 MG tablet, Take 1 tablet (10 mg total) by mouth daily., Disp: 90 tablet, Rfl: 1   Semaglutide , 2 MG/DOSE, 8 MG/3ML SOPN, Inject 2 mg as directed once a week., Disp: 9 mL, Rfl: 3   sildenafil  (REVATIO ) 20 MG tablet, TAKE 3 TO 5 TABLETS BY MOUTH AS NEEDED PRIOR TO SEXUAL INTERCOURSE; DO NOT TAKE MORE THAN 1 DOSE IN 24 HOURS !, Disp: 90 tablet, Rfl: 0   terbinafine (LAMISIL) 250 MG tablet, Take 1 tablet (250 mg total) by mouth daily., Disp: 30 tablet, Rfl: 0   traZODone  (DESYREL ) 50 MG tablet, TAKE 1/2 TO 1 TABLET BY MOUTH EVERY NIGHT  AT BEDTIME AS NEEDED FOR SLEEP, Disp: 90 tablet, Rfl: 1  Allergies  Allergen Reactions   Ace Inhibitors Cough   Phenergan  [Promethazine  Hcl] Anxiety   Review of Systems Objective:  There were no vitals filed for this visit.  General: Well developed, nourished, in no acute distress, alert and oriented x3   Dermatological: Skin is warm, dry and supple bilateral. Nails x 10 are well maintained; remaining integument appears unremarkable at this time. There are no open sores, no preulcerative lesions, tinea pedis bilateral  Vascular: Dorsalis Pedis artery and Posterior Tibial artery pedal pulses are 2/4 bilateral with immedate capillary fill time. Pedal hair growth present. No varicosities and no lower extremity edema present bilateral.   Neruologic: Grossly intact via light touch bilateral. Vibratory intact  via tuning fork bilateral. Protective threshold with Semmes Wienstein monofilament intact to all pedal sites bilateral. Patellar and Achilles deep tendon reflexes 2+ bilateral. No Babinski or clonus noted bilateral.  Pain on palpation third interdigital space of the left foot consistent with neuroma.  Musculoskeletal: No gross boney pedal deformities bilateral. No pain, crepitus, or limitation noted with foot and ankle range of motion bilateral. Muscular strength 5/5 in all groups tested bilateral.  Gait: Unassisted, Nonantalgic.    Radiographs:  Radiographs taken today demonstrate osseously mature individual no significant osseous abnormalities.  Assessment & Plan:   Assessment: Neuroma third interdigital space left foot.  Tinea pedis bilateral.  Plan: Injected the neuroma today 10 mg Kenalog and local anesthetic.  Tolerated procedure well without complications.  Started him on terbinafine.     Tawan Corkern T. [place], [place]

## 2024-02-12 ENCOUNTER — Encounter: Payer: Self-pay | Admitting: Podiatry

## 2024-02-12 ENCOUNTER — Telehealth: Payer: Self-pay | Admitting: Lab

## 2024-02-12 ENCOUNTER — Ambulatory Visit: Admitting: Podiatry

## 2024-02-12 DIAGNOSIS — G5762 Lesion of plantar nerve, left lower limb: Secondary | ICD-10-CM

## 2024-02-12 MED ORDER — MELOXICAM 15 MG PO TABS: 15.0000 mg | ORAL_TABLET | Freq: Every day | ORAL | 1 refills | Status: AC

## 2024-02-12 MED ORDER — BETAMETHASONE SOD PHOS & ACET 6 (3-3) MG/ML IJ SUSP
3.0000 mg | Freq: Once | INTRAMUSCULAR | Status: AC
  Administered 2024-02-12: 3 mg via INTRA_ARTICULAR

## 2024-02-12 MED ORDER — METHYLPREDNISOLONE 4 MG PO TBPK: ORAL_TABLET | ORAL | 0 refills | Status: AC

## 2024-02-12 NOTE — Progress Notes (Signed)
   Chief Complaint  Patient presents with   Neuroma    Rm8 Neuroma pain left 3rd IMS/ mostly plantar pain that started 3 days ago/no treament.    HPI: 53 y.o. male presenting today for follow-up evaluation of a Morton's neuroma to the left third intermetatarsal space.  It continues to be painful on a daily basis.  He states that today he went for a walk and had significant pain with burning sensation.  He says the injection he received last visit with Dr. Verta helped minimally  Past Medical History:  Diagnosis Date   Anxiety    Asthma    Cecal ulcer    Diabetes mellitus (HCC) 2017   GERD (gastroesophageal reflux disease)    Hiatal hernia    Hypercholesterolemia    Hypertension     Past Surgical History:  Procedure Laterality Date   COLONOSCOPY  2015   EYE SURGERY     04/2020- Retina repair bilateral eyes by Dr. Alvia per patient    lasik Bilateral    POLYPECTOMY     SVT ABLATION N/A 08/28/2023   Procedure: SVT ABLATION;  Surgeon: Kennyth Chew, MD;  Location: [facility] INVASIVE CV LAB;  Service: Cardiovascular;  Laterality: N/A;   TONSILLECTOMY AND ADENOIDECTOMY     UPPER GASTROINTESTINAL ENDOSCOPY     WISDOM TOOTH EXTRACTION      Allergies  Allergen Reactions   Ace Inhibitors Cough   Phenergan  [Promethazine  Hcl] Anxiety     Physical Exam: General: The patient is alert and oriented x3 in no acute distress.  Dermatology: Skin is warm, dry and supple bilateral lower extremities.   Vascular: Palpable pedal pulses bilaterally. Capillary refill within normal limits.  No appreciable edema.  No erythema.  Neurological: Grossly intact via light touch  Musculoskeletal Exam: No pedal deformities noted.  Tenderness with palpation noted to the third intermetatarsal space of the left foot more distal than normally encountered.  Findings seem to be consistent with a Morton's neuroma  Radiographic Exam LT foot 01/11/2024:  Normal osseous mineralization. Joint spaces preserved.  No  fractures or osseous irregularities noted.  Assessment/Plan of Care: 1. Morton's neuroma third interspace left foot  -Patient evaluated -Injection of 0.5 cc Celestone Soluspan injected into the left foot third intermetatarsal space -Prescription for Medrol  Dosepak -Prescription for meloxicam 15 mg daily after completion of the Dosepak -OTC power step insoles were dispensed to support the arch of the foot and offload pressure from the forefoot -Metatarsal pads were also applied to the insoles in the shoes to offload pressure -Recommend wide fitting shoes that do not irritate or constrict the toebox area -Return to clinic 4 weeks       Thresa EMERSON Sar, DPM [facility]  Dr. Thresa EMERSON Sar, DPM    [address] [place], [place] [address]                Office [phone]  Fax [phone]

## 2024-02-13 ENCOUNTER — Ambulatory Visit: Admitting: Podiatry

## 2024-02-21 ENCOUNTER — Telehealth (HOSPITAL_BASED_OUTPATIENT_CLINIC_OR_DEPARTMENT_OTHER): Payer: Self-pay

## 2024-02-21 NOTE — Telephone Encounter (Signed)
   Name: Michael Jacobs  DOB: 1970-09-24  MRN: [id number]  Primary Cardiologist: Shelda Bruckner, MD   Preoperative team, please contact this patient and set up a phone call appointment for further preoperative risk assessment. Please obtain consent and complete medication review. Thank you for your help.  I confirm that guidance regarding antiplatelet and oral anticoagulation therapy has been completed and, if necessary, noted below.  None requested.   I also confirmed the patient resides in the state of [place] . As per [facility] [employer] telemedicine laws, the patient must reside in the state in which the provider is licensed.    Barnie Hila, NP 02/21/2024, 11:05 AM [facility]

## 2024-02-21 NOTE — Telephone Encounter (Signed)
   Pre-operative Risk Assessment    Patient Name: Michael Jacobs  DOB: 08/12/1970 MRN: [id number]   Date of last office visit: 09/26/23 with Leverne Date of next office visit: NA   Request for Surgical Clearance    Procedure:  Colonoscopy   Date of Surgery:  Clearance 03/22/24                                 Surgeon:  Dr. Kristie Socks Group or Practice Name:  [facility] Phone number:  [phone] Fax number:  [phone]   Type of Clearance Requested:   - Medical    Type of Anesthesia:  propofol    Additional requests/questions:    Bonney Augustin JONETTA Delores   02/21/2024, 10:39 AM

## 2024-02-21 NOTE — Telephone Encounter (Signed)
 Left message to call back to schedule tele pre op appt.

## 2024-02-24 ENCOUNTER — Other Ambulatory Visit (HOSPITAL_BASED_OUTPATIENT_CLINIC_OR_DEPARTMENT_OTHER): Payer: Self-pay | Admitting: Cardiology

## 2024-02-24 DIAGNOSIS — I10 Essential (primary) hypertension: Secondary | ICD-10-CM

## 2024-02-26 ENCOUNTER — Other Ambulatory Visit: Payer: Self-pay | Admitting: Cardiology

## 2024-02-26 DIAGNOSIS — I10 Essential (primary) hypertension: Secondary | ICD-10-CM

## 2024-02-28 NOTE — Telephone Encounter (Signed)
 Requesting office sent a duplicate. We already have the preoop request in process. Our team has left a message for the pt to call back to schedule a tele preop appt.

## 2024-03-13 ENCOUNTER — Encounter: Payer: Self-pay | Admitting: Podiatry

## 2024-03-13 ENCOUNTER — Ambulatory Visit: Admitting: Podiatry

## 2024-03-13 VITALS — Ht 72.0 in | Wt 218.7 lb

## 2024-03-13 DIAGNOSIS — G5762 Lesion of plantar nerve, left lower limb: Secondary | ICD-10-CM

## 2024-03-13 NOTE — Progress Notes (Signed)
   Chief Complaint  Patient presents with   Foot Pain    Pt is here to f/u on left foot due to pain, he states the foot has felt a lot better since his last visit when he received an injection.    HPI: 53 y.o. male presenting today for follow-up evaluation of a Morton's neuroma to the left third intermetatarsal space.  States that he feels significantly better.  He no longer has near as much pain or tenderness associated to the Morton's neuroma  Past Medical History:  Diagnosis Date   Anxiety    Asthma    Cecal ulcer    Diabetes mellitus (HCC) 2017   GERD (gastroesophageal reflux disease)    Hiatal hernia    Hypercholesterolemia    Hypertension     Past Surgical History:  Procedure Laterality Date   COLONOSCOPY  2015   EYE SURGERY     04/2020- Retina repair bilateral eyes by Dr. Alvia per patient    lasik Bilateral    POLYPECTOMY     SVT ABLATION N/A 08/28/2023   Procedure: SVT ABLATION;  Surgeon: Kennyth Chew, MD;  Location: [facility] INVASIVE CV LAB;  Service: Cardiovascular;  Laterality: N/A;   TONSILLECTOMY AND ADENOIDECTOMY     UPPER GASTROINTESTINAL ENDOSCOPY     WISDOM TOOTH EXTRACTION      Allergies  Allergen Reactions   Ace Inhibitors Cough   Phenergan  [Promethazine  Hcl] Anxiety     Physical Exam: General: The patient is alert and oriented x3 in no acute distress.  Dermatology: Skin is warm, dry and supple bilateral lower extremities.   Vascular: Palpable pedal pulses bilaterally. Capillary refill within normal limits.  No appreciable edema.  No erythema.  Neurological: Grossly intact via light touch  Musculoskeletal Exam: No pedal deformities noted.  Negative for any appreciable tenderness with palpation noted to the third intermetatarsal space of the left foot more distal than normally encountered.  Findings seem to be consistent with a Morton's neuroma  Radiographic Exam LT foot 01/11/2024:  Normal osseous mineralization. Joint spaces preserved.  No  fractures or osseous irregularities noted.  Assessment/Plan of Care: 1. Morton's neuroma third interspace left foot  -Patient evaluated - Continue meloxicam  15 mg daily PRN -Offloading felt metatarsal pads were applied to the OTC power step insoles that were dispensed last visit to support the arch of the foot and offload pressure from the forefoot - With the additional metatarsal pads he noticed even more alleviation of the pressure from the forefoot.  Overall he is very satisfied with his improvement -Return to clinic PRN       Thresa EMERSON Sar, DPM [facility]  Dr. Thresa EMERSON Sar, DPM    [address] [place], [place] [address]                Office [phone]  Fax [phone]

## 2024-03-18 ENCOUNTER — Other Ambulatory Visit: Payer: Self-pay | Admitting: Family Medicine

## 2024-03-18 DIAGNOSIS — E782 Mixed hyperlipidemia: Secondary | ICD-10-CM

## 2024-03-22 LAB — HM COLONOSCOPY

## 2024-03-28 LAB — OPHTHALMOLOGY REPORT-SCANNED

## 2024-05-12 ENCOUNTER — Other Ambulatory Visit: Payer: Self-pay | Admitting: Family Medicine

## 2024-05-12 DIAGNOSIS — E1165 Type 2 diabetes mellitus with hyperglycemia: Secondary | ICD-10-CM

## 2024-07-02 ENCOUNTER — Ambulatory Visit: Admitting: Family Medicine
